# Patient Record
Sex: Female | Born: 1950 | Race: White | Hispanic: No | Smoking: Former smoker
Health system: Southern US, Community
[De-identification: ages and names within clinical notes are randomized; demographics above are authoritative.]

## PROBLEM LIST (undated history)

## (undated) ENCOUNTER — Emergency Department (HOSPITAL_COMMUNITY): Admission: EM | Discharge status: Absent without leave | Payer: Medicare Other

## (undated) DIAGNOSIS — J189 Pneumonia, unspecified organism: Secondary | ICD-10-CM

## (undated) DIAGNOSIS — D509 Iron deficiency anemia, unspecified: Secondary | ICD-10-CM

## (undated) DIAGNOSIS — E785 Hyperlipidemia, unspecified: Secondary | ICD-10-CM

## (undated) DIAGNOSIS — F3289 Other specified depressive episodes: Secondary | ICD-10-CM

## (undated) DIAGNOSIS — J449 Chronic obstructive pulmonary disease, unspecified: Secondary | ICD-10-CM

## (undated) DIAGNOSIS — G4700 Insomnia, unspecified: Secondary | ICD-10-CM

## (undated) DIAGNOSIS — G43009 Migraine without aura, not intractable, without status migrainosus: Secondary | ICD-10-CM

## (undated) DIAGNOSIS — R519 Headache, unspecified: Secondary | ICD-10-CM

## (undated) DIAGNOSIS — J309 Allergic rhinitis, unspecified: Secondary | ICD-10-CM

## (undated) DIAGNOSIS — J45909 Unspecified asthma, uncomplicated: Secondary | ICD-10-CM

## (undated) DIAGNOSIS — M810 Age-related osteoporosis without current pathological fracture: Secondary | ICD-10-CM

## (undated) DIAGNOSIS — M1990 Unspecified osteoarthritis, unspecified site: Secondary | ICD-10-CM

## (undated) DIAGNOSIS — F329 Major depressive disorder, single episode, unspecified: Secondary | ICD-10-CM

## (undated) DIAGNOSIS — I340 Nonrheumatic mitral (valve) insufficiency: Secondary | ICD-10-CM

## (undated) DIAGNOSIS — R51 Headache: Secondary | ICD-10-CM

## (undated) DIAGNOSIS — K5730 Diverticulosis of large intestine without perforation or abscess without bleeding: Secondary | ICD-10-CM

## (undated) DIAGNOSIS — F411 Generalized anxiety disorder: Secondary | ICD-10-CM

## (undated) DIAGNOSIS — E119 Type 2 diabetes mellitus without complications: Secondary | ICD-10-CM

## (undated) DIAGNOSIS — K219 Gastro-esophageal reflux disease without esophagitis: Secondary | ICD-10-CM

## (undated) DIAGNOSIS — G8929 Other chronic pain: Secondary | ICD-10-CM

## (undated) HISTORY — DX: Major depressive disorder, single episode, unspecified: F32.9

## (undated) HISTORY — DX: Generalized anxiety disorder: F41.1

## (undated) HISTORY — DX: Insomnia, unspecified: G47.00

## (undated) HISTORY — DX: Headache, unspecified: R51.9

## (undated) HISTORY — PX: COLONOSCOPY: SHX174

## (undated) HISTORY — DX: Type 2 diabetes mellitus without complications: E11.9

## (undated) HISTORY — DX: Unspecified osteoarthritis, unspecified site: M19.90

## (undated) HISTORY — DX: Nonrheumatic mitral (valve) insufficiency: I34.0

## (undated) HISTORY — DX: Allergic rhinitis, unspecified: J30.9

## (undated) HISTORY — DX: Pneumonia, unspecified organism: J18.9

## (undated) HISTORY — DX: Migraine without aura, not intractable, without status migrainosus: G43.009

## (undated) HISTORY — PX: APPENDECTOMY: SHX54

## (undated) HISTORY — PX: ABDOMINAL HYSTERECTOMY: SHX81

## (undated) HISTORY — DX: Gastro-esophageal reflux disease without esophagitis: K21.9

## (undated) HISTORY — DX: Headache: R51

## (undated) HISTORY — DX: Other specified depressive episodes: F32.89

## (undated) HISTORY — DX: Iron deficiency anemia, unspecified: D50.9

## (undated) HISTORY — DX: Other chronic pain: G89.29

## (undated) HISTORY — DX: Age-related osteoporosis without current pathological fracture: M81.0

## (undated) HISTORY — PX: CHOLECYSTECTOMY: SHX55

## (undated) HISTORY — DX: Chronic obstructive pulmonary disease, unspecified: J44.9

## (undated) HISTORY — DX: Hyperlipidemia, unspecified: E78.5

## (undated) HISTORY — DX: Diverticulosis of large intestine without perforation or abscess without bleeding: K57.30

## (undated) HISTORY — DX: Unspecified asthma, uncomplicated: J45.909

---

## 1998-02-15 ENCOUNTER — Other Ambulatory Visit: Admission: RE | Admit: 1998-02-15 | Discharge: 1998-02-15 | Payer: Self-pay | Admitting: Gynecology

## 1998-03-21 ENCOUNTER — Ambulatory Visit (HOSPITAL_COMMUNITY): Admission: RE | Admit: 1998-03-21 | Discharge: 1998-03-21 | Payer: Self-pay | Admitting: Family Medicine

## 1998-03-21 ENCOUNTER — Encounter: Payer: Self-pay | Admitting: Family Medicine

## 1998-06-15 ENCOUNTER — Ambulatory Visit (HOSPITAL_COMMUNITY): Admission: RE | Admit: 1998-06-15 | Discharge: 1998-06-15 | Payer: Self-pay | Admitting: Family Medicine

## 1998-06-15 ENCOUNTER — Encounter: Payer: Self-pay | Admitting: Family Medicine

## 1998-10-16 ENCOUNTER — Ambulatory Visit (HOSPITAL_COMMUNITY): Admission: RE | Admit: 1998-10-16 | Discharge: 1998-10-16 | Payer: Self-pay | Admitting: Family Medicine

## 1998-10-16 ENCOUNTER — Encounter: Payer: Self-pay | Admitting: Family Medicine

## 1999-02-19 ENCOUNTER — Ambulatory Visit (HOSPITAL_COMMUNITY): Admission: RE | Admit: 1999-02-19 | Discharge: 1999-02-19 | Payer: Self-pay | Admitting: Family Medicine

## 1999-02-19 ENCOUNTER — Encounter: Payer: Self-pay | Admitting: Family Medicine

## 1999-07-16 ENCOUNTER — Other Ambulatory Visit: Admission: RE | Admit: 1999-07-16 | Discharge: 1999-07-16 | Payer: Self-pay | Admitting: Gynecology

## 2000-10-14 ENCOUNTER — Other Ambulatory Visit: Admission: RE | Admit: 2000-10-14 | Discharge: 2000-10-14 | Payer: Self-pay | Admitting: Gynecology

## 2000-12-09 ENCOUNTER — Encounter: Payer: Self-pay | Admitting: Family Medicine

## 2000-12-09 ENCOUNTER — Ambulatory Visit (HOSPITAL_COMMUNITY): Admission: RE | Admit: 2000-12-09 | Discharge: 2000-12-09 | Payer: Self-pay | Admitting: Family Medicine

## 2000-12-25 ENCOUNTER — Encounter: Payer: Self-pay | Admitting: Family Medicine

## 2000-12-25 ENCOUNTER — Ambulatory Visit (HOSPITAL_COMMUNITY): Admission: RE | Admit: 2000-12-25 | Discharge: 2000-12-25 | Payer: Self-pay | Admitting: Family Medicine

## 2001-05-22 ENCOUNTER — Encounter: Payer: Self-pay | Admitting: Family Medicine

## 2001-05-22 ENCOUNTER — Ambulatory Visit (HOSPITAL_COMMUNITY): Admission: RE | Admit: 2001-05-22 | Discharge: 2001-05-22 | Payer: Self-pay | Admitting: Family Medicine

## 2001-10-15 ENCOUNTER — Other Ambulatory Visit: Admission: RE | Admit: 2001-10-15 | Discharge: 2001-10-15 | Payer: Self-pay | Admitting: Gynecology

## 2002-10-18 ENCOUNTER — Other Ambulatory Visit: Admission: RE | Admit: 2002-10-18 | Discharge: 2002-10-18 | Payer: Self-pay | Admitting: Gynecology

## 2003-08-20 ENCOUNTER — Ambulatory Visit (HOSPITAL_COMMUNITY): Admission: RE | Admit: 2003-08-20 | Discharge: 2003-08-20 | Payer: Self-pay | Admitting: Family Medicine

## 2004-03-02 ENCOUNTER — Other Ambulatory Visit: Admission: RE | Admit: 2004-03-02 | Discharge: 2004-03-02 | Payer: Self-pay | Admitting: Gynecology

## 2004-04-24 ENCOUNTER — Ambulatory Visit (HOSPITAL_COMMUNITY): Admission: RE | Admit: 2004-04-24 | Discharge: 2004-04-24 | Payer: Self-pay | Admitting: Family Medicine

## 2005-05-09 ENCOUNTER — Other Ambulatory Visit: Admission: RE | Admit: 2005-05-09 | Discharge: 2005-05-09 | Payer: Self-pay | Admitting: Gynecology

## 2006-05-08 ENCOUNTER — Ambulatory Visit (HOSPITAL_COMMUNITY): Admission: RE | Admit: 2006-05-08 | Discharge: 2006-05-08 | Payer: Self-pay | Admitting: Family Medicine

## 2006-05-28 ENCOUNTER — Ambulatory Visit: Payer: Self-pay | Admitting: Internal Medicine

## 2006-06-18 ENCOUNTER — Ambulatory Visit: Payer: Self-pay | Admitting: Internal Medicine

## 2006-06-24 ENCOUNTER — Ambulatory Visit: Payer: Self-pay | Admitting: Gastroenterology

## 2006-07-02 ENCOUNTER — Ambulatory Visit: Payer: Self-pay | Admitting: Gastroenterology

## 2006-07-02 LAB — HM COLONOSCOPY

## 2006-07-08 ENCOUNTER — Ambulatory Visit: Payer: Self-pay | Admitting: Internal Medicine

## 2006-07-08 ENCOUNTER — Other Ambulatory Visit: Admission: RE | Admit: 2006-07-08 | Discharge: 2006-07-08 | Payer: Self-pay | Admitting: Gynecology

## 2006-07-08 LAB — CONVERTED CEMR LAB
AST: 20 units/L (ref 0–37)
Albumin: 3.9 g/dL (ref 3.5–5.2)
Alkaline Phosphatase: 78 units/L (ref 39–117)
HDL: 67.7 mg/dL (ref >39.0)
Total Bilirubin: 0.6 mg/dL (ref 0.3–1.2)
Total CHOL/HDL Ratio: 2.4
Total CK: 103 units/L (ref 7–177)
Total Protein: 7.2 g/dL (ref 6.0–8.3)

## 2006-07-23 ENCOUNTER — Ambulatory Visit: Payer: Self-pay | Admitting: Internal Medicine

## 2007-01-24 ENCOUNTER — Encounter: Payer: Self-pay | Admitting: Internal Medicine

## 2007-01-24 DIAGNOSIS — G43009 Migraine without aura, not intractable, without status migrainosus: Secondary | ICD-10-CM | POA: Insufficient documentation

## 2007-01-24 DIAGNOSIS — J309 Allergic rhinitis, unspecified: Secondary | ICD-10-CM

## 2007-01-24 DIAGNOSIS — F329 Major depressive disorder, single episode, unspecified: Secondary | ICD-10-CM

## 2007-01-26 DIAGNOSIS — E785 Hyperlipidemia, unspecified: Secondary | ICD-10-CM | POA: Insufficient documentation

## 2007-02-24 ENCOUNTER — Ambulatory Visit: Payer: Self-pay | Admitting: Internal Medicine

## 2007-04-02 ENCOUNTER — Ambulatory Visit: Payer: Self-pay | Admitting: Internal Medicine

## 2007-04-02 LAB — CONVERTED CEMR LAB
Basophils Absolute: 0.3 10*3/uL — ABNORMAL HIGH (ref 0.0–0.1)
Basophils Relative: 4.5 % — ABNORMAL HIGH (ref 0.0–1.0)
CO2: 26 meq/L (ref 19–32)
Chloride: 104 meq/L (ref 96–112)
Eosinophils Absolute: 0.2 10*3/uL (ref 0.0–0.6)
Eosinophils Relative: 2.1 % (ref 0.0–5.0)
GFR calc Af Amer: 112 mL/min
Lymphocytes Relative: 30.9 % (ref 12.0–46.0)
Monocytes Relative: 5 % (ref 3.0–11.0)
Neutro Abs: 4.2 10*3/uL (ref 1.4–7.7)
Neutrophils Relative %: 57.5 % (ref 43.0–77.0)
RDW: 12.1 % (ref 11.5–14.6)
Sodium: 140 meq/L (ref 135–145)
TSH: 2.35 microintl units/mL (ref 0.35–5.50)

## 2007-04-03 ENCOUNTER — Encounter: Payer: Self-pay | Admitting: Internal Medicine

## 2007-04-30 ENCOUNTER — Ambulatory Visit: Payer: Self-pay | Admitting: Internal Medicine

## 2007-05-11 ENCOUNTER — Encounter: Payer: Self-pay | Admitting: Internal Medicine

## 2007-05-11 ENCOUNTER — Ambulatory Visit: Payer: Self-pay

## 2007-06-02 ENCOUNTER — Telehealth (INDEPENDENT_AMBULATORY_CARE_PROVIDER_SITE_OTHER): Payer: Self-pay | Admitting: *Deleted

## 2007-07-31 ENCOUNTER — Telehealth: Payer: Self-pay | Admitting: Internal Medicine

## 2007-08-06 ENCOUNTER — Encounter: Payer: Self-pay | Admitting: Internal Medicine

## 2007-11-17 ENCOUNTER — Ambulatory Visit: Payer: Self-pay | Admitting: Internal Medicine

## 2007-11-17 DIAGNOSIS — D509 Iron deficiency anemia, unspecified: Secondary | ICD-10-CM

## 2007-11-17 DIAGNOSIS — I10 Essential (primary) hypertension: Secondary | ICD-10-CM | POA: Insufficient documentation

## 2007-11-17 DIAGNOSIS — G4700 Insomnia, unspecified: Secondary | ICD-10-CM

## 2007-11-17 DIAGNOSIS — F411 Generalized anxiety disorder: Secondary | ICD-10-CM | POA: Insufficient documentation

## 2007-11-17 DIAGNOSIS — K5730 Diverticulosis of large intestine without perforation or abscess without bleeding: Secondary | ICD-10-CM | POA: Insufficient documentation

## 2008-01-04 ENCOUNTER — Telehealth (INDEPENDENT_AMBULATORY_CARE_PROVIDER_SITE_OTHER): Payer: Self-pay | Admitting: *Deleted

## 2008-01-08 ENCOUNTER — Ambulatory Visit: Payer: Self-pay | Admitting: Internal Medicine

## 2008-04-14 ENCOUNTER — Ambulatory Visit: Payer: Self-pay | Admitting: Internal Medicine

## 2008-05-06 ENCOUNTER — Ambulatory Visit: Payer: Self-pay | Admitting: Internal Medicine

## 2008-05-06 DIAGNOSIS — J209 Acute bronchitis, unspecified: Secondary | ICD-10-CM

## 2008-05-10 ENCOUNTER — Observation Stay (HOSPITAL_COMMUNITY): Admission: EM | Admit: 2008-05-10 | Discharge: 2008-05-11 | Payer: Self-pay | Admitting: Emergency Medicine

## 2008-05-10 ENCOUNTER — Ambulatory Visit: Payer: Self-pay | Admitting: Internal Medicine

## 2008-05-10 ENCOUNTER — Telehealth: Payer: Self-pay | Admitting: Internal Medicine

## 2008-05-10 ENCOUNTER — Ambulatory Visit: Payer: Self-pay | Admitting: Cardiovascular Disease

## 2008-05-11 ENCOUNTER — Encounter: Payer: Self-pay | Admitting: Internal Medicine

## 2008-05-18 ENCOUNTER — Ambulatory Visit: Payer: Self-pay | Admitting: Internal Medicine

## 2008-06-07 ENCOUNTER — Ambulatory Visit: Payer: Self-pay | Admitting: Pulmonary Disease

## 2008-06-07 DIAGNOSIS — R0602 Shortness of breath: Secondary | ICD-10-CM

## 2008-06-14 ENCOUNTER — Ambulatory Visit: Payer: Self-pay | Admitting: Pulmonary Disease

## 2008-06-16 ENCOUNTER — Ambulatory Visit (HOSPITAL_COMMUNITY): Admission: RE | Admit: 2008-06-16 | Discharge: 2008-06-16 | Payer: Self-pay | Admitting: Pulmonary Disease

## 2008-07-19 ENCOUNTER — Ambulatory Visit: Payer: Self-pay | Admitting: Pulmonary Disease

## 2008-07-19 DIAGNOSIS — J45991 Cough variant asthma: Secondary | ICD-10-CM

## 2008-08-08 ENCOUNTER — Encounter: Payer: Self-pay | Admitting: Internal Medicine

## 2008-08-11 ENCOUNTER — Telehealth (INDEPENDENT_AMBULATORY_CARE_PROVIDER_SITE_OTHER): Payer: Self-pay | Admitting: *Deleted

## 2008-11-03 ENCOUNTER — Telehealth (INDEPENDENT_AMBULATORY_CARE_PROVIDER_SITE_OTHER): Payer: Self-pay | Admitting: *Deleted

## 2008-12-05 ENCOUNTER — Encounter: Payer: Self-pay | Admitting: Internal Medicine

## 2009-01-25 ENCOUNTER — Ambulatory Visit: Payer: Self-pay | Admitting: Internal Medicine

## 2009-01-26 LAB — CONVERTED CEMR LAB
ALT: 28 units/L (ref 0–35)
Alkaline Phosphatase: 88 units/L (ref 39–117)
Calcium: 9.3 mg/dL (ref 8.4–10.5)
Cholesterol: 136 mg/dL (ref 0–200)
GFR calc non Af Amer: 68.5 mL/min (ref >60)
HDL: 64.6 mg/dL (ref >39.00)
Hemoglobin, Urine: NEGATIVE
Ketones, ur: NEGATIVE mg/dL
LDL Cholesterol: 45 mg/dL (ref 0–99)
Leukocytes, UA: NEGATIVE
Lymphs Abs: 2.3 10*3/uL (ref 0.7–4.0)
MCHC: 33.9 g/dL (ref 30.0–36.0)
Monocytes Absolute: 0.4 10*3/uL (ref 0.1–1.0)
Neutro Abs: 4.1 10*3/uL (ref 1.4–7.7)
Neutrophils Relative %: 57.7 % (ref 43.0–77.0)
Platelets: 292 10*3/uL (ref 150.0–400.0)
Potassium: 3.6 meq/L (ref 3.5–5.1)
RDW: 12.9 % (ref 11.5–14.6)
Sodium: 140 meq/L (ref 135–145)
Specific Gravity, Urine: >=1.03 (ref 1.000–1.030)
TSH: 1.75 microintl units/mL (ref 0.35–5.50)
Total Bilirubin: 0.7 mg/dL (ref 0.3–1.2)
Total CHOL/HDL Ratio: 2
Urine Glucose: NEGATIVE mg/dL
WBC: 7 10*3/uL (ref 4.5–10.5)

## 2009-02-03 ENCOUNTER — Encounter (INDEPENDENT_AMBULATORY_CARE_PROVIDER_SITE_OTHER): Payer: Self-pay | Admitting: *Deleted

## 2009-02-15 ENCOUNTER — Encounter: Payer: Self-pay | Admitting: Internal Medicine

## 2009-07-26 ENCOUNTER — Ambulatory Visit: Payer: Self-pay | Admitting: Internal Medicine

## 2009-07-26 DIAGNOSIS — R079 Chest pain, unspecified: Secondary | ICD-10-CM

## 2009-07-26 DIAGNOSIS — J069 Acute upper respiratory infection, unspecified: Secondary | ICD-10-CM | POA: Insufficient documentation

## 2009-07-27 ENCOUNTER — Encounter: Payer: Self-pay | Admitting: Internal Medicine

## 2009-07-27 ENCOUNTER — Telehealth: Payer: Self-pay | Admitting: Internal Medicine

## 2009-08-01 ENCOUNTER — Encounter: Payer: Self-pay | Admitting: Internal Medicine

## 2009-08-14 ENCOUNTER — Telehealth: Payer: Self-pay | Admitting: Internal Medicine

## 2009-11-01 ENCOUNTER — Telehealth: Payer: Self-pay | Admitting: Internal Medicine

## 2009-11-01 ENCOUNTER — Ambulatory Visit: Payer: Self-pay | Admitting: Internal Medicine

## 2009-11-29 ENCOUNTER — Telehealth: Payer: Self-pay | Admitting: Internal Medicine

## 2009-12-01 ENCOUNTER — Ambulatory Visit: Payer: Self-pay | Admitting: Internal Medicine

## 2009-12-01 DIAGNOSIS — S93409A Sprain of unspecified ligament of unspecified ankle, initial encounter: Secondary | ICD-10-CM | POA: Insufficient documentation

## 2009-12-01 DIAGNOSIS — R059 Cough, unspecified: Secondary | ICD-10-CM | POA: Insufficient documentation

## 2009-12-01 DIAGNOSIS — R05 Cough: Secondary | ICD-10-CM

## 2010-02-06 ENCOUNTER — Ambulatory Visit: Payer: Self-pay | Admitting: Internal Medicine

## 2010-03-19 ENCOUNTER — Ambulatory Visit: Payer: Self-pay | Admitting: Internal Medicine

## 2010-03-19 ENCOUNTER — Encounter (INDEPENDENT_AMBULATORY_CARE_PROVIDER_SITE_OTHER): Payer: Self-pay | Admitting: *Deleted

## 2010-03-19 DIAGNOSIS — R062 Wheezing: Secondary | ICD-10-CM

## 2010-04-04 ENCOUNTER — Telehealth: Payer: Self-pay | Admitting: Internal Medicine

## 2010-04-06 ENCOUNTER — Telehealth: Payer: Self-pay | Admitting: Internal Medicine

## 2010-04-06 ENCOUNTER — Encounter: Payer: Self-pay | Admitting: Internal Medicine

## 2010-04-06 ENCOUNTER — Ambulatory Visit: Payer: Self-pay | Admitting: Internal Medicine

## 2010-04-06 LAB — CONVERTED CEMR LAB
BUN: 13 mg/dL (ref 6–23)
Basophils Absolute: 0.1 10*3/uL (ref 0.0–0.1)
CO2: 33 meq/L — ABNORMAL HIGH (ref 19–32)
Calcium: 9.5 mg/dL (ref 8.4–10.5)
Chloride: 103 meq/L (ref 96–112)
Creatinine, Ser: 0.8 mg/dL (ref 0.4–1.2)
Eosinophils Absolute: 0.2 10*3/uL (ref 0.0–0.7)
Eosinophils Relative: 2.6 % (ref 0.0–5.0)
HCT: 38.9 % (ref 36.0–46.0)
Hemoglobin, Urine: NEGATIVE
Leukocytes, UA: NEGATIVE
Lymphocytes Relative: 34.8 % (ref 12.0–46.0)
MCV: 88.4 fL (ref 78.0–100.0)
Monocytes Relative: 7.5 % (ref 3.0–12.0)
Neutro Abs: 4.8 10*3/uL (ref 1.4–7.7)
Nitrite: NEGATIVE
Saturation Ratios: 16.6 % — ABNORMAL LOW (ref 20.0–50.0)
Sodium: 143 meq/L (ref 135–145)
Total Protein, Urine: NEGATIVE mg/dL
Transferrin: 280.4 mg/dL (ref 212.0–360.0)
Urine Glucose: NEGATIVE mg/dL
VLDL: 67.8 mg/dL — ABNORMAL HIGH (ref 0.0–40.0)

## 2010-04-09 ENCOUNTER — Encounter: Payer: Self-pay | Admitting: Internal Medicine

## 2010-04-10 ENCOUNTER — Ambulatory Visit: Payer: Self-pay | Admitting: Internal Medicine

## 2010-04-11 ENCOUNTER — Encounter: Payer: Self-pay | Admitting: Internal Medicine

## 2010-04-18 ENCOUNTER — Telehealth (INDEPENDENT_AMBULATORY_CARE_PROVIDER_SITE_OTHER): Payer: Self-pay | Admitting: Radiology

## 2010-04-23 ENCOUNTER — Encounter: Payer: Self-pay | Admitting: Internal Medicine

## 2010-04-23 ENCOUNTER — Ambulatory Visit: Payer: Self-pay

## 2010-04-23 ENCOUNTER — Ambulatory Visit (HOSPITAL_COMMUNITY): Admission: RE | Admit: 2010-04-23 | Discharge: 2010-04-23 | Payer: Self-pay | Admitting: Internal Medicine

## 2010-04-23 ENCOUNTER — Encounter: Payer: Self-pay | Admitting: Cardiology

## 2010-04-23 ENCOUNTER — Ambulatory Visit: Payer: Self-pay | Admitting: Internal Medicine

## 2010-04-23 ENCOUNTER — Encounter (HOSPITAL_COMMUNITY)
Admission: RE | Admit: 2010-04-23 | Discharge: 2010-06-26 | Payer: Self-pay | Source: Home / Self Care | Attending: Internal Medicine | Admitting: Internal Medicine

## 2010-04-24 ENCOUNTER — Ambulatory Visit: Payer: Self-pay | Admitting: Internal Medicine

## 2010-04-25 ENCOUNTER — Ambulatory Visit: Payer: Self-pay | Admitting: Infectious Diseases

## 2010-04-25 LAB — CONVERTED CEMR LAB
Basophils Relative: 0 % (ref 0–1)
Compl, Total (CH50): >60 — ABNORMAL HIGH (ref 31–60)
Crypto Ag: NEGATIVE
Eosinophils Absolute: 0.2 10*3/uL (ref 0.0–0.7)
HCT: 40.8 % (ref 36.0–46.0)
Hemoglobin: 13.1 g/dL (ref 12.0–15.0)
IgA: 234 mg/dL (ref 68–378)
Lymphocytes Relative: 36 % (ref 12–46)
MCHC: 32.1 g/dL (ref 30.0–36.0)
Neutro Abs: 3.7 10*3/uL (ref 1.7–7.7)
RBC: 4.56 M/uL (ref 3.87–5.11)
WBC: 6.9 10*3/uL (ref 4.0–10.5)

## 2010-04-27 ENCOUNTER — Ambulatory Visit: Payer: Self-pay | Admitting: Cardiovascular Disease

## 2010-04-27 ENCOUNTER — Telehealth: Payer: Self-pay | Admitting: Internal Medicine

## 2010-05-16 ENCOUNTER — Telehealth: Payer: Self-pay | Admitting: Internal Medicine

## 2010-06-04 ENCOUNTER — Ambulatory Visit
Admission: RE | Admit: 2010-06-04 | Discharge: 2010-06-04 | Payer: Self-pay | Source: Home / Self Care | Attending: Infectious Diseases | Admitting: Infectious Diseases

## 2010-06-28 NOTE — Medication Information (Signed)
Summary: Prior Authorization/medco  Prior Authorization/medco   Imported By: Lester [place] 08/03/2009 12:29:03  _____________________________________________________________________  External Attachment:    Type:   Image     Comment:   External Document

## 2010-06-28 NOTE — Progress Notes (Signed)
Summary: Fexofenadine PA  Phone Note From Pharmacy   Summary of Call: PA request--Fexofenadine. Would you like to change pt to a form of Loratadine or Cetrizine or initiate PA? Please advise. Initial call taken by: Lucious Groves CMA,  July 27, 2009 12:09 PM  Follow-up for Phone Call        ok to initiate PA - to [facility] Follow-up by: Corwin Levins MD,  July 27, 2009 5:33 PM  Additional Follow-up for Phone Call Additional follow up Details #1::        Forms received,pending completion. Additional Follow-up by: Lucious Groves,  July 28, 2009 10:28 AM    Additional Follow-up for Phone Call Additional follow up Details #2::    PA has been completed and faxed. We will wait for insurance company reply. Follow-up by: Lucious Groves,  August 01, 2009 9:38 AM   Appended Document: Fexofenadine PA Per [email or web address] prescription is approved until 2012.

## 2010-06-28 NOTE — Assessment & Plan Note (Signed)
Summary: Pulmonary consultation/ cough > Sinus ct next    Copy to:  Dr. Jonny Ruiz Primary [name]/Referring [name]:  Dr. Jonny Ruiz  CC:  CP and Dyspnea.  History of Present Illness: 89 yowf quit smoking around 2000 with no tendency to bronchitis .   April 24, 2010  1st pulmonary office eval cc never compltely better x 2 years when had illness in [place] always cough some mostly dry, ex tol not back to normal more due to fatigue than sob,  cough is better while sleeping,  assoc with chest pain with coughing = front and back slt worse R than Left,  assoc with  occ streaky hemoptysis x sev weeks. Pt denies any significant sore throat, dysphagia, itching, sneezing,  nasal congestion or excess secretions,  fever, chills, sweats, unintended wt loss, pleuritic or exertional cp,  variability  in activity tolerance  orthopnea pnd or leg swelling. Pt also denies any obvious fluctuation in symptoms with weather or environmental change or other alleviating or aggravating factors.       Current Medications (verified): 1)  Simvastatin 40 Mg Tabs (Simvastatin) .Marland Kitchen.. 1 By Mouth Once Daily 2)  Fiorinal 50-325-40 Mg  Caps (Butalbital-Aspirin-Caffeine) .Marland Kitchen.. 1 By Mouth Once Daily As Needed 3)  Amitriptyline Hcl 50 Mg  Tabs (Amitriptyline Hcl) .Marland Kitchen.. 1 By Mouth At Bedtime As Needed- 4)  Fexofenadine Hcl 180 Mg Tabs (Fexofenadine Hcl) .Marland Kitchen.. 1 By Mouth Once Daily As Needed  Allergies (verified): 1)  ! * Trazadone  Past History:  Past Medical History: Allergic rhinitis Depression Migraine Hyperlipidemia Hypertension Diverticulosis, colon [name] Anxiety Mild mitral regurgitation, Echo 05/11/08 Dyspnea/ cough..................................................Marland KitchenWert      - Neg w/u in 2008       - PFT's 04/30/07 FEV1  2.54 (115%) ratio 78,  DLCO 83%  Family History: heart disease in father and brother Negative for respiratory diseases or atopy   Social History: Former Smoker. Quit in 1999.   Smoked x 20 yrs up 1 ppd. Admin assistant for [employer] Divorced 3 children Alcohol use-yes Drug use-no  Review of Systems       The patient complains of shortness of breath with activity, shortness of breath at rest, productive cough, non-productive cough, coughing up blood, chest pain, and headaches.  The patient denies irregular heartbeats, acid heartburn, indigestion, loss of appetite, weight change, abdominal pain, difficulty swallowing, sore throat, tooth/dental problems, nasal congestion/difficulty breathing through nose, sneezing, itching, ear ache, anxiety, depression, hand/feet swelling, joint stiffness or pain, rash, change in color of mucus, and fever.    Vital Signs:  Patient profile:   60 year old female Weight:      146.38 pounds O2 Sat:      96 % on Room air Temp:     97.8 degrees F oral Pulse rate:   83 / minute BP sitting:   120 / 60  (left arm)  Vitals Entered By: Vernie Murders (April 24, 2010 11:40 AM)  O2 Flow:  Room air  Physical Exam  Additional Exam:  wt 141 > 146 April 24, 2010 HEENT: nl dentition, turbinates, and orophanx. Nl external ear canals without cough reflex NECK :  without JVD/Nodes/TM/ nl carotid upstrokes bilaterally LUNGS: no acc muscle use, clear to A and P bilaterally without cough on insp or exp maneuvers CV:  RRR  no s3 or murmur or increase in P2, no edema  ABD:  soft and nontender with nl excursion in the supine position. No bruits or organomegaly, bowel sounds nl MS:  warm without deformities, calf tenderness, cyanosis or clubbing SKIN: warm and dry without lesions   NEURO:  alert, approp, no deficits   Sed Rate                  18 mm/hr                    0-22  CXR  Procedure date:  04/24/2010  Findings:      Comparison: 04/06/2010   Findings: Heart and mediastinal contours are within normal limits. No focal opacities or effusions.  No acute bony abnormality.   IMPRESSION: No active disease.  Impression &  Recommendations:  Problem # 1:  COUGH (ICD-786.2) The most common causes of chronic cough in immunocompetent adults include: upper airway cough syndrome (UACS), previously referred to as postnasal drip syndrome,  caused by variety of rhinosinus conditions; (2) asthma; (3) GERD; (4) chronic bronchitis from cigarette smoking or other inhaled environmental irritants; (5) nonasthmatic eosinophilic bronchitis; and (6) bronchiectasis. These conditions, singly or in combination, have accounted for up to 94% of the causes of chronic cough in prospective studies.   This is most c/w  Classic Upper airway cough syndrome, so named because it's frequently impossible to sort out how much is  CR/sinusitis with freq throat clearing (which can be related to primary GERD)   vs  causing  secondary extra esophageal GERD from wide swings in gastric pressure that occur with throat clearing, promoting self use of mint and menthol lozenges that reduce the lower esophageal sphincter tone and exacerbate the problem further These are the same pts who not infrequently have failed to tolerate ace inhibitors,  dry powder inhalers or biphosphonates or report having reflux symptoms that don't respond to standard doses of PPI   Rec proceed with sinus ct and GERD Diet / allergy w/u  Problem # 2:  ASTHMA (ICD-493.90) When respiratory symptoms begin well after a patient reports complete smoking cessation,  it is very hard to "blame" primary airways dz as the cause   ie it doesn't make any more sense than hearing a  [employer]SC[employer] driver wrecked his car while driving his kids to school or a Careers adviser sliced his hand off carving [place].  Once the high risk activity stops,  the symptoms should not suddenly erupt.  If so, the differential diagnosis should include  obesity/deconditioning,  LPR/Reflux, sinusitis,  CHF, or side effect of medications esp ace, biphophonates, beta blockers etc.  Strongly doubt this is asthma and no evidence of copd by prev  w/u 8 years p she quit smoking so no need for rx - could do methacholine challenge as part of the w/u for cough but not a first step for sure  Other Orders: T-Allergy Profile [place], [place], [place], [place], [place] [phone]) TLB-Sedimentation Rate (ESR) (85652-ESR) Consultation Level V (99245) T-2 View CXR (71020TC) Misc. Referral (Misc. Ref)  Patient Instructions: 1)  Keep appointment with Dr Ninetta Lights 2)  See Patient Care Coordinator before leaving for sinus ct scan 3)  GERD (REFLUX)  is a common cause of respiratory symptoms. It commonly presents without heartburn and can be treated with medication, but also with lifestyle changes including avoidance of late meals, excessive alcohol, smoking cessation, and avoid fatty foods, chocolate, peppermint, colas, red wine, and acidic juices such as orange juice. NO MINT OR MENTHOL PRODUCTS SO NO COUGH DROPS  4)  USE SUGARLESS CANDY INSTEAD (jolley ranchers)  5)  NO OIL BASED VITAMINS

## 2010-06-28 NOTE — Progress Notes (Signed)
Summary: referral  Phone Note Call from Patient Call back at [phone]   Caller: Patient Summary of Call: pt is requesting a referral to pulmonary.  Ferdie Ping called her and said she couldn't get into infectious disease until 04/25/10.  Initial call taken by: Alysia Penna,  April 06, 2010 8:46 AM  Follow-up for Phone Call        ok to refer to pulm which I think is likely more approp, then could cancel ID appt if felt after that not needed Follow-up by: Corwin Levins MD,  April 06, 2010 8:56 AM  Additional Follow-up for Phone Call Additional follow up Details #1::        Pt informed of appt, will expect call from [facility] with appt info Additional Follow-up by: Alysia Penna,  April 06, 2010 9:15 AM

## 2010-06-28 NOTE — Assessment & Plan Note (Signed)
Summary: fatigue,vomiting-lb   Vital Signs:  Patient profile:   60 year old female Height:      61 inches Weight:      145.50 pounds BMI:     27.59 O2 Sat:      96 % on Room air Temp:     98.4 degrees F oral Pulse rate:   92 / minute BP sitting:   114 / 76  (left arm) Cuff size:   regular  Vitals Entered ByZella Ball Ewing (July 26, 2009 3:05 PM)  O2 Flow:  Room air CC: fatigue, right shoulder pain/RE   Primary Care Provider:  Dr. Jonny Ruiz  CC:  fatigue and right shoulder pain/RE.  History of Present Illness: here with 2-3 days acute onset moderate fatigue, low grade temp, sinus congestion acute greenish on more chronic nasal allergic clearish d/c;  as well as mild ST, with occasional chest achy pain to the right upper chest with tenderness at the sight, non pleuritic;  not worse with right shoudler movement  and no other neck or upper back or shoulder pain.  Pt denies other CP, sob, doe, wheezing, orthopnea, pnd, worsening LE edema, palps, dizziness or syncope .  Pt denies new neuro symptoms such as headache, facial or extremity weakness   Problems Prior to Update: 1)  Chest Pain  (ICD-786.50) 2)  Uri  (ICD-465.9) 3)  Preventive Health Care  (ICD-V70.0) 4)  Asthma  (ICD-493.90) 5)  Dyspnea  (ICD-786.05) 6)  Asthmatic Bronchitis, Acute  (ICD-466.0) 7)  [name]  (ICD-780.52) 8)  Anxiety  (ICD-300.00) 9)  [name]  (ICD-280.9) 10)  Diverticulosis, Colon  (ICD-562.10) 11)  Hypertension  (ICD-401.9) 12)  Hyperlipidemia  (ICD-272.4) 13)  Common Migraine  (ICD-346.10) 14)  Depression  (ICD-311) 15)  Allergic Rhinitis  (ICD-477.9)  Medications Prior to Update: 1)  Simvastatin 80 Mg Tabs (Simvastatin) .... Take 1 Tablet By Mouth Once A Day 2)  Estraderm 0.05 Mg/24hr Pttw (Estradiol) .... Use Asd 3)  Fiorinal 50-325-40 Mg  Caps (Butalbital-Aspirin-Caffeine) .Marland Kitchen.. 1 By Mouth Once Daily As Needed 4)  Caltrate .... Take 1 Tablet By Mouth Once A  Day 5)  Amitriptyline Hcl 50 Mg  Tabs (Amitriptyline Hcl) .Marland Kitchen.. 1 By Mouth At Bedtime As Needed- 6)  Alprazolam 0.25 Mg Tabs (Alprazolam) .Marland Kitchen.. 1 By Mouth Once Daily As Needed 7)  Qvar 80 Mcg/act Aers (Beclomethasone Dipropionate) .... Two Puffs Two Times A Day 8)  Proair Hfa 108 (90 Base) Mcg/act Aers (Albuterol Sulfate) .... Two Puffs Up To Four Times Per Day As Needed 9)  Mefloquine Hcl 250 Mg Tabs (Mefloquine Hcl) .Marland Kitchen.. 1 By Mouth Q Wk For 4 Wks (Starting 1 Wk Prior To Travel) 10)  Singulair 10 Mg Tabs (Montelukast Sodium) .Marland Kitchen.. 1 By Mouth Once Daily  Current Medications (verified): 1)  Simvastatin 80 Mg Tabs (Simvastatin) .... Take 1 Tablet By Mouth Once A Day 2)  Estraderm 0.05 Mg/24hr Pttw (Estradiol) .... Use Asd 3)  Fiorinal 50-325-40 Mg  Caps (Butalbital-Aspirin-Caffeine) .Marland Kitchen.. 1 By Mouth Once Daily As Needed 4)  Caltrate .... Take 1 Tablet By Mouth Once A Day 5)  Amitriptyline Hcl 50 Mg  Tabs (Amitriptyline Hcl) .Marland Kitchen.. 1 By Mouth At Bedtime As Needed- 6)  Alprazolam 0.25 Mg Tabs (Alprazolam) .Marland Kitchen.. 1 By Mouth Once Daily As Needed 7)  Qvar 80 Mcg/act Aers (Beclomethasone Dipropionate) .... Two Puffs Two Times A Day 8)  Proair Hfa 108 (90 Base) Mcg/act Aers (Albuterol Sulfate) .... Two Puffs Up To Four  Times Per Day As Needed 9)  Singulair 10 Mg Tabs (Montelukast Sodium) .Marland Kitchen.. 1 By Mouth Once Daily 10)  Fexofenadine Hcl 180 Mg Tabs (Fexofenadine Hcl) .Marland Kitchen.. 1 By Mouth Once Daily As Needed 11)  Azithromycin 250 Mg Tabs (Azithromycin) .... 2po Qd For 1 Day, Then 1po Qd For 4days, Then Stop  Allergies (verified): 1)  ! 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 , mild ill  Head:  normocephalic and atraumatic.   Eyes:  vision grossly intact, pupils equal, and pupils round.   Ears:  bilat tm's erythema, sinus nontender Nose:  nasal dischargemucosal pallor and mucosal edema.   Mouth:  pharyngeal erythema and fair dentition.   Neck:  supple and cervical lymphadenopathy.   Lungs:  normal respiratory effort and normal breath sounds.   Heart:  normal rate and regular rhythm.   Msk:  right shoulder nontender and FROM, has some right upper chest wall tenderness Extremities:  no edema, no erythema  Neurologic:  cranial nerves II-XII intact and strength normal in all extremities.     Impression & Recommendations:  Problem # 1:  URI (ICD-465.9)  Her updated medication list for this problem includes:    Fexofenadine Hcl 180 Mg Tabs (Fexofenadine hcl) .Marland Kitchen... 1 by mouth once daily as needed treat as above, f/u any worsening signs or symptoms , also with zpack x 1  Problem # 2:  ASTHMA (ICD-493.90)  Her updated medication list for this problem includes:    Qvar 80 Mcg/act Aers (Beclomethasone dipropionate) .Marland Kitchen..Marland Kitchen Two puffs two times a day    Proair Hfa 108 (90 Base) Mcg/act Aers (Albuterol sulfate) .Marland Kitchen..Marland Kitchen Two puffs up to four times per day as needed    Singulair 10 Mg Tabs (Montelukast sodium) .Marland Kitchen... 1 by mouth once daily stable overall by hx and exam, ok to continue meds/tx as is   Problem # 3:  ALLERGIC RHINITIS (ICD-477.9)  Her updated medication list for this problem includes:    Fexofenadine Hcl 180 Mg Tabs (Fexofenadine hcl) .Marland Kitchen... 1 by mouth once daily as needed treat as above, f/u any worsening signs or symptoms   Problem # 4:  CHEST PAIN  (ICD-786.50) near right shoudler suspect MSK - for ecg and cxr, quit smoking 11 yrs ago, o/w expectant management  Orders: EKG w/ Interpretation (93000) T-2 View CXR, Same Day (71020.5TC)  Complete Medication List: 1)  Simvastatin 80 Mg Tabs (Simvastatin) .... Take 1 tablet by mouth once a day 2)  Estraderm 0.05 Mg/24hr Pttw (Estradiol) .... Use asd 3)  Fiorinal 50-325-40 Mg Caps (Butalbital-aspirin-caffeine) .Marland Kitchen.. 1 by mouth once daily as needed 4)  Caltrate  .... Take 1 tablet by mouth once a day 5)  Amitriptyline Hcl 50 Mg Tabs (Amitriptyline hcl) .Marland Kitchen.. 1 by mouth at bedtime as needed- 6)  Alprazolam 0.25 Mg Tabs (Alprazolam) .Marland Kitchen.. 1 by mouth once daily as needed 7)  Qvar 80 Mcg/act Aers (Beclomethasone dipropionate) .... Two puffs two times a day 8)  Proair Hfa 108 (90 Base) Mcg/act Aers (Albuterol sulfate) .... Two puffs up to four times per day as needed 9)  Singulair 10 Mg Tabs (Montelukast sodium) .Marland Kitchen.. 1 by mouth once daily 10)  Fexofenadine Hcl 180 Mg Tabs (Fexofenadine hcl) .Marland Kitchen.. 1 by mouth once daily as needed 11)  Azithromycin 250 Mg Tabs (Azithromycin) .... 2po qd for 1 day, then 1po qd for 4days, then stop  Patient Instructions: 1)  Please take all new medications as prescribed 2)  Continue all previous medications as before this visit  3)  Your EKG was good 4)  Please go to Radiology in the basement level for your X-Ray today  5)  Please schedule a follow-up appointment in 6 months with CPX labs Prescriptions: AZITHROMYCIN 250 MG TABS (AZITHROMYCIN) 2po qd for 1 day, then 1po qd for 4days, then stop  #6 x 1   Entered and Authorized by:   Corwin Levins MD   Signed by:   Corwin Levins MD on 07/26/2009   Method used:   Print then Give to Patient   RxID:   [id number] FEXOFENADINE HCL 180 MG TABS (FEXOFENADINE HCL) 1 by mouth once daily as needed  #90 x 3   Entered and Authorized by:   Corwin Levins MD   Signed by:   Corwin Levins MD on 07/26/2009   Method used:   Print  then Give to Patient   RxID:   [id number]

## 2010-06-28 NOTE — Medication Information (Signed)
Summary: Approved/medco  Approved/medco   Imported By: Lester [place] 08/29/2009 09:51:01  _____________________________________________________________________  External Attachment:    Type:   Image     Comment:   External Document

## 2010-06-28 NOTE — Progress Notes (Signed)
Summary: ALT Med  Phone Note From Pharmacy   Caller: [facility]* Summary of Call: Pharmacy called stating that Cefaclor is not available. Pharmacy is requesting alternative Rx - Ceflex or Ceftin. Initial call taken by: Margaret Pyle, CMA,  November 01, 2009 1:52 PM  Follow-up for Phone Call        ceftin is ok - 250 two times a day for 10 days - to robin to handle Follow-up by: Corwin Levins MD,  November 01, 2009 2:15 PM    New/Updated Medications: CEFTIN 250 MG TABS (CEFUROXIME AXETIL) 1 by mouth two times a day for ten days Prescriptions: CEFTIN 250 MG TABS (CEFUROXIME AXETIL) 1 by mouth two times a day for ten days  ##20 x 0   Entered by:   Scharlene Gloss   Authorized by:   Corwin Levins MD   Signed by:   Scharlene Gloss on 11/01/2009   Method used:   Faxed to ...       [facility]* (retail)       [address]       [place], [place]  [id number]       Ph: [phone]       Fax: [phone]   RxID:   [id number]

## 2010-06-28 NOTE — Assessment & Plan Note (Signed)
Summary: sore throat/cd   Vital Signs:  Patient profile:   60 year old female Height:      61 inches Weight:      141.50 pounds BMI:     26.83 O2 Sat:      96 % on Room air Temp:     98.6 degrees F oral Pulse rate:   98 / minute BP sitting:   102 / 60  (left arm) Cuff size:   regular  Vitals Entered ByZella Ball Ewing (November 01, 2009 11:09 AM)  O2 Flow:  Room air CC: sore throat, cough, congestion for 3 days/RE   Primary Care Provider:  Dr. Jonny Ruiz  CC:  sore throat, cough, and congestion for 3 days/RE.  History of Present Illness: here with acute onset x 3 days, fever, ST and prod cough greenish sputum, with mild headache and general weakness;  but Pt denies CP, sob, doe, wheezing, orthopnea, pnd, worsening LE edema, palps, dizziness or syncope   Pt denies new neuro symptoms such as headache, facial or extremity weakness   Not sleeping as well last night due to illness, but no increased depressive symptoms or panic.    Preventive Screening-Counseling & Management      Drug Use:  no.    Problems Prior to Update: 1)  Bronchitis-acute  (ICD-466.0) 2)  Chest Pain  (ICD-786.50) 3)  Uri  (ICD-465.9) 4)  Preventive Health Care  (ICD-V70.0) 5)  Asthma  (ICD-493.90) 6)  Dyspnea  (ICD-786.05) 7)  Asthmatic Bronchitis, Acute  (ICD-466.0) 8)  [name]  (ICD-780.52) 9)  Anxiety  (ICD-300.00) 10)  [name]  (ICD-280.9) 11)  Diverticulosis, Colon  (ICD-562.10) 12)  Hypertension  (ICD-401.9) 13)  Hyperlipidemia  (ICD-272.4) 14)  Common Migraine  (ICD-346.10) 15)  Depression  (ICD-311) 16)  Allergic Rhinitis  (ICD-477.9)  Medications Prior to Update: 1)  Simvastatin 80 Mg Tabs (Simvastatin) .... Take 1 Tablet By Mouth Once A Day 2)  Estraderm 0.05 Mg/24hr Pttw (Estradiol) .... Use Asd 3)  Fiorinal 50-325-40 Mg  Caps (Butalbital-Aspirin-Caffeine) .Marland Kitchen.. 1 By Mouth Once Daily As Needed 4)  Caltrate .... Take 1 Tablet By Mouth Once A Day 5)   Amitriptyline Hcl 50 Mg  Tabs (Amitriptyline Hcl) .Marland Kitchen.. 1 By Mouth At Bedtime As Needed- 6)  Alprazolam 0.25 Mg Tabs (Alprazolam) .Marland Kitchen.. 1 By Mouth Once Daily As Needed 7)  Qvar 80 Mcg/act Aers (Beclomethasone Dipropionate) .... Two Puffs Two Times A Day 8)  Proair Hfa 108 (90 Base) Mcg/act Aers (Albuterol Sulfate) .... Two Puffs Up To Four Times Per Day As Needed 9)  Singulair 10 Mg Tabs (Montelukast Sodium) .Marland Kitchen.. 1 By Mouth Once Daily 10)  Fexofenadine Hcl 180 Mg Tabs (Fexofenadine Hcl) .Marland Kitchen.. 1 By Mouth Once Daily As Needed 11)  Azithromycin 250 Mg Tabs (Azithromycin) .... 2po Qd For 1 Day, Then 1po Qd For 4days, Then Stop  Current Medications (verified): 1)  Simvastatin 80 Mg Tabs (Simvastatin) .... Take 1 Tablet By Mouth Once A Day 2)  Estraderm 0.05 Mg/24hr Pttw (Estradiol) .... Use Asd 3)  Fiorinal 50-325-40 Mg  Caps (Butalbital-Aspirin-Caffeine) .Marland Kitchen.. 1 By Mouth Once Daily As Needed 4)  Caltrate .... Take 1 Tablet By Mouth Once A Day 5)  Amitriptyline Hcl 50 Mg  Tabs (Amitriptyline Hcl) .Marland Kitchen.. 1 By Mouth At Bedtime As Needed- 6)  Alprazolam 0.25 Mg Tabs (Alprazolam) .Marland Kitchen.. 1 By Mouth Once Daily As Needed 7)  Qvar 80 Mcg/act Aers (Beclomethasone Dipropionate) .... Two Puffs Two Times A Day  8)  Proair Hfa 108 (90 Base) Mcg/act Aers (Albuterol Sulfate) .... Two Puffs Up To Four Times Per Day As Needed 9)  Singulair 10 Mg Tabs (Montelukast Sodium) .Marland Kitchen.. 1 By Mouth Once Daily 10)  Fexofenadine Hcl 180 Mg Tabs (Fexofenadine Hcl) .Marland Kitchen.. 1 By Mouth Once Daily As Needed 11)  Cefaclor 250 Mg Caps (Cefaclor) .Marland Kitchen.. 1po Three Times A Day 12)  Hydrocodone-Homatropine 5-1.5 Mg/17ml Syrp (Hydrocodone-Homatropine) .Marland Kitchen.. 1 Tsp By Mouth Q 6 Hrs As Needed Cough 13)  Ceftin 250 Mg Tabs (Cefuroxime Axetil) .Marland Kitchen.. 1 By Mouth Two Times A Day For Ten Days  Allergies (verified): 1)  ! 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   mild ill  Head:  normocephalic and atraumatic.   Eyes:  vision grossly intact, pupils equal, and pupils round.   Ears:  bilat tm's mild red, sinus nontender Nose:  nasal dischargemucosal pallor and mucosal edema.   Mouth:  pharyngeal erythema and fair dentition.   Neck:  supple and cervical lymphadenopathy.   Lungs:  normal respiratory effort and normal breath sounds.   - no wheezing or reduced BS Heart:  normal rate and regular rhythm.   Extremities:  no edema, no erythema    Impression & Recommendations:  Problem # 1:  BRONCHITIS-ACUTE (ICD-466.0)  Her updated medication list for this problem includes:    Qvar 80 Mcg/act Aers (Beclomethasone dipropionate) .Marland Kitchen..Marland Kitchen Two puffs two times a day    Proair Hfa 108 (90 Base) Mcg/act Aers (Albuterol sulfate) .Marland Kitchen..Marland Kitchen Two puffs up to four times per day as needed    Singulair 10 Mg Tabs (Montelukast sodium) .Marland Kitchen... 1 by mouth once daily    Cefaclor 250 Mg Caps (Cefaclor) .Marland Kitchen... 1po three times a day    Hydrocodone-homatropine 5-1.5 Mg/59ml Syrp (Hydrocodone-homatropine) .Marland Kitchen... 1 tsp by mouth q 6 hrs as needed cough    Ceftin 250 Mg Tabs (Cefuroxime axetil) .Marland Kitchen... 1 by mouth two times a day for ten days  Orders: T-2 View  CXR, Same Day (71020.5TC) cant r/o pna - check cxr, treat as above, f/u any worsening signs or symptoms   Problem # 2:  ASTHMA (ICD-493.90)  Her updated medication list for this problem includes:    Qvar 80 Mcg/act Aers (Beclomethasone dipropionate) .Marland Kitchen..Marland Kitchen Two puffs two times a day    Proair Hfa 108 (90 Base) Mcg/act Aers (Albuterol sulfate) .Marland Kitchen..Marland Kitchen Two puffs up to four times per day as needed    Singulair 10 Mg Tabs (Montelukast sodium) .Marland Kitchen... 1 by mouth once daily stable overall by hx and exam, ok to continue meds/tx as is   Problem # 3:  HYPERTENSION (ICD-401.9)  BP today: 102/60 Prior BP: 114/76 (07/26/2009)  Labs Reviewed: K+: 3.6 (01/25/2009) Creat: : 0.9 (01/25/2009)   Chol: 136 (01/25/2009)   HDL: 64.60 (01/25/2009)   LDL: 45 (  01/25/2009)   TG: 134.0 (01/25/2009) stable overall by hx and exam, ok to continue meds/tx as is , no need for meds, to cont wt and diet  Problem # 4:  ANXIETY (ICD-300.00)  Her updated medication list for this problem includes:    Amitriptyline Hcl 50 Mg Tabs (Amitriptyline hcl) .Marland Kitchen... 1 by mouth at bedtime as needed-    Alprazolam 0.25 Mg Tabs (Alprazolam) .Marland Kitchen... 1 by mouth once daily as needed stable overall by hx and exam, ok to continue meds/tx as is   Complete Medication List: 1)  Simvastatin 80 Mg Tabs (Simvastatin) .... Take 1 tablet by mouth once a day 2)  Estraderm 0.05 Mg/24hr Pttw (Estradiol) .... Use asd 3)  Fiorinal 50-325-40 Mg Caps (Butalbital-aspirin-caffeine) .Marland Kitchen.. 1 by mouth once daily as needed 4)  Caltrate  .... Take 1 tablet by mouth once a day 5)  Amitriptyline Hcl 50 Mg Tabs (Amitriptyline hcl) .Marland Kitchen.. 1 by mouth at bedtime as needed- 6)  Alprazolam 0.25 Mg Tabs (Alprazolam) .Marland Kitchen.. 1 by mouth once daily as needed 7)  Qvar 80 Mcg/act Aers (Beclomethasone dipropionate) .... Two puffs two times a day 8)  Proair Hfa 108 (90 Base) Mcg/act Aers (Albuterol sulfate) .... Two puffs up to four times per day as needed 9)  Singulair 10 Mg Tabs  (Montelukast sodium) .Marland Kitchen.. 1 by mouth once daily 10)  Fexofenadine Hcl 180 Mg Tabs (Fexofenadine hcl) .Marland Kitchen.. 1 by mouth once daily as needed 11)  Cefaclor 250 Mg Caps (Cefaclor) .Marland Kitchen.. 1po three times a day 12)  Hydrocodone-homatropine 5-1.5 Mg/15ml Syrp (Hydrocodone-homatropine) .Marland Kitchen.. 1 tsp by mouth q 6 hrs as needed cough 13)  Ceftin 250 Mg Tabs (Cefuroxime axetil) .Marland Kitchen.. 1 by mouth two times a day for ten days  Patient Instructions: 1)  Please take all new medications as prescribed 2)  Continue all previous medications as before this visit  3)  Please go to Radiology in the basement level for your X-Ray today  4)  Please schedule a follow-up appointment in 3 months with CPX labs Prescriptions: HYDROCODONE-HOMATROPINE 5-1.5 MG/5ML SYRP (HYDROCODONE-HOMATROPINE) 1 tsp by mouth q 6 hrs as needed cough  #6oz x 1   Entered and Authorized by:   Corwin Levins MD   Signed by:   Corwin Levins MD on 11/01/2009   Method used:   Print then Give to Patient   RxID:   [id number] CEFACLOR 250 MG CAPS (CEFACLOR) 1po three times a day  #30 x 0   Entered and Authorized by:   Corwin Levins MD   Signed by:   Corwin Levins MD on 11/01/2009   Method used:   Print then Give to Patient   RxID:   [id number]

## 2010-06-28 NOTE — Assessment & Plan Note (Signed)
Summary: 59month f/u [mkj]   Referring Provider:  Dr. Jonny Ruiz Primary Provider:  Dr. Jonny Ruiz  CC:  follow-up visit and lab resutls and tests.  History of Present Illness: 60 yo F with hx of travel to [place] 2 years ago. She states she was ill for 2 months. was not hospitalized.   She has since had frequent illnesses. Easy fatigueability. Has noted pressure on her chest as well. States that she has episodes of bronchitis, congestion.  Was seen in ID clinic 04-2010 and had normal immunoglobulin levels, CH50 nl, negative serologies for mycosis (histo, blasto, crypto, cocci). She had a negative CT of sinuses in the intervening period as well.   Today states that she feels completely well. States that her illness usually lasts for a couple of months. At her last episode she had a significant vomitus of bloody mucus which she states resolved her illness. No fever or chills. No cough.   Preventive Screening-Counseling & Management  Alcohol-Tobacco     Alcohol drinks/day: 0     Alcohol type: wine     Smoking Status: never     Year Quit: 10 yrs ago     Pack years: 1 pk a day  Caffeine-Diet-Exercise     Caffeine use/day: 0     Exercise (avg: min/session): 10:15   Current Allergies (reviewed today): ! * TRAZADONE Current Medications (verified): 1)  Simvastatin 40 Mg Tabs (Simvastatin) .Marland Kitchen.. 1 By Mouth Once Daily 2)  Fiorinal 50-325-40 Mg  Caps (Butalbital-Aspirin-Caffeine) .Marland Kitchen.. 1 By Mouth Once Daily As Needed 3)  Amitriptyline Hcl 50 Mg  Tabs (Amitriptyline Hcl) .Marland Kitchen.. 1 By Mouth At Bedtime As Needed- 4)  Fexofenadine Hcl 180 Mg Tabs (Fexofenadine Hcl) .Marland Kitchen.. 1 By Mouth Once Daily As Needed  Allergies: 1)  ! * Trazadone   Additional History Menstrual Status:  postmenopausal  Vital Signs:  Patient profile:   60 year old female Menstrual status:  postmenopausal Height:      61 inches (154.94 cm) Weight:      147.5 pounds (67.05 kg) BMI:     27.97 Temp:     98.9 degrees F (37.17 degrees C)  oral Pulse rate:   92 / minute BP sitting:   113 / 74  (left arm) Cuff size:   regular  Vitals Entered By: Jennet Maduro RN (June 04, 2010 4:07 PM) CC: follow-up visit, lab resutls and tests Is Patient Diabetic? No Pain Assessment Patient in pain? no      Nutritional Status BMI of 25 - 29 = overweight Nutritional Status Detail appetite "great"  Have you ever been in a relationship where you felt threatened, hurt or afraid?not asked today   Does patient need assistance? Functional Status Self care Ambulation Normal     Menstrual Status postmenopausal   Physical Exam  General:  well-developed, well-nourished, and well-hydrated.     Impression & Recommendations:  Problem # 1:  COUGH (ICD-786.2) I reviewed her labs with her. They were all normal.  she is doing well. Her description of the resolution of her illness sounds like reflux. I will hold on getting the previously mentioned CT scan of chest unless her illness recurs (although not clear that even then it would be indicated). she will return to clinic as needed  Orders: Est. Patient Level III (04540)

## 2010-06-28 NOTE — Progress Notes (Signed)
Summary: nuc pre-procedure  Phone Note Outgoing Call   Call placed by: Harlow Asa CNMT Call placed to: Patient Reason for Call: Confirm/change Appt Summary of Call: Left message with information on [employer] Information Sheet (see scanned document for details).      Nuclear Med Background Indications for Stress Test: Evaluation for Ischemia   History: Asthma   Symptoms: Chest Pain, Chest Tightness, SOB    Nuclear Pre-Procedure Cardiac Risk Factors: Family History - CAD, History of Smoking, Hypertension, Lipids Height (in): 61

## 2010-06-28 NOTE — Progress Notes (Signed)
Summary: rx request  Phone Note Call from Patient Call back at [phone]   Summary of Call: Patient left message on triage that she is not that much better and would like prescription for Z-Pak called in. Please advise. Initial call taken by: Lucious Groves,  August 14, 2009 9:01 AM  Follow-up for Phone Call        patient notified. Follow-up by: Lucious Groves,  August 14, 2009 9:07 AM    Prescriptions: AZITHROMYCIN 250 MG TABS (AZITHROMYCIN) 2po qd for 1 day, then 1po qd for 4days, then stop  #6 x 1   Entered and Authorized by:   Corwin Levins MD   Signed by:   Corwin Levins MD on 08/14/2009   Method used:   Electronically to        [facility]* (retail)       [address]       [place], [place]  [id number]       Ph: [phone]       Fax: [phone]   RxID:   [id number]  done escript Corwin Levins MD  August 14, 2009 9:04 AM

## 2010-06-28 NOTE — Miscellaneous (Signed)
Summary: HIPAA Restrictions  HIPAA Restrictions   Imported By: Florinda Marker 04/26/2010 09:05:02  _____________________________________________________________________  External Attachment:    Type:   Image     Comment:   External Document

## 2010-06-28 NOTE — Miscellaneous (Signed)
Summary: Orders Update pft charges  Clinical Lists Changes  Orders: Added new Service order of Carbon Monoxide diffusing w/capacity (94720) - Signed Added new Service order of Lung Volumes (94240) - Signed Added new Service order of Spirometry (Pre & Post) (94060) - Signed 
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
## 2010-06-28 NOTE — Progress Notes (Signed)
Summary: returning call  Phone Note Call from Patient Call back at Work Phone [phone]   Caller: Patient Call For: wert Reason for Call: Talk to Nurse Summary of Call: Patient returning [name]'s call. Initial call taken by: [name],  April 27, 2010 8:22 AM  Follow-up for Phone Call        pt informed of MW CXR and lab recs. [name] CMA  April 27, 2010 9:20 AM

## 2010-06-28 NOTE — Assessment & Plan Note (Signed)
Summary: has had bronchitis/ chest hurts/nws   Vital Signs:  Patient profile:   60 year old female Height:      61 inches Weight:      144.50 pounds BMI:     27.40 O2 Sat:      94 % on Room air Temp:     98.5 degrees F oral Pulse rate:   93 / minute BP sitting:   98 / 60  (left arm) Cuff size:   regular  Vitals Entered By: Zella Ball Ewing CMA Duncan Dull) (April 06, 2010 3:32 PM)  O2 Flow:  Room air CC: Chest pain, congestion, cough, fatigue/RE/wellness   Primary Care Provider:  Dr. Jonny Ruiz  CC:  Chest pain, congestion, cough, and fatigue/RE/wellness.  History of Present Illness: here for well and f/u -  here with cough some improved after last visit, now worse again but overall onoging for months without overt wheezing or reflux, and no hoarseness, productivity, and No fever, wt loss, night sweats, loss of appetite or other constitutional symptoms Pt denies wheezing, orthopnea, pnd, worsening LE edema, palps, dizziness or syncope, but is not using the qvar and proair, and c/o chest tightness and  mild chest discomfort intermittent for the past 2 wks, dull nonpleuritic, nonexertional , without radiaaiton, n/v, palps, diaphoresis.  Pt denies new neuro symptoms such as headache, facial or extremity weakness  Pt denies polydipsia, polyuria  Overall good compliance with meds, trying to follow low chol diet, wt stable, little excercise however .  Denies worsening depressive symptoms, suicidal ideation, or panic.   Pt states good ability with ADL's, low fall risk, home safety reviewed and adequate, no significant change in hearing or vision, trying to follow lower chol diet, and occasionally active only with regular excercise.   Problems Prior to Update: 1)  Chest Pain  (ICD-786.50) 2)  Dyspnea  (ICD-786.05) 3)  Cough  (ICD-786.2) 4)  Wheezing  (ICD-786.07) 5)  Bronchitis-acute  (ICD-466.0) 6)  Cough  (ICD-786.2) 7)  Cough  (ICD-786.2) 8)  Ankle Sprain, Left  (ICD-845.00) 9)  Chest Pain   (ICD-786.50) 10)  Uri  (ICD-465.9) 11)  Preventive Health Care  (ICD-V70.0) 12)  Asthma  (ICD-493.90) 13)  Dyspnea  (ICD-786.05) 14)  Asthmatic Bronchitis, Acute  (ICD-466.0) 15)  [name]  (ICD-780.52) 16)  Anxiety  (ICD-300.00) 17)  [name]  (ICD-280.9) 18)  Diverticulosis, Colon  (ICD-562.10) 19)  Hypertension  (ICD-401.9) 20)  Hyperlipidemia  (ICD-272.4) 21)  Common Migraine  (ICD-346.10) 22)  Depression  (ICD-311) 23)  Allergic Rhinitis  (ICD-477.9)  Medications Prior to Update: 1)  Simvastatin 80 Mg Tabs (Simvastatin) .... Take 1 Tablet By Mouth Once A Day 2)  Estraderm 0.05 Mg/24hr Pttw (Estradiol) .... Use Asd 3)  Fiorinal 50-325-40 Mg  Caps (Butalbital-Aspirin-Caffeine) .Marland Kitchen.. 1 By Mouth Once Daily As Needed 4)  Caltrate .... Take 1 Tablet By Mouth Once A Day 5)  Amitriptyline Hcl 50 Mg  Tabs (Amitriptyline Hcl) .Marland Kitchen.. 1 By Mouth At Bedtime As Needed- 6)  Alprazolam 0.25 Mg Tabs (Alprazolam) .Marland Kitchen.. 1 By Mouth Once Daily As Needed 7)  Qvar 80 Mcg/act Aers (Beclomethasone Dipropionate) .... Two Puffs Two Times A Day 8)  Proair Hfa 108 (90 Base) Mcg/act Aers (Albuterol Sulfate) .... Two Puffs Up To Four Times Per Day As Needed 9)  Singulair 10 Mg Tabs (Montelukast Sodium) .Marland Kitchen.. 1 By Mouth Once Daily 10)  Fexofenadine Hcl 180 Mg Tabs (Fexofenadine Hcl) .Marland Kitchen.. 1 By Mouth Once Daily As Needed 11)  Hydrocodone-Homatropine  5-1.5 Mg/36ml Syrp (Hydrocodone-Homatropine) .Marland Kitchen.. 1 Tsp By Mouth Q 6 Hrs As Needed Cough 12)  Tramadol Hcl 50 Mg Tabs (Tramadol Hcl) .Marland Kitchen.. 1-2 By Mouth Q 6 Hrs As Needed Pain 13)  Levofloxacin 500 Mg Tabs (Levofloxacin) .Marland Kitchen.. 1po Once Daily 14)  Prednisone 10 Mg Tabs (Prednisone) .... 3po Qd For 3days, Then 2po Qd For 3days, Then 1po Qd For 3days, Then Stop  Current Medications (verified): 1)  Simvastatin 40 Mg Tabs (Simvastatin) .Marland Kitchen.. 1 By Mouth Once Daily 2)  Fiorinal 50-325-40 Mg  Caps (Butalbital-Aspirin-Caffeine) .Marland Kitchen.. 1 By Mouth Once  Daily As Needed 3)  Amitriptyline Hcl 50 Mg  Tabs (Amitriptyline Hcl) .Marland Kitchen.. 1 By Mouth At Bedtime As Needed- 4)  Alprazolam 0.25 Mg Tabs (Alprazolam) .Marland Kitchen.. 1 By Mouth Once Daily As Needed 5)  Singulair 10 Mg Tabs (Montelukast Sodium) .Marland Kitchen.. 1 By Mouth Once Daily 6)  Fexofenadine Hcl 180 Mg Tabs (Fexofenadine Hcl) .Marland Kitchen.. 1 By Mouth Once Daily As Needed 7)  Tessalon Perles 100 Mg Caps (Benzonatate) .Marland Kitchen.. 1-2 By Mouth Three Times A Day As Needed Cough 8)  Symbicort 160-4.5 Mcg/act Aero (Budesonide-Formoterol Fumarate) .... 2 Puffs Two Times A Day  Allergies (verified): 1)  ! 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         all otherwise negative per pt -    Physical Exam  General:  alert and overweight-appearing. Head:  normocephalic and atraumatic.   Eyes:  vision grossly intact, pupils equal, and pupils round.   Ears:  R ear normal and L ear normal.   Nose:  no external deformity  and no nasal discharge.   Mouth:  no gingival abnormalities and pharynx pink and moist.   Neck:  supple and no masses.   Lungs:  normal respiratory effort and normal breath sounds.   Heart:  normal rate and regular rhythm.   Abdomen:  soft, non-tender, and normal bowel sounds.   Msk:  no joint tenderness and no joint swelling.   Extremities:  no edema, no erythema  Neurologic:  strength normal in all extremities and sensation intact to light touch.   Skin:  color normal and no rashes.   Psych:  not depressed appearing and slightly anxious.     Impression & Recommendations:  Problem # 1:  Preventive Health Care (ICD-V70.0) Overall doing well, age appropriate education and counseling updated, referral for preventive services and immunizations addressed, dietary counseling and smoking status adressed , most recent labs reviewed, ecg reviewed I have personally reviewed and have noted 1.The patient's medical and social history 2.Their use of alcohol, tobacco or illicit drugs 3.Their current medications and supplements 4. Functional ability including ADL's, fall risk, home safety risk, hearing & visual impairment  5.Diet and physical activities  6.Evidence for depression or mood disorders The patients weight, height, BMI  have been recorded in the chart I have made referrals, counseling and provided education to the patient based review of the above  Orders: TLB-BMP (Basic Metabolic Panel-BMET) (80048-METABOL) TLB-CBC Platelet - w/Differential (85025-CBCD) TLB-IBC Pnl (Iron/FE;Transferrin) (83550-IBC) TLB-Lipid Panel (80061-LIPID) TLB-TSH (Thyroid Stimulating Hormone) (84443-TSH) TLB-Udip ONLY (81003-UDIP)  Problem # 2:  COUGH (ICD-786.2)  no overt wheezing or reflux today - for cxr repeat, f/u pulm as already referred; pt requests PPD as she has been in [place] 2 yrs ago  Orders: T-2 View CXR, Same Day (71020.5TC)  Problem # 3:  DYSPNEA (ICD-786.05)  not taking the qvar adn  proair  for ? reason;  will try trial symbicort 160/xx two times a day , also for echo and PFT's  Orders: Echo Referral (Echo) Misc. Referral (Misc. Ref)  Problem # 4:  CHEST PAIN (ICD-786.50)  ecg reviewed, for cxr as above, and stress test  Orders: EKG w/ Interpretation (93000) Cardiolite (Cardiolite) T-2 View CXR, Same Day (71020.5TC)  Complete Medication List: 1)  Simvastatin 40 Mg Tabs (Simvastatin) .Marland Kitchen.. 1 by mouth once daily 2)  Fiorinal 50-325-40 Mg Caps (Butalbital-aspirin-caffeine) .Marland Kitchen.. 1 by mouth once daily as needed 3)  Amitriptyline Hcl 50 Mg Tabs (Amitriptyline hcl) .Marland Kitchen.. 1 by mouth at bedtime as needed- 4)  Alprazolam 0.25 Mg Tabs (Alprazolam) .Marland Kitchen.. 1 by mouth once daily as needed 5)  Singulair 10 Mg Tabs (Montelukast sodium) .Marland Kitchen.. 1 by mouth once daily 6)  Fexofenadine Hcl 180 Mg Tabs (Fexofenadine hcl) .Marland Kitchen.. 1 by mouth once daily as needed 7)  Tessalon Perles 100 Mg Caps (Benzonatate) .Marland Kitchen.. 1-2 by mouth three times a day as needed cough 8)  Symbicort 160-4.5 Mcg/act Aero (Budesonide-formoterol fumarate) .... 2 puffs two times a day  Patient Instructions: 1)  You had the PPD placed today 2)  Please return on Monday Nov 14 for Nurse visit to have this read 3)  Please go to Radiology in the basement level for your X-Ray today  4)  Please go to the Lab in the basement for your blood and/or urine tests today 5)  Please call the number on the [facility] Card for results of your testing 6)  You will be contacted about the referral(s) to: Echocardiogram, stress test, lung testing (PFT's) 7)  Please start the symbicort at 2 puffs two times a day 8)  you can also use the tessalon perle for cough as needed 9)  decreased the simvastatin to 40 mg for safety 10)  please re-try the generic allegra (fexofenadine) for allergies to see if post nasal drip could contribute to the cough 11)  Please keep your specialist appts as you have planned 12)  Please schedule a follow-up appointment as  needed. Prescriptions: TESSALON PERLES 100 MG CAPS (BENZONATATE) 1-2 by mouth three times a day as needed cough  #60 x 1   Entered and Authorized by:   Corwin Levins MD   Signed by:   Corwin Levins MD on 04/06/2010   Method used:   Print then Give to Patient   RxID:   [id number] SYMBICORT 160-4.5 MCG/ACT AERO (BUDESONIDE-FORMOTEROL FUMARATE) 2 puffs two times a day  #1 x 11   Entered and Authorized by:   Corwin Levins MD   Signed by:   Corwin Levins MD on 04/06/2010   Method used:   Print then Give to Patient   RxID:   [id number] FEXOFENADINE HCL 180 MG TABS (  FEXOFENADINE HCL) 1 by mouth once daily as needed  #90 x 3   Entered and Authorized by:   Corwin Levins MD   Signed by:   Corwin Levins MD on 04/06/2010   Method used:   Print then Give to Patient   RxID:   [id number] SIMVASTATIN 40 MG TABS (SIMVASTATIN) 1 by mouth once daily  #90 x 3   Entered and Authorized by:   Corwin Levins MD   Signed by:   Corwin Levins MD on 04/06/2010   Method used:   Print then Give to Patient   RxID:   [id number]    Orders Added: 1)  EKG w/ Interpretation [93000] 2)  Cardiolite [Cardiolite] 3)  Echo Referral [Echo] 4)  Misc. Referral [Misc. Ref] 5)  TLB-BMP (Basic Metabolic Panel-BMET) [80048-METABOL] 6)  TLB-CBC Platelet - w/Differential [85025-CBCD] 7)  TLB-IBC Pnl (Iron/FE;Transferrin) [83550-IBC] 8)  TLB-Lipid Panel [80061-LIPID] 9)  TLB-TSH (Thyroid Stimulating Hormone) [84443-TSH] 10)  TLB-Udip ONLY [81003-UDIP] 11)  T-2 View CXR, Same Day [71020.5TC] 12)  Est. Patient 40-64 years [99396]   PPD Skin Test (to be given today)    Appended Document: has had bronchitis/ chest hurts/nws     Clinical Lists Changes  Observations: Added new observation of TB PPDRESULT: negative (04/09/2010 16:28) Added new observation of PPD RESULT: < 5mm (04/09/2010 16:28) Added new observation of TB-PPD RDDTE: 04/09/2010 (04/09/2010 16:28)       PPD Results    Date of  reading: 04/09/2010    Results: < 5mm    Interpretation: negative  MD closed out chart before read PPD

## 2010-06-28 NOTE — Progress Notes (Signed)
Summary: Referral   Phone Note Call from Patient   Caller: Patient [phone] Summary of Call: Pt called stating she is still having chest pain and cough. Pt is requesting referral to Infectious [facility] Ninetta Lights or Dr Algis Liming [phone]. Pt says that since she went to [place] 2 years ago she gets severly sick with even a mild cold. Please advise Initial call taken by: Margaret Pyle, CMA,  April 04, 2010 8:51 AM  Follow-up for Phone Call        probably better to refer to pulmonary - is this OK? Follow-up by: Corwin Levins MD,  April 04, 2010 4:57 PM  Additional Follow-up for Phone Call Additional follow up Details #1::        left detailed vm on pt's home # to call office back if she would like referral................Marland KitchenLamar Sprinkles, CMA  April 04, 2010 5:24 PM   Pt called back stating she does not want referral to pulmonary, she wants a referral to ID to either specified MD. Additional Follow-up by: Margaret Pyle, CMA,  April 05, 2010 8:19 AM    Additional Follow-up for Phone Call Additional follow up Details #2::    ok - will refer Follow-up by: Corwin Levins MD,  April 05, 2010 8:26 AM  Additional Follow-up for Phone Call Additional follow up Details #3:: Details for Additional Follow-up Action Taken: Pt informed and will expect call from [facility] with appt info Additional Follow-up by: Margaret Pyle, CMA,  April 05, 2010 8:42 AM

## 2010-06-28 NOTE — Miscellaneous (Signed)
Summary: Appointment Canceled  Appointment status changed to canceled by [employer] on 04/11/2010 8:25 AM.  Cancellation Comments --------------------- echo/doe/jml  Appointment Information ----------------------- Appt Type:  CARDIOLOGY ANCILLARY VISIT      Date:  Tuesday, April 17, 2010      Time:  4:00 PM for 60 min   Urgency:  Routine   Made By:  Hoy Finlay Scheduler  To Visit:  [id number]    Reason:  echo/doe/jml  Appt Comments ------------- -- 04/11/10 8:25: ([facility]) CANCELED -- echo/doe/jml -- 04/09/10 11:12: ([facility]) BOOKED -- Routine CARDIOLOGY ANCILLARY VISIT at 04/17/2010 4:00 PM for 60 min echo/doe/jml

## 2010-06-28 NOTE — Assessment & Plan Note (Signed)
Summary: new pt ref   Referring Provider:  Dr. Jonny Ruiz Primary Provider:  Dr. Jonny Ruiz  CC:  New Consult - Dr Oliver Barre      c/o episodes of frequent upper respiratory infection after [place] trip  2 years ago  and Depression.  History of Present Illness: 60 yo F with hx of travel to [place] 2 years ago. She states she was ill for 2 months. was not hospitalized.   She has since had frequent illnesses. Easy fatigueability. Has noted alot of pressure on her chest as well. States that she has episodes of bronchitis, congestion. Last episode 2 months ago. Has fever and chills (temps to 98/99). Occas prod cough. Feels like she has drainage that goes into her chest that just "stays there for months".   NL CXR and ESR 04-24-10.   Depression History:      The patient denies a depressed mood most of the day and a diminished interest in her usual daily activities.        The patient denies that she feels like life is not worth living, denies that she wishes that she were dead, and denies that she has thought about ending her life.         Preventive Screening-Counseling & Management  Alcohol-Tobacco     Alcohol drinks/day: 0     Smoking Status: never  Caffeine-Diet-Exercise     Caffeine use/day: 0   Updated Prior Medication List: SIMVASTATIN 40 MG TABS (SIMVASTATIN) 1 by mouth once daily FIORINAL 50-325-40 MG  CAPS (BUTALBITAL-ASPIRIN-CAFFEINE) 1 by mouth once daily as needed AMITRIPTYLINE HCL 50 MG  TABS (AMITRIPTYLINE HCL) 1 by mouth at bedtime as needed- FEXOFENADINE HCL 180 MG TABS (FEXOFENADINE HCL) 1 by mouth once daily as needed  Current Allergies (reviewed today): ! * TRAZADONE Past History:  Past Medical History: Last updated: 04/24/2010 Allergic rhinitis Depression Migraine Hyperlipidemia Hypertension Diverticulosis, colon [name] Anxiety Mild mitral regurgitation, Echo 05/11/08 Dyspnea/ cough..................................................Marland KitchenWert      -  Neg w/u in 2008       - PFT's 04/30/07 FEV1  2.54 (115%) ratio 78,  DLCO 83%  Past Surgical History: Last updated: 06/07/2008 Appendectomy TAH with one ovary removed  Family History: Last updated: 04/24/2010 heart disease in father and brother Negative for respiratory diseases or atopy   Social History: Last updated: 04/24/2010 Former Smoker. Quit in 1999.  Smoked x 20 yrs up 1 ppd. 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 Had PPD 2 weeks ago- negative.   Vital Signs:  Patient profile:   60 year old female Height:      61 inches Weight:      147 pounds BMI:     27.88 BSA:     1.66 Temp:     97.7 degrees F oral Pulse rate:   86 / minute BP sitting:   107 / 74  (left arm)  Vitals Entered By: Tomasita Morrow RN (April 25, 2010 2:56 PM) CC: New Consult - Dr Oliver Barre      c/o episodes of frequent upper respiratory infection after [place] trip  2 years ago , Depression Pain Assessment Patient in pain? no        Physical Exam  General:  well-developed, well-nourished, and well-hydrated.   Eyes:  pupils  equal, pupils round, and pupils reactive to light.  mild injection on L.  Mouth:  pharynx pink and moist and no exudates.   Neck:  no masses.   Lungs:  normal respiratory effort and normal breath sounds.   Heart:  normal rate, regular rhythm, and no murmur.   Abdomen:  soft, non-tender, and normal bowel sounds.     Impression & Recommendations:  Problem # 1:  COUGH (ICD-786.2)  Her syndrome seems odd for an infectious source. The timing and point of start ([place]) could suggest this. For her to have signs for this long a more indolent cause (such as fungus) would be a consideration. Also, screening her for immunodeficiency could be considered as well (very  uncommon in someone this old). Agee with Dr Sherene Sires that a CT chest mya be a useful next step. will see her back in 3-4 weeks after labs return.   Orders: T-CBC w/Diff [phone]) T-CD4 [phone]) T- * Misc. Laboratory test [phone]) Consultation Level IV [phone])

## 2010-06-28 NOTE — Letter (Signed)
Summary: Out of Work  [facility]  [address] [place], [place] [address]   Phone: [phone]  Fax: [phone]    March 19, 2010   Employee:  [name]    To Whom It May Concern:   For Medical reasons, please excuse the above named employee from work for the following dates:  Start:   03/20/2010  End:   03/20/2010  If you need additional information, please feel free to contact our office.         Sincerely,    Dr. Oliver Barre

## 2010-06-28 NOTE — Assessment & Plan Note (Signed)
Summary: chest congested w/cough/cd   Vital Signs:  Patient profile:   60 year old female Height:      61 inches Weight:      141.50 pounds BMI:     26.83 O2 Sat:      94 % on Room air Temp:     97.6 degrees F oral Pulse rate:   83 / minute BP sitting:   104 / 60  (left arm) Cuff size:   regular  Vitals Entered By: Zella Ball Ewing CMA Duncan Dull) (March 19, 2010 2:55 PM)  O2 Flow:  Room air CC: Congestion, cough/RE   Primary Care Mehran Guderian:  Dr. Jonny Ruiz  CC:  Congestion and cough/RE.  History of Present Illness: here with acute onset mild to mod 2-3 days onset fever, ST, prod cough greenish sputum, headache, general weakness and malaise; and today with onset mid wheezing, sob. Pt denies CP,  orthopnea, pnd, worsening LE edema, palps, dizziness or syncope Pt denies new neuro symptoms such as headache, facial or extremity weakness  BP at home < 140/90.  Pt denies polydipsia, polyuria. Denies worsening depressive symptoms, suicidal ideation, or panic.    Problems Prior to Update: 1)  Wheezing  (ICD-786.07) 2)  Bronchitis-acute  (ICD-466.0) 3)  Cough  (ICD-786.2) 4)  Cough  (ICD-786.2) 5)  Ankle Sprain, Left  (ICD-845.00) 6)  Chest Pain  (ICD-786.50) 7)  Uri  (ICD-465.9) 8)  Preventive Health Care  (ICD-V70.0) 9)  Asthma  (ICD-493.90) 10)  Dyspnea  (ICD-786.05) 11)  Asthmatic Bronchitis, Acute  (ICD-466.0) 12)  [name]  (ICD-780.52) 13)  Anxiety  (ICD-300.00) 14)  [name]  (ICD-280.9) 15)  Diverticulosis, Colon  (ICD-562.10) 16)  Hypertension  (ICD-401.9) 17)  Hyperlipidemia  (ICD-272.4) 18)  Common Migraine  (ICD-346.10) 19)  Depression  (ICD-311) 20)  Allergic Rhinitis  (ICD-477.9)  Medications Prior to Update: 1)  Simvastatin 80 Mg Tabs (Simvastatin) .... Take 1 Tablet By Mouth Once A Day 2)  Estraderm 0.05 Mg/24hr Pttw (Estradiol) .... Use Asd 3)  Fiorinal 50-325-40 Mg  Caps (Butalbital-Aspirin-Caffeine) .Marland Kitchen.. 1 By Mouth Once Daily As  Needed 4)  Caltrate .... Take 1 Tablet By Mouth Once A Day 5)  Amitriptyline Hcl 50 Mg  Tabs (Amitriptyline Hcl) .Marland Kitchen.. 1 By Mouth At Bedtime As Needed- 6)  Alprazolam 0.25 Mg Tabs (Alprazolam) .Marland Kitchen.. 1 By Mouth Once Daily As Needed 7)  Qvar 80 Mcg/act Aers (Beclomethasone Dipropionate) .... Two Puffs Two Times A Day 8)  Proair Hfa 108 (90 Base) Mcg/act Aers (Albuterol Sulfate) .... Two Puffs Up To Four Times Per Day As Needed 9)  Singulair 10 Mg Tabs (Montelukast Sodium) .Marland Kitchen.. 1 By Mouth Once Daily 10)  Fexofenadine Hcl 180 Mg Tabs (Fexofenadine Hcl) .Marland Kitchen.. 1 By Mouth Once Daily As Needed 11)  Hydrocodone-Homatropine 5-1.5 Mg/35ml Syrp (Hydrocodone-Homatropine) .Marland Kitchen.. 1 Tsp By Mouth Q 6 Hrs As Needed Cough 12)  Tramadol Hcl 50 Mg Tabs (Tramadol Hcl) .Marland Kitchen.. 1-2 By Mouth Q 6 Hrs As Needed Pain  Current Medications (verified): 1)  Simvastatin 80 Mg Tabs (Simvastatin) .... Take 1 Tablet By Mouth Once A Day 2)  Estraderm 0.05 Mg/24hr Pttw (Estradiol) .... Use Asd 3)  Fiorinal 50-325-40 Mg  Caps (Butalbital-Aspirin-Caffeine) .Marland Kitchen.. 1 By Mouth Once Daily As Needed 4)  Caltrate .... Take 1 Tablet By Mouth Once A Day 5)  Amitriptyline Hcl 50 Mg  Tabs (Amitriptyline Hcl) .Marland Kitchen.. 1 By Mouth At Bedtime As Needed- 6)  Alprazolam 0.25 Mg Tabs (Alprazolam) .Marland Kitchen.. 1 By Mouth Once  Daily As Needed 7)  Qvar 80 Mcg/act Aers (Beclomethasone Dipropionate) .... Two Puffs Two Times A Day 8)  Proair Hfa 108 (90 Base) Mcg/act Aers (Albuterol Sulfate) .... Two Puffs Up To Four Times Per Day As Needed 9)  Singulair 10 Mg Tabs (Montelukast Sodium) .Marland Kitchen.. 1 By Mouth Once Daily 10)  Fexofenadine Hcl 180 Mg Tabs (Fexofenadine Hcl) .Marland Kitchen.. 1 By Mouth Once Daily As Needed 11)  Hydrocodone-Homatropine 5-1.5 Mg/31ml Syrp (Hydrocodone-Homatropine) .Marland Kitchen.. 1 Tsp By Mouth Q 6 Hrs As Needed Cough 12)  Tramadol Hcl 50 Mg Tabs (Tramadol Hcl) .Marland Kitchen.. 1-2 By Mouth Q 6 Hrs As Needed Pain 13)  Levofloxacin 500 Mg Tabs (Levofloxacin) .Marland Kitchen.. 1po Once Daily 14)   Prednisone 10 Mg Tabs (Prednisone) .... 3po Qd For 3days, Then 2po Qd For 3days, Then 1po Qd For 3days, Then Stop  Allergies (verified): 1)  ! 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  , mild ill  Head:  normocephalic and atraumatic.   Eyes:  vision grossly intact, pupils equal, and pupils round.   Ears:  bilat tm's mild red, sinus nontender Nose:  nasal dischargemucosal pallor and mucosal edema.   Mouth:  pharyngeal erythema and fair dentition.   Neck:  supple and no JVD.   Lungs:  normal respiratory effort, R decreased breath sounds, R wheezes, L decreased breath sounds, and L wheezes.   Heart:  normal rate and regular rhythm.   Extremities:  no edema, no erythema  Psych:  not depressed appearing and slightly anxious.     Impression & Recommendations:  Problem # 1:  BRONCHITIS-ACUTE (ICD-466.0)  Her updated medication list for this problem includes:    Qvar 80 Mcg/act Aers (Beclomethasone dipropionate) .Marland Kitchen..Marland Kitchen Two puffs two times a day    Proair Hfa 108 (90 Base) Mcg/act Aers (Albuterol sulfate) .Marland Kitchen..Marland Kitchen Two puffs up to four times per day as needed    Singulair 10 Mg Tabs (Montelukast sodium) .Marland Kitchen... 1 by mouth once daily    Hydrocodone-homatropine 5-1.5 Mg/47ml Syrp (Hydrocodone-homatropine) .Marland Kitchen... 1 tsp by mouth q 6 hrs as needed cough     Levofloxacin 500 Mg Tabs (Levofloxacin) .Marland Kitchen... 1po once daily treat as above, f/u any worsening signs or symptoms   Problem # 2:  WHEEZING (ICD-786.07) mild , for predpack for home as well, f/u any worsening symptoms  Problem # 3:  HYPERTENSION (ICD-401.9)  BP today: 104/60 Prior BP: 102/66 (12/01/2009)  Labs Reviewed: K+: 3.6 (01/25/2009) Creat: : 0.9 (01/25/2009)   Chol: 136 (01/25/2009)   HDL: 64.60 (01/25/2009)   LDL: 45 (01/25/2009)   TG: 134.0 (01/25/2009) stable overall by hx and exam, ok to continue meds/tx as is - does not need BP med at this time  Problem # 4:  ANXIETY (ICD-300.00)  Her updated medication list for this problem includes:    Amitriptyline Hcl 50 Mg Tabs (Amitriptyline hcl) .Marland Kitchen... 1 by mouth at bedtime as needed-    Alprazolam 0.25 Mg Tabs (  Alprazolam) .Marland Kitchen... 1 by mouth once daily as needed stable overall by hx and exam, ok to continue meds/tx as is   Complete Medication List: 1)  Simvastatin 80 Mg Tabs (Simvastatin) .... Take 1 tablet by mouth once a day 2)  Estraderm 0.05 Mg/24hr Pttw (Estradiol) .... Use asd 3)  Fiorinal 50-325-40 Mg Caps (Butalbital-aspirin-caffeine) .Marland Kitchen.. 1 by mouth once daily as needed 4)  Caltrate  .... Take 1 tablet by mouth once a day 5)  Amitriptyline Hcl 50 Mg Tabs (Amitriptyline hcl) .Marland Kitchen.. 1 by mouth at bedtime as needed- 6)  Alprazolam 0.25 Mg Tabs (Alprazolam) .Marland Kitchen.. 1 by mouth once daily as needed 7)  Qvar 80 Mcg/act Aers (Beclomethasone dipropionate) .... Two puffs two times a day 8)  Proair Hfa 108 (90 Base) Mcg/act Aers (Albuterol sulfate) .... Two puffs up to four times per day as needed 9)  Singulair 10 Mg Tabs (Montelukast sodium) .Marland Kitchen.. 1 by mouth once daily 10)  Fexofenadine Hcl 180 Mg Tabs (Fexofenadine hcl) .Marland Kitchen.. 1 by mouth once daily as needed 11)  Hydrocodone-homatropine 5-1.5 Mg/7ml Syrp (Hydrocodone-homatropine) .Marland Kitchen.. 1 tsp by mouth q 6 hrs as needed cough 12)  Tramadol Hcl 50 Mg Tabs (Tramadol hcl) .Marland Kitchen.. 1-2 by mouth q 6  hrs as needed pain 13)  Levofloxacin 500 Mg Tabs (Levofloxacin) .Marland Kitchen.. 1po once daily 14)  Prednisone 10 Mg Tabs (Prednisone) .... 3po qd for 3days, then 2po qd for 3days, then 1po qd for 3days, then stop  Patient Instructions: 1)  Please take all new medications as prescribed - the antibiotic 2)  Continue all previous medications as before this visit , including the cough medicine you have at home 3)  You can also use Mucinex OTC or it's generic for congestion  4)  Please schedule a follow-up appointment in 3 months with CPX labs Prescriptions: PREDNISONE 10 MG TABS (PREDNISONE) 3po qd for 3days, then 2po qd for 3days, then 1po qd for 3days, then stop  #18 x 0   Entered and Authorized by:   Corwin Levins MD   Signed by:   Corwin Levins MD on 03/19/2010   Method used:   Print then Give to Patient   RxID:   [id number] LEVOFLOXACIN 500 MG TABS (LEVOFLOXACIN) 1po once daily  #10 x 0   Entered and Authorized by:   Corwin Levins MD   Signed by:   Corwin Levins MD on 03/19/2010   Method used:   Print then Give to Patient   RxID:   [id number]    Orders Added: 1)  Est. Patient Level IV [42706]

## 2010-06-28 NOTE — Miscellaneous (Signed)
Summary: Appointment Canceled  Appointment status changed to canceled by [employer] on 04/11/2010 8:24 AM.  Cancellation Comments --------------------- crs/dx:chest pain/wt:1445/ins bcbs/ dr Jonny Ruiz  Appointment Information ----------------------- Appt Type:  CARDIOLOGY NUCLEAR TESTING      Date:  Tuesday, April 17, 2010      Time:  12:00 PM for 15 min   Urgency:  Routine   Made By:  Pearson Grippe  To Visit:  [id number]    Reason:  crs/dx:chest pain/wt:1445/ins bcbs/ dr Jonny Ruiz  Appt Comments ------------- -- 04/11/10 8:24: ([facility]EM[facility]) CANCELED -- crs/dx:chest pain/wt:1445/ins bcbs/ dr Jonny Ruiz -- 04/09/10 11:12: ([facility]) BOOKED -- Routine CARDIOLOGY NUCLEAR TESTING at 04/17/2010 12:00 PM for 15 min crs/dx:chest pain/wt:1445/ins bcbs/ dr Jonny Ruiz

## 2010-06-28 NOTE — Progress Notes (Signed)
Summary: med. refill  Phone Note Refill Request Message from:  Fax from Pharmacy on May 16, 2010 3:44 PM  Refills Requested: Medication #1:  FIORINAL 50-325-40 MG  CAPS 1 by mouth once daily as needed   Dosage confirmed as above?Dosage Confirmed   Last Refilled: 01/25/2009   Notes: [facility], [phone] Initial call taken by: Zella Ball Ewing CMA Duncan Dull),  May 16, 2010 3:45 PM  Follow-up for Phone Call        done hardcopy to LIM side B - dahlia  Follow-up by: Corwin Levins MD,  May 16, 2010 4:03 PM  Additional Follow-up for Phone Call Additional follow up Details #1::        Rx faxed to [facility] Additional Follow-up by: Brenton Grills CMA Duncan Dull),  May 16, 2010 4:08 PM    Prescriptions: Benny Lennert 09-811-91 MG  CAPS [facility]) 1 by mouth once daily as needed  #60 x 3   Entered and Authorized by:   Corwin Levins MD   Signed by:   Corwin Levins MD on 05/16/2010   Method used:   Print then Give to Patient   RxID:   [id number]

## 2010-06-28 NOTE — Assessment & Plan Note (Signed)
Summary: Cardiology Nuclear Testing  Nuclear Med Background Indications for Stress Test: Evaluation for Ischemia   History: Asthma, Myocardial Perfusion Study  History Comments: 12/08 MPS: NL, EF=80%.  Symptoms: Chest Pain, Chest Pressure, Chest Pressure with Exertion, Chest Tightness, Fatigue with Exertion, Rapid HR, SOB, Vomiting  Symptoms Comments: Last CP this am.   Nuclear Pre-Procedure Cardiac Risk Factors: Family History - CAD, History of Smoking, Hypertension, Lipids Caffeine/Decaff Intake: None NPO After: 9:00 PM Lungs: Clear IV 0.9% NS with Angio Cath: 22g     IV Site: R Hand IV Started by: Cathlyn Parsons, RN Chest Size (in) 36     Cup Size C     Height (in): 61 Weight (lb): 141 BMI: 26.74  Nuclear Med Study 1 or 2 day study:  1 day     Stress Test Type:  Stress Reading MD:  Dietrich Pates, MD     Referring MD:  Oliver Barre Resting Radionuclide:  Technetium 18m Tetrofosmin     Resting Radionuclide Dose:  11.0 mCi  Stress Radionuclide:  Technetium 87m Tetrofosmin     Stress Radionuclide Dose:  33.0 mCi   Stress Protocol Exercise Time (min):  10:15 min     Max HR:  144 bpm     Predicted Max HR:  162 bpm  Max Systolic BP: 154 mm Hg     Percent Max HR:  88.89 %     METS: 11.4 Rate Pressure Product:  [address]    Stress Test Technologist:  Irean Hong,  RN     Nuclear Technologist:  Doyne Keel, CNMT  Rest Procedure  Myocardial perfusion imaging was performed at rest 45 minutes following the intravenous administration of Technetium 76m Tetrofosmin.  Stress Procedure  The patient exercised for 10 minutes and 15 seconds, RPE=17.  The patient stopped due to DOE, bilateral ankle, shin to calf tightness,   and denied any chest pain.  There were nonspecific ST-T wave changes, occ. PVC's.  Technetium 31m Tetrofosmin was injected at peak exercise and myocardial perfusion imaging was performed after a brief delay.  QPS Raw Data Images:  Images were motion corrected.  Soft  tissue (diaphragm, breast) surround heart. Stress Images:  Normal homogeneous uptake in all areas of the myocardium. Rest Images:  Normal homogeneous uptake in all areas of the myocardium. Transient Ischemic Dilatation:  0.81  (Normal <1.22)  Lung/Heart Ratio:  0.35  (Normal <0.45)  Quantitative Gated Spect Images QGS EDV:  44 ml QGS ESV:  7 ml QGS EF:  83 %   Overall Impression  Exercise Capacity: Excellent exercise capacity. BP Response: Normal blood pressure response. Clinical Symptoms: No chest pain ECG Impression: No significant ST segment change suggestive of ischemia. Overall Impression: Normal stress nuclear study.  Appended Document: Cardiology Nuclear Testing LMOPT - labs negative, normal, or stable  - No Acute problem

## 2010-06-28 NOTE — Progress Notes (Signed)
Summary: REQ A CALL   Phone Note Call from Patient Call back at Work Phone [phone]   Summary of Call: Pt hurt her ankle a week ago and continues to c/o bruising and swelling. Patient is requesting a call back w/advisement if she needs office visit.  Initial call taken by: Lamar Sprinkles, CMA,  November 29, 2009 12:45 PM  Follow-up for Phone Call        Pt advised via VM to sch OV for eval of her ankles with continued pain, swelling and brusing. Pt also told to leave a message with triage if any further questions or concerns Follow-up by: Margaret Pyle, CMA,  November 29, 2009 3:31 PM

## 2010-08-07 ENCOUNTER — Encounter: Payer: Self-pay | Admitting: *Deleted

## 2010-08-07 LAB — T-HELPER CELL (CD4) - (RCID CLINIC ONLY)
CD4 % Helper T Cell: 54 % (ref 33–55)
CD4 T Cell Abs: 1400 uL (ref 400–2700)

## 2010-10-03 ENCOUNTER — Telehealth: Payer: Self-pay | Admitting: Internal Medicine

## 2010-10-03 NOTE — Telephone Encounter (Signed)
Pt advised of MD's recommendation

## 2010-10-03 NOTE — Telephone Encounter (Signed)
Ok to follow for now, as long as no other pain or bleeding;  xrays would not help, and neither EGD likely unless she has symtpoms

## 2010-10-03 NOTE — Telephone Encounter (Signed)
Pt thinks she swallowed glass, yesterday at lunch time. She had peanut butter sandwich, upper mouth a little cut. No stomach pain or other symptoms. Please advise.

## 2010-10-09 NOTE — Assessment & Plan Note (Signed)
[facility]                             PULMONARY OFFICE NOTE   NAME:[name], [name]                         MRN:          [id number]  DATE:04/02/2007                            DOB:          [birth year]    REASON FOR CONSULTATION:  Dyspnea.   HISTORY:  A 60 year old white female who quit smoking 9 years ago after  having bad pneumonia with pleurisy but since then doing fine.  Had  done fine on her best days (unless she had a cold going into her chest  which typically resulted in exacerbation but was fine in between  exacerbations with excellent exercise tolerance) until about a year ago  and since then has been going down hill.  Her main complaint is one  that she is tired all the time.  For the last 3-4 weeks she has been  having intermittent dyspnea that occurs at rest, nor reproducible with  exertion.  She was seen on October 17 at [facility] for evaluation of  chest discomfort that has been present constantly since that time  although seems to be a little less severe than it was the day she went  to [facility] and was diagnosed with bronchitis.  The pain is anterior  on the right, not reproduced with cough or exertion, no more better  after receiving nebulizer therapy.  She was given multiple inhalers  which she said did not do any good.   She comes back today complaining of intermittent palpitations that she  attributed to using inhalers that has not completely gone away.   She denies any fevers, chills, sweats, orthopnea, PND or leg swelling.   She does have a history of frequent airplane travel including to  [place], [place] and the [place].   PAST MEDICAL HISTORY:  1. Hypertension.  2. Hyperlipidemia.  3. Migraine headaches.  4. Hysterectomy.  5. Appendectomy.   ALLERGIES:  TRAZODONE, NONSPECIFIC REACTION.   MEDICATIONS:  1. Elavil.  2. __________ .  3. Caltrate.  4. Simvastatin.   SOCIAL HISTORY:  She quit smoking 9  years ago as noted, works as an  Print production planner for a [employer].   FAMILY HISTORY:  Positive for heart disease in her father and brother.   REVIEW OF SYSTEMS:  Taken in detail on the worksheet, negative except as  outlined above.  She did note that she does have occasional cough but  this is no change in baseline.   PHYSICAL EXAMINATION:  This is an anxious white female who initially had  a difficulty answering questions in a straightforward fashion and  typically would jump from one symptom to the other before finishing the  history on a single symptom so that we went from talking about dyspnea  to chest pain to palpitations to cough and then back again with no  organization at all chronologically.  HEENT:  Unremarkable, oropharynx clear.  NECK:  Supple without cervical adenopathy, tenderness, trachea is  midline, no thyromegaly.  LUNGS:  Fields are completely clear bilaterally to auscultation and  percussion.  There is no cough  elicited on inspiratory or expiratory  techniques nor any pain elicited.  Regular rhythm without murmur, gallop, rub.  No increase in P2.  No  chest wall tenderness.  ABDOMEN:  Soft, benign.  EXTREMITIES:  Warm without calf tenderness, cyanosis, clubbing, edema.   Heme saturation 93% on room air.   LABORATORY DATA:  Included a BMET, TSH, a CBC, a sed rate and a D-dimer  all of which are pending.  CT scan of the chest is also pending.   IMPRESSION:  Multiple complaints dating back over a year for which I  doubt we will find a unifying diagnosis other than possible depression.  The chest discomfort had no pleuritic features, was not associated with  cough and was nonexertional related and has been present fro several  weeks now although waning a bit without any specific therapy.  One  potential unifying diagnosis would be reflux to explain everything but  her palpitations (which may have been exacerbated by the use of  bronchodilators).  I therefore  reviewed a gastroesophageal reflux  disease diet with her and asked her start on Zegerid 40 mg at bedtime  while awaiting the results of the above studies which will include a CT  scan of the chest looking for evidence of any occult interstitial lung  disease or thromboembolic disease.   I have arranged to see her back in 4-6 weeks for PFTs.     Charlaine Dalton. Sherene Sires, MD, [facility]  Electronically Signed    MBW/MedQ  DD: 04/02/2007  DT: 04/02/2007  Job #: [id number]   cc:   Darrin Nipper, MD

## 2010-10-09 NOTE — Discharge Summary (Signed)
NAMECAROLA, VIRAMONTES                  ACCOUNT NO.:  [id number]   MEDICAL RECORD NO.:  [id number]          PATIENT TYPE:  OBV   LOCATION:  4707                         FACILITY:  [facility]   PHYSICIAN:  Valerie A. Felicity Coyer, MDDATE OF BIRTH:  1950/06/01   DATE OF ADMISSION:  05/10/2008  DATE OF DISCHARGE:  05/11/2008                               DISCHARGE SUMMARY   DISCHARGE DIAGNOSES:  1. Atypical chest pain, shortness of breath.  2. Hyperlipidemia.  3. Insomnia.  4. Depression/anxiety.  5. Anemia.  6. History allergic rhinitis.  7. History of hypertension.   HISTORY OF PRESENT ILLNESS:  Ms. Strauss is a 60 year old white female who  was admitted on May 10, 2008, with chief complaint of shortness of  breath and chest pain.  She presented to the ER, noting chest pressure,  which radiated to her back.  She stated this discomfort awoke her in the  middle of the night.  She was not certain that it was accompanied by  shortness of breath, but the discomfort persisted for 5-10 minutes and  then the patient fell back to sleep.  When she woke this morning, the  symptoms were still present that were better.  She does note positive  tingling in bilateral arms and legs and she woke on the a.m. of  admission.  She denied any associated jaw pain or nausea.  She did note  positive family history of coronary artery disease and recent stress  test 12 months ago, which was negative for ischemia.  She was admitted  for further evaluation and treatment.   COURSE OF HOSPITALIZATION:  1. Atypical chest.  The patient was admitted.  She underwent serial      cardiac enzymes, which were negative x3.  She also had a D-dimer      performed, which was negative.  She was stable sinus rhythm on      telemetry, lipids currently at goal on current statin dose.  We did      check a TSH as she notes a general malaise for last 1-1/2 month.      TSH was within normal limits.  A 2D echo also noted a normal LV  function.  2. Depression/anxiety.  Suspect this is the patient's primary issue.      She notes that I just felt bad all the time.  On admission with      general malaise, she also notes that she occasionally has periods      where she has difficulty getting the words out, which seemed most      consistent with anxiety as she has no other neurological changes.      She did not have any of those episodes during this admission;      however, defer further workup to the patient's primary MD.  She      made benefit from addition of SSRI.   MEDICATIONS AT THE TIME OF DISCHARGE:  1. Nasacort AQ 2 sprays twice daily.  2. Simvastatin 80 mg p.o. nightly.  3. Premarin 0.5 mg per 24 hours and vaginally to  be changed every 3      months.  4. Caltrate 1 tablet p.o. daily.  5. Fiorinal 50/320/40 daily as needed.  6. Amitriptyline 50 mg p.o. nightly.  7. Avelox 400 mg p.o. daily to complete course as prior to admission.      The patient is status post 4 days of Avelox.   PERTINENT LABORATORIES AT THE TIME OF DISCHARGE:  D-dimer negative.  TSH  2.26, hemoglobin 12.7, hematocrit 38.6.   DISPOSITION:  The patient was discharged to home.   FOLLOWUP:  He is to follow up with Dr. Oliver Barre on Wednesday May 18, 2008, at 11 o'clock a.m.  She was noted to have a slight elevation  of white blood cell count today.  However she to take her prednisone  yesterday and has been afebrile.  Consider followup CBC to ensure  normalization of white count at times.  She is instructed to call Dr.  Jonny Ruiz should she develop fever greater than 101 or increased weakness.   Greater than 30 minutes were spent on discharge planning.      Sandford Craze, NP      Raenette Rover. Felicity Coyer, MD  Electronically Signed    MO/MEDQ  D:  05/11/2008  T:  05/12/2008  Job:  [id number]

## 2010-10-09 NOTE — Assessment & Plan Note (Signed)
[facility]                             PULMONARY OFFICE NOTE   NAME:[name], [name]                         MRN:          [id number]  DATE:04/30/2007                            DOB:          [birth year]    PULMONARY EXTENDED OFFICE VISIT:   HISTORY:  This is an exceptionally challenging 60 year old white female  seen on April 02, 2007 with multiple complaints that I thought might  be GERD related versus functional.  I asked her to take Zegerid 40 mg  daily for the next 4 weeks and return for followup.  She only took it 2  weeks and said it did not work and stopped it.  Her complaints are as  follows:  Chest pain that waxes and wanes over the last several months,  with no definite relation to activity, coughing, breathing, bowel or  bladder habits, or eating.  It always occurs in the same place, to the  right of midline, and radiates up into her right shoulder and back.  She  says she had it 7 years ago, and it was worse then than it is now, but  nothing specific was identified.  In addition to this complaint, she has  noticed increasing dyspnea, especially going up hills for the last 2  months, but previously repeatedly denied any increased dyspnea with  exertion.  This is distinctly differently from her previous history,  which was that she was having shortness of breath at rest that I  documented on April 02, 2007.   In addition to these complaints, she has been having increasing cough.   PHYSICAL EXAMINATION:  She is a somewhat anxious white female in no  acute distress.  She had stable vital signs.  VITAL SIGNS:  Stable vital signs.  HEENT:  Unremarkable.  Oropharynx is clear.  LUNGS:  The lung fields are perfectly clear bilaterally to auscultation  and percussion.  CARDIAC:  Regular rate and rhythm without murmur, gallop, or rub.  ABDOMEN:  Soft and benign.  EXTREMITIES:  Warm without calf tenderness, cyanosis, clubbing, or  edema.   IMPRESSION:  1. Atypical chest pain for the last 2 months with no definite pattern      as to timing or associated signs and symptoms.  Always occurs in      the same place, and actually is similar to what she had 7 years ago      with a negative workup then.  Having done an extensive workup now,      I think most likely the cause of this pain is irritable bowel      syndrome and recommended Citrucel teaspoon twice daily for a full      month before considering any additional workup.  2. Chronic dyspnea with exertion has been also extensively evaluated,      and I believe is nothing more than deconditioning.  3. Chronic cough for the last 6 months, probably is related to reflux.      I recommended that she continue to take PPI therapy daily and  reviewed with her dietary restrictions in detail.  4. I strongly feel there is a functional component to her problems,      but did not specifically address this issue, noting that the      patient is already taking Elavil, but may need her      medications adjusted by Dr. Jonny Ruiz.  Chart review indicated that she      had not had a recent      stress Cardiolite, and because she had reproducible symptoms with      exertion, I did recommend that it be done to complete the workup,      but nothing else to add at this point.     Charlaine Dalton. Sherene Sires, MD, [facility]  Electronically Signed    MBW/MedQ  DD: 04/30/2007  DT: 05/01/2007  Job #: [id number]

## 2010-10-09 NOTE — Assessment & Plan Note (Signed)
[facility]                             PULMONARY OFFICE NOTE   NAME:Malacara, [name]                         MRN:          [id number]  DATE:04/30/2007                            DOB:          09/01/1950    ADDENDUM:   PHYSICAL EXAMINATION:  She is a somewhat anxious white female in no  acute distress.  She had stable vital signs.  VITAL SIGNS:  Stable vital signs.  HEENT:  Unremarkable.  Oropharynx is clear.  LUNGS:  The lung fields are perfectly clear bilaterally to auscultation  and percussion.  CARDIAC:  Regular rate and rhythm without murmur, gallop, or rub.  ABDOMEN:  Soft and benign.  EXTREMITIES:  Warm without calf tenderness, cyanosis, clubbing, or  edema.   IMPRESSION:  1. Atypical chest pain for the last 2 months with no definite pattern      as to timing or associated signs and symptoms.  Always occurs in      the same place, and actually is similar to what she had 7 years ago      with a negative workup then.  Having done an extensive workup now,      I think most likely the cause of this pain is irritable bowel      syndrome and recommended Citrucel teaspoon twice daily for a full      month before considering any additional workup.  2. Chronic dyspnea with exertion has been also extensively evaluated,      and I believe is nothing more than deconditioning.  3. Chronic cough for the last 6 months, probably is related to reflux.      I recommended that she continue to take PPI therapy daily and      reviewed with her dietary restrictions in detail.  4. I strongly feel there is a functional component to her problems,      but would not specifically address this issue, noting that the      patient is already taking Elavil, but may need her medications      adjusted by Dr. Jonny Ruiz.  Chart review indicated that she had not had      a recent stress Cardiolite, and because she had reproducible      symptoms with exertion, I did recommend that  it be done to complete      the workup, but nothing further to add at this point.     [name]. Samantha Sires, MD, [facility]     MBW/MedQ  DD: 05/29/2007  DT: 05/29/2007  Job #: [id number]

## 2011-01-07 ENCOUNTER — Encounter: Payer: Self-pay | Admitting: *Deleted

## 2011-01-07 ENCOUNTER — Other Ambulatory Visit (INDEPENDENT_AMBULATORY_CARE_PROVIDER_SITE_OTHER): Payer: BC Managed Care – PPO

## 2011-01-07 ENCOUNTER — Ambulatory Visit (INDEPENDENT_AMBULATORY_CARE_PROVIDER_SITE_OTHER): Payer: BC Managed Care – PPO | Admitting: Internal Medicine

## 2011-01-07 ENCOUNTER — Telehealth: Payer: Self-pay | Admitting: Gastroenterology

## 2011-01-07 ENCOUNTER — Encounter: Payer: Self-pay | Admitting: Internal Medicine

## 2011-01-07 ENCOUNTER — Other Ambulatory Visit: Payer: Self-pay | Admitting: *Deleted

## 2011-01-07 VITALS — BP 100/72 | HR 86 | Temp 98.4°F | Ht 61.0 in | Wt 146.0 lb

## 2011-01-07 DIAGNOSIS — K5732 Diverticulitis of large intestine without perforation or abscess without bleeding: Secondary | ICD-10-CM

## 2011-01-07 LAB — HEPATIC FUNCTION PANEL
ALT: 38 U/L — ABNORMAL HIGH (ref 0–35)
Albumin: 4.5 g/dL (ref 3.5–5.2)
Bilirubin, Direct: 0.1 mg/dL (ref 0.0–0.3)
Total Bilirubin: 0.6 mg/dL (ref 0.3–1.2)
Total Protein: 7.7 g/dL (ref 6.0–8.3)

## 2011-01-07 LAB — CBC WITH DIFFERENTIAL/PLATELET
Eosinophils Relative: 2.9 % (ref 0.0–5.0)
Lymphocytes Relative: 31.2 % (ref 12.0–46.0)
MCV: 88.1 fl (ref 78.0–100.0)
Monocytes Absolute: 0.6 10*3/uL (ref 0.1–1.0)
Neutrophils Relative %: 58.5 % (ref 43.0–77.0)
Platelets: 307 10*3/uL (ref 150.0–400.0)
RBC: 4.74 Mil/uL (ref 3.87–5.11)
WBC: 8.4 10*3/uL (ref 4.5–10.5)

## 2011-01-07 LAB — BASIC METABOLIC PANEL
BUN: 10 mg/dL (ref 6–23)
CO2: 28 mEq/L (ref 19–32)
Glucose, Bld: 94 mg/dL (ref 70–99)

## 2011-01-07 MED ORDER — METRONIDAZOLE 500 MG PO TABS: 500.0000 mg | ORAL_TABLET | Freq: Three times a day (TID) | ORAL | Status: AC

## 2011-01-07 MED ORDER — FEXOFENADINE HCL 180 MG PO TABS: 180.0000 mg | ORAL_TABLET | Freq: Every day | ORAL | Status: DC | PRN

## 2011-01-07 MED ORDER — CIPROFLOXACIN HCL 500 MG PO TABS: 500.0000 mg | ORAL_TABLET | Freq: Two times a day (BID) | ORAL | Status: AC

## 2011-01-07 MED ORDER — PROMETHAZINE HCL 25 MG PO TABS: 25.0000 mg | ORAL_TABLET | Freq: Four times a day (QID) | ORAL | Status: AC | PRN

## 2011-01-07 NOTE — Telephone Encounter (Signed)
Left message for patient to call back  

## 2011-01-07 NOTE — Progress Notes (Signed)
  Subjective:    [name], female    DOB: [birth year], 60 y.o.   MRN: [id number]  HPI complains of diarrhea losse BM 1-2x/day (usually constipated with hard small BM every 3d) Onset >7 days ago associated with with nausea but no vomiting Also LLQ cramping and discomfort No fever, no BRBPR, no recent travel, no recent antibiotics  Not imporved with OTC meds for GI upset Mild chest discomfort yesterday with BM, but no chest pain now - hx same  Past Medical History  Diagnosis Date  . ALLERGIC RHINITIS 01/24/2007  . [name] 11/17/2007  . ANXIETY 11/17/2007  . ASTHMA 07/19/2008  . DEPRESSION 01/24/2007  . HYPERLIPIDEMIA 01/26/2007  . COMMON MIGRAINE   . [name]   . DIVERTICULOSIS, COLON     Review of Systems  Constitutional: Positive for fatigue.  Respiratory: Negative for cough and shortness of breath.   Gastrointestinal: Negative for abdominal distention.  Musculoskeletal: Negative for back pain.       Objective:   Physical Exam BP 100/72  Pulse 86  Temp(Src) 98.4 F (36.9 C) (Oral)  Ht 5\' 1"  (1.549 m)  Wt 146 lb (66.225 kg)  BMI 27.59 kg/m2  SpO2 97% Constitutional: She is oriented to person, place, and time. She appears well-developed and well-nourished. No distress.  Eyes: Conjunctivae and EOM are normal. Pupils are equal, round, and reactive to light. No scleral icterus.  Neck: Normal range of motion. Neck supple. No JVD present. No thyromegaly present.  Cardiovascular: Normal rate, regular rhythm and normal heart sounds.  No murmur heard. No BLE edema. Pulmonary/Chest: Effort normal and breath sounds normal. No respiratory distress. She has no wheezes.  Abdominal: Soft. Bowel sounds are normal. She exhibits no distension. There is no tenderness. no masses Psychiatric: She has a normal mood and affect. Her behavior is normal. Judgment and thought content normal.   Lab Results  Component Value Date   WBC 6.9 04/25/2010    HGB 13.1 04/25/2010   HCT 40.8 04/25/2010   PLT 352 04/25/2010   CHOL 189 04/06/2010   TRIG 339.0* 04/06/2010   HDL 73.70 04/06/2010   LDLDIRECT 137.0 04/06/2010   ALT 28 01/25/2009   AST 28 01/25/2009   NA 143 04/06/2010   K 4.6 04/06/2010   CL 103 04/06/2010   CREATININE 0.8 04/06/2010   BUN 13 04/06/2010   CO2 33* 04/06/2010   TSH 1.89 04/06/2010   HGBA1C 5.8 01/25/2009      Assessment & Plan:  LLQ diverticulitis - Nausea/diarrhea - ongoing >7 days - suspect diverticulitis based on colo 06/2006 findings (diverticulosis) and symptoms - check labs, start empiric cipro+flagyl and prn promethazine - work note for today/tomorrow per pt request - done - will call if unimproved, sooner if worse

## 2011-01-07 NOTE — Patient Instructions (Signed)
It was good to see you today. Your symptoms appear due to diverticulitis -  Cipro + Flagyl antibiotics to treat same - also promethazine as needed for nausea symptoms - Your prescription(s) have been submitted to your pharmacy. Please take as directed and contact our office if you believe you are having problem(s) with the medication(s). Test(s) ordered today. Your results will be called to you after review (48-72hours after test completion). If any changes need to be made, you will be notified at that time. Work note for today and tomorrow as requested if your symptoms continue to worsen (pain, fever, etc), or if you are unable take anything by mouth (pills, fluids, etc), you should go to the emergency room for further evaluation and treatment. Please schedule follow up with Dr. Jonny Ruiz in 3-4 months for routine med review, call sooner if problems.

## 2011-01-07 NOTE — Telephone Encounter (Signed)
Patient was seen by her primary care MD today and was diagnosed with Diverticulitis.  She will call back if she needs an appt in the future if her symptoms don't improve.

## 2011-01-18 ENCOUNTER — Telehealth: Payer: Self-pay | Admitting: Gastroenterology

## 2011-01-18 ENCOUNTER — Telehealth: Payer: Self-pay

## 2011-01-18 NOTE — Telephone Encounter (Signed)
Besides the recommendation for align otc, I would not have further to recommend at this time

## 2011-01-18 NOTE — Telephone Encounter (Signed)
Pt called stating she is still having GI sxs and does not have appt with GI for another week. Pt is requesting advisement from JWJ.  Please see telephone encounter from today with pt and GI.

## 2011-01-18 NOTE — Telephone Encounter (Signed)
Patient was diagnosed with diverticulitis by her primary care.  She has finished her cipro and Flagyl on Sunday.  The LLQ pain has resolved, but she does c/o diarrhea several times a day.  She describes the stools as mushy.  She has no abdominal pain, cramping, n&v, rectal bleeding or other GI complaints.  I have asked her to try Align 1 a day and have given her some samples.  I have given her an appt for Dr Russella Dar for 01/24/11.  She will cancel if her symptoms resolve in the mean time.

## 2011-01-18 NOTE — Telephone Encounter (Signed)
Is she a new patient? If so we should have PCP advise on treatment until we see her.

## 2011-01-18 NOTE — Telephone Encounter (Signed)
Left message for patient to call back  

## 2011-01-18 NOTE — Telephone Encounter (Signed)
Patient advised of Dr. Stark's recommendations. 

## 2011-01-21 NOTE — Telephone Encounter (Signed)
Pt states that she was advised by GI not to take OTC medication for her stomach and her appt was moved to 08/30.

## 2011-01-24 ENCOUNTER — Ambulatory Visit (INDEPENDENT_AMBULATORY_CARE_PROVIDER_SITE_OTHER): Payer: BC Managed Care – PPO | Admitting: Gastroenterology

## 2011-01-24 ENCOUNTER — Encounter: Payer: Self-pay | Admitting: Gastroenterology

## 2011-01-24 VITALS — BP 110/60 | HR 80 | Ht 61.0 in | Wt 141.0 lb

## 2011-01-24 DIAGNOSIS — R197 Diarrhea, unspecified: Secondary | ICD-10-CM

## 2011-01-24 DIAGNOSIS — K5900 Constipation, unspecified: Secondary | ICD-10-CM

## 2011-01-24 NOTE — Patient Instructions (Addendum)
Constipation in Adults Constipation is having fewer than 2 bowel movements per week. Usually, the stools are hard. As we grow older, constipation is more common. If you try to fix constipation with laxatives, the problem may get worse. This is because laxatives taken over a long period of time make the colon muscles weaker. A low-fiber diet, not taking in enough fluids, and taking some medicines may make these problems worse. MEDICATIONS THAT MAY CAUSE CONSTIPATION  Water pills (diuretics).  Calcium channel blockers (used to control blood pressure and for the heart).   Certain pain medicines (narcotics).   Anticholinergics.  Anti-inflammatory agents.   Antacids that contain aluminum.   DISEASES THAT CONTRIBUTE TO CONSTIPATION  Diabetes.  Parkinson's disease.   Dementia.   Stroke.  Depression.   Illnesses that cause problems with salt and water metabolism.   HOME CARE INSTRUCTIONS  Constipation is usually best cared for without medicines. Increasing dietary fiber and eating more fruits and vegetables is the best way to manage constipation.   Slowly increase fiber intake to 25 to 38 grams per day. Whole grains, fruits, vegetables, and legumes are good sources of fiber. A dietitian can further help you incorporate high-fiber foods into your diet.   Drink enough water and fluids to keep your urine clear or pale yellow.   A fiber supplement may be added to your diet if you cannot get enough fiber from foods.   Increasing your activities also helps improve regularity.   Suppositories, as suggested by your caregiver, will also help. If you are using antacids, such as aluminum or calcium containing products, it will be helpful to switch to products containing magnesium if your caregiver says it is okay.   If you have been given a liquid injection (enema) today, this is only a temporary measure. It should not be relied on for treatment of longstanding (chronic) constipation.    Stronger measures, such as magnesium sulfate, should be avoided if possible. This may cause uncontrollable diarrhea. Using magnesium sulfate may not allow you time to make it to the bathroom.  SEEK IMMEDIATE MEDICAL CARE IF:  There is bright red blood in the stool.   The constipation stays for more than 4 days.   There is belly (abdominal) or rectal pain.   You do not seem to be getting better.   You have any questions or concerns.  MAKE SURE YOU:  Understand these instructions.   Will watch your condition.   Will get help right away if you are not doing well or get worse.  Document Released: 02/09/2004 Document Re-Released: 08/07/2009 [facility] Patient Information 2011 [place], [place].  High fiber diet given to patient.  cc: Oliver Barre, MD

## 2011-01-24 NOTE — Progress Notes (Signed)
.  History of Present Illness: This is a 60 year old female who relates the onset of diarrhea and mild left lower quadrant pain. Her symptoms began about 4 weeks ago. She describes 1 to 2 loose bowel movements per day with mild left lower quadrant pain. She was treated with Cipro and Flagyl for possible diverticulitis. Over the past week her bowels have returned to her normal pattern of 1 to 2 bowel movements per week with associated mild left lower quadrant discomfort just before bowel movement. She underwent colonoscopy for routine screening in February 2008 showing sigmoid diverticulosis and internal hemorrhoids. Blood work done 2 weeks ago was unremarkable. Denies weight loss, change in stool caliber, melena, hematochezia, nausea, vomiting, dysphagia, reflux symptoms, chest pain.  Current Medications, Allergies, Past Medical History, Past Surgical History, Family History and Social History were reviewed in [employer] record.  Physical Exam: General: Well developed , well nourished, no acute distress Head: Normocephalic and atraumatic Eyes:  sclerae anicteric, EOMI Ears: Normal auditory acuity Mouth: No deformity or lesions Lungs: Clear throughout to auscultation Heart: Regular rate and rhythm; no murmurs, rubs or bruits Abdomen: Soft, nontender, nondistended, normoactive bowel sounds. No palpable organomegaly masses or hernias.  Musculoskeletal: Symmetrical with no gross deformities  Pulses:  Normal pulses noted Extremities: No clubbing, cyanosis, edema or deformities noted Neurological: Alert oriented x 4, grossly nonfocal Psychological:  Alert and cooperative. Normal mood and affect  Assessment and Recommendations:  1. Recent left lower quadrant pain associated with diarrhea. She may have mild diverticulitis or an infectious diarrhea. Her bowel habits have returned to her normal pattern of chronic constipation. Advised to increase her fiber and water intake. Consider  a mild laxative such as MiraLax if she continues to have infrequent bowel movements associated with left lower quadrant abdominal cramping. Consider further evaluation with colonoscopy if her symptoms do not come under good control.

## 2011-01-25 ENCOUNTER — Encounter: Payer: Self-pay | Admitting: Gastroenterology

## 2011-01-29 ENCOUNTER — Other Ambulatory Visit: Payer: Self-pay | Admitting: Internal Medicine

## 2011-01-30 NOTE — Telephone Encounter (Signed)
Both done per emr

## 2011-03-01 LAB — DIFFERENTIAL
Lymphocytes Relative: 16 % (ref 12–46)
Monocytes Absolute: 0.7 10*3/uL (ref 0.1–1.0)
Monocytes Relative: 6 % (ref 3–12)
Neutro Abs: 9.3 10*3/uL — ABNORMAL HIGH (ref 1.7–7.7)

## 2011-03-01 LAB — TSH: TSH: 2.256 u[IU]/mL (ref 0.350–4.500)

## 2011-03-01 LAB — POCT CARDIAC MARKERS: Myoglobin, poc: 32 ng/mL (ref 12–200)

## 2011-03-01 LAB — BASIC METABOLIC PANEL
CO2: 27 mEq/L (ref 19–32)
Calcium: 9 mg/dL (ref 8.4–10.5)
GFR calc Af Amer: >60 mL/min (ref >60)
GFR calc non Af Amer: >60 mL/min (ref >60)
Potassium: 4.2 mEq/L (ref 3.5–5.1)
Sodium: 141 mEq/L (ref 135–145)

## 2011-03-01 LAB — CARDIAC PANEL(CRET KIN+CKTOT+MB+TROPI)
CK, MB: 0.9 ng/mL (ref 0.3–4.0)
Relative Index: INVALID (ref 0.0–2.5)
Relative Index: INVALID (ref 0.0–2.5)
Relative Index: INVALID (ref 0.0–2.5)
Total CK: 34 U/L (ref 7–177)
Total CK: 38 U/L (ref 7–177)
Total CK: 53 U/L (ref 7–177)
Troponin I: <0.01 ng/mL (ref 0.00–0.06)
Troponin I: <0.01 ng/mL (ref 0.00–0.06)

## 2011-03-01 LAB — CBC
HCT: 40.2 % (ref 36.0–46.0)
Hemoglobin: 13.4 g/dL (ref 12.0–15.0)
MCHC: 32.9 g/dL (ref 30.0–36.0)
MCHC: 33.3 g/dL (ref 30.0–36.0)
MCV: 89 fL (ref 78.0–100.0)
Platelets: 322 10*3/uL (ref 150–400)
RBC: 4.34 MIL/uL (ref 3.87–5.11)
RBC: 4.53 MIL/uL (ref 3.87–5.11)
RDW: 12.7 % (ref 11.5–15.5)

## 2011-03-01 LAB — LIPID PANEL
Cholesterol: 163 mg/dL (ref 0–200)
LDL Cholesterol: 52 mg/dL (ref 0–99)

## 2011-03-01 LAB — CK TOTAL AND CKMB (NOT AT ARMC): Relative Index: INVALID (ref 0.0–2.5)

## 2011-03-01 LAB — PROTIME-INR: INR: 1 (ref 0.00–1.49)

## 2011-03-01 LAB — D-DIMER, QUANTITATIVE: D-Dimer, Quant: <0.22 ug/mL-FEU (ref 0.00–0.48)

## 2011-03-01 LAB — TROPONIN I: Troponin I: <0.01 ng/mL (ref 0.00–0.06)

## 2011-04-17 ENCOUNTER — Other Ambulatory Visit: Payer: Self-pay

## 2011-04-17 MED ORDER — BUTALBITAL-ASPIRIN-CAFFEINE 50-325-40 MG PO CAPS: 1.0000 | ORAL_CAPSULE | Freq: Every day | ORAL | Status: DC | PRN

## 2011-05-03 ENCOUNTER — Telehealth: Payer: Self-pay | Admitting: Gastroenterology

## 2011-05-03 ENCOUNTER — Other Ambulatory Visit (INDEPENDENT_AMBULATORY_CARE_PROVIDER_SITE_OTHER): Payer: BC Managed Care – PPO

## 2011-05-03 DIAGNOSIS — R1030 Lower abdominal pain, unspecified: Secondary | ICD-10-CM

## 2011-05-03 DIAGNOSIS — R109 Unspecified abdominal pain: Secondary | ICD-10-CM

## 2011-05-03 LAB — CBC WITH DIFFERENTIAL/PLATELET
Basophils Absolute: 0 10*3/uL (ref 0.0–0.1)
Basophils Relative: 0.3 % (ref 0.0–3.0)
Eosinophils Absolute: 0.1 10*3/uL (ref 0.0–0.7)
Lymphocytes Relative: 23.3 % (ref 12.0–46.0)
MCHC: 33.8 g/dL (ref 30.0–36.0)
MCV: 88.4 fl (ref 78.0–100.0)
Monocytes Absolute: 0.5 10*3/uL (ref 0.1–1.0)
Neutrophils Relative %: 69.6 % (ref 43.0–77.0)
RDW: 13.1 % (ref 11.5–14.6)

## 2011-05-03 MED ORDER — CIPROFLOXACIN HCL 500 MG PO TABS: 500.0000 mg | ORAL_TABLET | Freq: Two times a day (BID) | ORAL | Status: AC

## 2011-05-03 MED ORDER — METRONIDAZOLE 500 MG PO TABS: 500.0000 mg | ORAL_TABLET | Freq: Two times a day (BID) | ORAL | Status: AC

## 2011-05-03 NOTE — Telephone Encounter (Signed)
Patient was treated in August for diverticulitis.  She reports pain that started this am bilateral lower quad.  She denies diarrhea or constipation, fever, nausea or vomiting.  She reports pain is very similar to diverticulitis pain in August.  Dr Jarold Motto you are MD of the day.  She is requesting we call in cipro and flagyl as last time.  She is advised to start herself on a clear liquid diet for the next 24-48 hours and then can advance. Dr Jarold Motto please

## 2011-05-03 NOTE — Telephone Encounter (Signed)
i agree...drp

## 2011-05-03 NOTE — Telephone Encounter (Signed)
Patient advised of CT scan scheduled for Monday at 1:30 for [place] CT scanner she is aware to come for lab work and pick up antibiotics

## 2011-05-03 NOTE — Telephone Encounter (Signed)
Discussed with Dr Jarold Motto.  Patient to have CT scan to rule out diverticular abscess, she also needs SED, CBC, cipro 500 mg 1 po bid for 10 days, flagyl 500 mg 1 po BID for 10 days

## 2011-05-06 ENCOUNTER — Ambulatory Visit (INDEPENDENT_AMBULATORY_CARE_PROVIDER_SITE_OTHER)
Admission: RE | Admit: 2011-05-06 | Discharge: 2011-05-06 | Disposition: A | Discharge status: Home / Self Care | Payer: BC Managed Care – PPO | Source: Ambulatory Visit | Attending: Gastroenterology | Admitting: Gastroenterology

## 2011-05-06 DIAGNOSIS — R109 Unspecified abdominal pain: Secondary | ICD-10-CM

## 2011-05-06 DIAGNOSIS — R1030 Lower abdominal pain, unspecified: Secondary | ICD-10-CM

## 2011-05-06 MED ORDER — IOHEXOL 300 MG/ML  SOLN
100.0000 mL | Freq: Once | INTRAMUSCULAR | Status: AC | PRN
  Administered 2011-05-06: 100 mL via INTRAVENOUS

## 2011-05-07 ENCOUNTER — Telehealth: Payer: Self-pay | Admitting: Gastroenterology

## 2011-05-07 NOTE — Telephone Encounter (Signed)
Patient advised of CT results see note on CT scan

## 2011-05-20 ENCOUNTER — Telehealth: Payer: Self-pay

## 2011-05-20 MED ORDER — BUTALBITAL-ASPIRIN-CAFFEINE 50-325-40 MG PO CAPS: 1.0000 | ORAL_CAPSULE | Freq: Every day | ORAL | Status: DC | PRN

## 2011-05-20 NOTE — Telephone Encounter (Signed)
Faxed hardcopy to [place] 

## 2011-05-20 NOTE — Telephone Encounter (Signed)
Done escript 

## 2011-05-20 NOTE — Telephone Encounter (Signed)
Patient requesting to increase Butalbital, patient got #60 last feb. And would like #60 to be filled, please advise

## 2011-07-09 ENCOUNTER — Telehealth: Payer: Self-pay

## 2011-07-09 MED ORDER — HYDROCODONE-HOMATROPINE 5-1.5 MG/5ML PO SYRP: 5.0000 mL | ORAL_SOLUTION | Freq: Four times a day (QID) | ORAL | Status: DC | PRN

## 2011-07-09 MED ORDER — OSELTAMIVIR PHOSPHATE 75 MG PO CAPS: 75.0000 mg | ORAL_CAPSULE | Freq: Two times a day (BID) | ORAL | Status: AC

## 2011-07-09 NOTE — Telephone Encounter (Signed)
Faxed hardcopy to [facility], called the patient and informed of all information.

## 2011-07-09 NOTE — Telephone Encounter (Signed)
Ok for tamiflu for presumed influenza

## 2011-07-09 NOTE — Telephone Encounter (Signed)
Not sure without seeing her;  I would say approx 2-3 days, ok for cough med as well - Done hardcopy to robin

## 2011-07-09 NOTE — Telephone Encounter (Signed)
Patient would like to know when to return to work as has fever. Also does she need something for the cough?

## 2011-07-09 NOTE — Telephone Encounter (Signed)
Patient called with flu symptoms, body aches, fever and cough. She would like something called in to [facility]. Call back number is [phone] please advise

## 2011-07-18 ENCOUNTER — Ambulatory Visit (INDEPENDENT_AMBULATORY_CARE_PROVIDER_SITE_OTHER): Payer: BC Managed Care – PPO | Admitting: Internal Medicine

## 2011-07-18 ENCOUNTER — Telehealth: Payer: Self-pay | Admitting: *Deleted

## 2011-07-18 ENCOUNTER — Ambulatory Visit (INDEPENDENT_AMBULATORY_CARE_PROVIDER_SITE_OTHER)
Admission: RE | Admit: 2011-07-18 | Discharge: 2011-07-18 | Disposition: A | Discharge status: Home / Self Care | Payer: BC Managed Care – PPO | Source: Ambulatory Visit | Attending: Internal Medicine | Admitting: Internal Medicine

## 2011-07-18 VITALS — BP 108/78 | HR 88 | Temp 97.6°F | Resp 16 | Wt 153.0 lb

## 2011-07-18 DIAGNOSIS — R05 Cough: Secondary | ICD-10-CM | POA: Insufficient documentation

## 2011-07-18 DIAGNOSIS — R059 Cough, unspecified: Secondary | ICD-10-CM | POA: Insufficient documentation

## 2011-07-18 DIAGNOSIS — J209 Acute bronchitis, unspecified: Secondary | ICD-10-CM | POA: Insufficient documentation

## 2011-07-18 MED ORDER — MOXIFLOXACIN HCL 400 MG PO TABS: 400.0000 mg | ORAL_TABLET | Freq: Every day | ORAL | Status: AC

## 2011-07-18 MED ORDER — HYDROCODONE-HOMATROPINE 5-1.5 MG/5ML PO SYRP: 5.0000 mL | ORAL_SOLUTION | Freq: Four times a day (QID) | ORAL | Status: DC | PRN

## 2011-07-18 NOTE — Patient Instructions (Signed)

## 2011-07-18 NOTE — Telephone Encounter (Signed)
Notified pt CXR shows no pneumonia and to start antibiotic as prescribed per Dr Yetta Barre. Pt requests work note and wants to know how long she should remain out of work. Please advise.

## 2011-07-18 NOTE — Telephone Encounter (Signed)
Patient notified and letter mailed per her request.

## 2011-07-18 NOTE — Telephone Encounter (Signed)
Note written

## 2011-07-19 ENCOUNTER — Encounter: Payer: Self-pay | Admitting: Internal Medicine

## 2011-07-19 NOTE — Assessment & Plan Note (Signed)
I will check her CXR to look for pna, mass, edema 

## 2011-07-19 NOTE — Progress Notes (Signed)
  Subjective:    [name], female    DOB: 04-Aug-1950, 61 y.o.   MRN: [id number]  Cough This is a new problem. The current episode started 1 to 4 weeks ago. The problem has been gradually worsening. The problem occurs every few hours. The cough is productive of purulent sputum. Associated symptoms include chills, a fever and a sore throat. Pertinent negatives include no chest pain, ear congestion, ear pain, headaches, heartburn, hemoptysis, myalgias, nasal congestion, postnasal drip, rash, rhinorrhea, shortness of breath, sweats, weight loss or wheezing. The symptoms are aggravated by nothing. She has tried prescription cough suppressant for the symptoms. The treatment provided moderate relief. Her past medical history is significant for asthma.      Review of Systems  Constitutional: Positive for fever and chills. Negative for weight loss, diaphoresis, activity change, appetite change, fatigue and unexpected weight change.  HENT: Positive for sore throat. Negative for ear pain, nosebleeds, congestion, rhinorrhea, sneezing, drooling, mouth sores, trouble swallowing, dental problem, voice change, postnasal drip and sinus pressure.   Eyes: Negative.   Respiratory: Positive for cough. Negative for apnea, hemoptysis, choking, chest tightness, shortness of breath, wheezing and stridor.   Cardiovascular: Negative for chest pain, palpitations and leg swelling.  Gastrointestinal: Negative for heartburn.  Genitourinary: Negative.   Musculoskeletal: Negative for myalgias, joint swelling, arthralgias and gait problem.  Skin: Negative for color change, pallor, rash and wound.  Neurological: Negative.  Negative for headaches.  Hematological: Negative for adenopathy. Does not bruise/bleed easily.  Psychiatric/Behavioral: Negative.        Objective:   Physical Exam  Vitals reviewed. Constitutional: She is oriented to person, place, and time. She appears well-developed and well-nourished.  No distress.  HENT:  Head: Normocephalic and atraumatic.  Mouth/Throat: Oropharynx is clear and moist. No oropharyngeal exudate.  Eyes: Conjunctivae are normal. Right eye exhibits no discharge. Left eye exhibits no discharge. No scleral icterus.  Neck: Normal range of motion. Neck supple. No JVD present. No tracheal deviation present. No thyromegaly present.  Cardiovascular: Normal rate, regular rhythm, normal heart sounds and intact distal pulses.  Exam reveals no gallop and no friction rub.   No murmur heard. Pulmonary/Chest: Effort normal and breath sounds normal. No stridor. No respiratory distress. She has no wheezes. She has no rales. She exhibits no tenderness.  Abdominal: Soft. Bowel sounds are normal. She exhibits no distension and no mass. There is no tenderness. There is no rebound and no guarding.  Musculoskeletal: Normal range of motion. She exhibits no edema and no tenderness.  Lymphadenopathy:    She has no cervical adenopathy.  Neurological: She is oriented to person, place, and time.  Skin: Skin is warm and dry. No rash noted. She is not diaphoretic. No erythema. No pallor.  Psychiatric: She has a normal mood and affect. Her behavior is normal. Judgment and thought content normal.          Assessment & Plan:

## 2011-07-19 NOTE — Assessment & Plan Note (Signed)
Start avelox for the infection and a cough suppressant 

## 2011-07-25 ENCOUNTER — Other Ambulatory Visit: Payer: Self-pay | Admitting: Internal Medicine

## 2011-07-26 ENCOUNTER — Encounter: Payer: Self-pay | Admitting: Internal Medicine

## 2011-07-26 DIAGNOSIS — Z0001 Encounter for general adult medical examination with abnormal findings: Secondary | ICD-10-CM | POA: Insufficient documentation

## 2011-07-29 ENCOUNTER — Ambulatory Visit (INDEPENDENT_AMBULATORY_CARE_PROVIDER_SITE_OTHER): Payer: BC Managed Care – PPO | Admitting: Internal Medicine

## 2011-07-29 ENCOUNTER — Encounter: Payer: Self-pay | Admitting: Internal Medicine

## 2011-07-29 VITALS — BP 100/60 | HR 86 | Temp 97.8°F | Ht 61.0 in | Wt 148.4 lb

## 2011-07-29 DIAGNOSIS — M545 Low back pain: Secondary | ICD-10-CM | POA: Insufficient documentation

## 2011-07-29 DIAGNOSIS — M549 Dorsalgia, unspecified: Secondary | ICD-10-CM

## 2011-07-29 DIAGNOSIS — F411 Generalized anxiety disorder: Secondary | ICD-10-CM

## 2011-07-29 DIAGNOSIS — Z0000 Encounter for general adult medical examination without abnormal findings: Secondary | ICD-10-CM

## 2011-07-29 DIAGNOSIS — R05 Cough: Secondary | ICD-10-CM | POA: Insufficient documentation

## 2011-07-29 DIAGNOSIS — R059 Cough, unspecified: Secondary | ICD-10-CM

## 2011-07-29 DIAGNOSIS — I10 Essential (primary) hypertension: Secondary | ICD-10-CM

## 2011-07-29 MED ORDER — HYDROCODONE-HOMATROPINE 5-1.5 MG/5ML PO SYRP: 5.0000 mL | ORAL_SOLUTION | Freq: Four times a day (QID) | ORAL | Status: AC | PRN

## 2011-07-29 MED ORDER — NAPROXEN 500 MG PO TABS: 500.0000 mg | ORAL_TABLET | Freq: Two times a day (BID) | ORAL | Status: DC

## 2011-07-29 NOTE — Assessment & Plan Note (Signed)
stable overall by hx and exam, most recent data reviewed with pt, and pt to continue medical treatment as before  Lab Results  Component Value Date   WBC 9.7 05/03/2011   HGB 14.0 05/03/2011   HCT 41.4 05/03/2011   PLT 304.0 05/03/2011   GLUCOSE 94 01/07/2011   CHOL 189 04/06/2010   TRIG 339.0* 04/06/2010   HDL 73.70 04/06/2010   LDLDIRECT 137.0 04/06/2010   LDLCALC 45 01/25/2009   ALT 38* 01/07/2011   AST 30 01/07/2011   NA 138 01/07/2011   K 4.8 01/07/2011   CL 100 01/07/2011   CREATININE 0.9 01/07/2011   BUN 10 01/07/2011   CO2 28 01/07/2011   TSH 1.89 04/06/2010   INR 1.0 05/10/2008   HGBA1C 5.8 01/25/2009

## 2011-07-29 NOTE — Patient Instructions (Addendum)
Take all new medications as prescribed Continue all other medications as before You are given the copy of your chest xray and recent blood work OK to return to work Please return in 3 mo with Lab testing done 3-5 days before

## 2011-07-29 NOTE — Progress Notes (Signed)
Subjective:    [name], female    DOB: [birth year], 61 y.o.   MRN: [id number]  HPI  Here a wk early for f/u due to back pain, started overall x 1 wk, upper mid back between sholder blades,  Mild to mod, but worse to walk, and had to leave shopping yesteday due to pain increased with walking;  Dull pain, no radiation,  Lying down makes better or rest/not walk, but also has unusual problem of for some reason seems to be knocking things off dressers recently, new problem, - ? Perception issue, not a balance issue. Just returned to work last wk after off for 8 days due to flu/bronchitis, works as Print production planner, mostly sit down job, computer work - no issues with typing. Still has some cough post bronchitis, cxr recent neg for pna.  Pt denies fever, wt loss, night sweats, loss of appetite, or other constitutional symptoms  Overall good compliance with treatment, and good medicine tolerability.  Need cough med refill.   Past Medical History  Diagnosis Date  . ALLERGIC RHINITIS 01/24/2007  . [name] 11/17/2007  . ANXIETY 11/17/2007  . ASTHMA 07/19/2008  . DEPRESSION 01/24/2007  . HYPERLIPIDEMIA 01/26/2007  . COMMON MIGRAINE   . [name]   . DIVERTICULOSIS, COLON   . Diverticulitis   . Mild mitral regurgitation by prior echocardiogram   . Internal hemorrhoids    Past Surgical History  Procedure Date  . Appendectomy   . Abdominal hysterectomy     reports that she has quit smoking. She quit smokeless tobacco use about 14 years ago. She reports that she does not drink alcohol or use illicit drugs. family history includes Heart disease in her brother and father.  There is no history of Colon cancer. Allergies  Allergen Reactions  . Trazodone And Nefazodone    Current Outpatient Prescriptions on File Prior to Visit  Medication Sig Dispense Refill  . amitriptyline (ELAVIL) 50 MG tablet TAKE 1 TABLET AT BEDTIME AS NEEDED.  90 tablet  1  .  butalbital-aspirin-caffeine (FIORINAL) 50-325-40 MG per capsule Take 1 capsule by mouth daily as needed.  60 capsule  1  . fexofenadine (ALLEGRA) 180 MG tablet Take 1 tablet (180 mg total) by mouth daily as needed.  30 tablet  1  . simvastatin (ZOCOR) 80 MG tablet TAKE 1 TABLET ONCE DAILY.  90 tablet  3  . HYDROcodone-homatropine (HYCODAN) 5-1.5 MG/5ML syrup Take 5 mLs by mouth every 6 (six) hours as needed for cough.  120 mL  0  . moxifloxacin (AVELOX) 400 MG tablet Take 1 tablet (400 mg total) by mouth daily.  10 tablet  0   Review of Systems Review of Systems  Constitutional: Negative for diaphoresis and unexpected weight change.  HENT: Negative for drooling and tinnitus.   Eyes: Negative for photophobia and visual disturbance.  Respiratory: Negative for choking and stridor.   Gastrointestinal: Negative for vomiting and blood in stool.  Genitourinary: Negative for hematuria and decreased urine volume.     Objective:   Physical Exam BP 100/60  Pulse 86  Temp(Src) 97.8 F (36.6 C) (Oral)  Ht 5\' 1"  (1.549 m)  Wt 148 lb 6 oz (67.302 kg)  BMI 28.04 kg/m2  SpO2 97% Physical Exam  VS noted Constitutional: Pt appears well-developed and well-nourished. , mild obese HENT: Head: Normocephalic.  Right Ear: External ear normal.  Left Ear: External ear normal.  Eyes: Conjunctivae and EOM are normal. Pupils are equal, round,  and reactive to light.  Neck: Normal range of motion. Neck supple.  Cardiovascular: Normal rate and regular rhythm.   Pulmonary/Chest: Effort normal and breath sounds normal.  Back;  Mild tender just medial to right scapula without eryhema, swelling, rash Spine: nontender throughout Neurological: Pt is alert. No cranial nerve deficit. motor/sens/dtr intact, gait normal Skin: Skin is warm. No erythema.  Psychiatric: Pt behavior is normal. Thought content normal. 1+ nervous    Assessment & Plan:

## 2011-07-29 NOTE — Assessment & Plan Note (Signed)
stable overall by hx and exam, most recent data reviewed with pt, and pt to continue medical treatment as before  BP Readings from Last 3 Encounters:  07/29/11 100/60  07/18/11 108/78  01/24/11 110/60

## 2011-07-29 NOTE — Assessment & Plan Note (Signed)
Exam c/w MSK strain most likely such as rhomboid;  Spine nontender, pain nonradicular, no rash or other acute,  Ok for nsaid prn, ok to return to work,  to f/u any worsening symptoms or concerns

## 2011-07-29 NOTE — Assessment & Plan Note (Signed)
Exam benign, afeb, recent cxr reviewed with pt, no evidence for post nasal gtt or other acute,  Ok for cough med refill, likely related to recent bronchits resolving, no furhte antibx needed at this time

## 2011-08-19 ENCOUNTER — Other Ambulatory Visit: Payer: Self-pay | Admitting: Internal Medicine

## 2011-08-23 ENCOUNTER — Other Ambulatory Visit: Payer: Self-pay | Admitting: Internal Medicine

## 2011-08-26 ENCOUNTER — Other Ambulatory Visit: Payer: Self-pay | Admitting: Internal Medicine

## 2011-08-28 ENCOUNTER — Other Ambulatory Visit: Payer: Self-pay | Admitting: Internal Medicine

## 2011-08-30 ENCOUNTER — Other Ambulatory Visit: Payer: Self-pay | Admitting: Internal Medicine

## 2011-10-16 ENCOUNTER — Telehealth: Payer: Self-pay | Admitting: Internal Medicine

## 2011-10-16 NOTE — Telephone Encounter (Signed)
Caller: [name]/Patient; PCP: Oliver Barre; CB#: [phone]; Call regarding Diarrhea, Fever, intermittent Nausea, and Mid and Upper Back Pain;  Onset: 10/16/11. Temp 99.5 po at 1530.  Had 3 stools today; took Immodium.  LMP/hysterectomy. Advised to see MD within 24 hrs for pain intensifies with coughing, sneezing or straining per Back Symptoms Guideline.  Appt scheduled for 10/17/11 at 1100 with Dr Felicity Coyer at [place] office.

## 2011-10-17 ENCOUNTER — Ambulatory Visit (INDEPENDENT_AMBULATORY_CARE_PROVIDER_SITE_OTHER): Payer: BC Managed Care – PPO | Admitting: Internal Medicine

## 2011-10-17 ENCOUNTER — Other Ambulatory Visit (INDEPENDENT_AMBULATORY_CARE_PROVIDER_SITE_OTHER): Payer: BC Managed Care – PPO

## 2011-10-17 ENCOUNTER — Encounter: Payer: Self-pay | Admitting: *Deleted

## 2011-10-17 ENCOUNTER — Encounter: Payer: Self-pay | Admitting: Internal Medicine

## 2011-10-17 VITALS — BP 110/78 | HR 99 | Temp 97.7°F | Ht 62.0 in | Wt 142.8 lb

## 2011-10-17 DIAGNOSIS — M62830 Muscle spasm of back: Secondary | ICD-10-CM

## 2011-10-17 DIAGNOSIS — K5792 Diverticulitis of intestine, part unspecified, without perforation or abscess without bleeding: Secondary | ICD-10-CM

## 2011-10-17 DIAGNOSIS — K5732 Diverticulitis of large intestine without perforation or abscess without bleeding: Secondary | ICD-10-CM

## 2011-10-17 DIAGNOSIS — M549 Dorsalgia, unspecified: Secondary | ICD-10-CM

## 2011-10-17 DIAGNOSIS — M539 Dorsopathy, unspecified: Secondary | ICD-10-CM

## 2011-10-17 LAB — HEPATIC FUNCTION PANEL
ALT: 20 U/L (ref 0–35)
AST: 20 U/L (ref 0–37)
Albumin: 4.3 g/dL (ref 3.5–5.2)
Total Bilirubin: 0.6 mg/dL (ref 0.3–1.2)
Total Protein: 7.7 g/dL (ref 6.0–8.3)

## 2011-10-17 LAB — CBC WITH DIFFERENTIAL/PLATELET
Basophils Relative: 0.7 % (ref 0.0–3.0)
Eosinophils Relative: 0.8 % (ref 0.0–5.0)
Hemoglobin: 14.5 g/dL (ref 12.0–15.0)
Lymphocytes Relative: 17.5 % (ref 12.0–46.0)
Neutrophils Relative %: 75.4 % (ref 43.0–77.0)
RBC: 4.99 Mil/uL (ref 3.87–5.11)
WBC: 9.3 10*3/uL (ref 4.5–10.5)

## 2011-10-17 LAB — BASIC METABOLIC PANEL
BUN: 11 mg/dL (ref 6–23)
CO2: 24 mEq/L (ref 19–32)
Chloride: 99 mEq/L (ref 96–112)
Glucose, Bld: 113 mg/dL — ABNORMAL HIGH (ref 70–99)
Potassium: 4 mEq/L (ref 3.5–5.1)

## 2011-10-17 LAB — URINALYSIS, ROUTINE W REFLEX MICROSCOPIC
Hgb urine dipstick: NEGATIVE
Nitrite: NEGATIVE
Total Protein, Urine: NEGATIVE

## 2011-10-17 MED ORDER — CIPROFLOXACIN HCL 500 MG PO TABS: 500.0000 mg | ORAL_TABLET | Freq: Two times a day (BID) | ORAL | Status: AC

## 2011-10-17 MED ORDER — CYCLOBENZAPRINE HCL 5 MG PO TABS: 5.0000 mg | ORAL_TABLET | Freq: Three times a day (TID) | ORAL | Status: DC | PRN

## 2011-10-17 MED ORDER — METRONIDAZOLE 500 MG PO TABS: 500.0000 mg | ORAL_TABLET | Freq: Three times a day (TID) | ORAL | Status: AC

## 2011-10-17 NOTE — Progress Notes (Signed)
  Subjective:    [name], female    DOB: August 22, 1950, 61 y.o.   MRN: [id number]  HPI  complains of ache - neck, back and abdominal pain associated with diarrhea, nausea and fever yesterday - all improved today hx prior diverticulitis fall 2012 with prior symptoms   Past Medical History  Diagnosis Date  . ALLERGIC RHINITIS   . [name]   . ANXIETY   . ASTHMA   . DEPRESSION   . HYPERLIPIDEMIA   . COMMON MIGRAINE   . [name]   . DIVERTICULOSIS, COLON   . Mild mitral regurgitation by prior echocardiogram     Review of Systems  Constitutional: Positive for appetite change and fatigue.  Gastrointestinal: Positive for abdominal pain. Negative for abdominal distention.  Musculoskeletal: Negative for myalgias and arthralgias.       Objective:   Physical Exam BP 110/78  Pulse 99  Temp(Src) 97.7 F (36.5 C) (Oral)  Ht 5\' 2"  (1.575 m)  Wt 142 lb 12.8 oz (64.774 kg)  BMI 26.12 kg/m2  SpO2 95% Constitutional: She appears well-developed and well-nourished. No distress. Neck: Normal range of motion. Neck supple. No JVD present. No thyromegaly present.  Cardiovascular: Normal rate, regular rhythm and normal heart sounds.  No murmur heard. No BLE edema. Pulmonary/Chest: Effort normal and breath sounds normal. No respiratory distress. She has no wheezes.  Abdominal: Soft. Bowel sounds are normal. She exhibits no distension. There is no tenderness. no masses Musculoskeletal: Back: full range of motion of thoracic and lumbar spine. Vertebra non tender to direct palpation. Negative straight leg raise. DTR's are symmetrically intact. Sensation intact in all dermatomes of the lower extremities. Full strength to manual muscle testing. patient is able to heel toe walk without difficulty and ambulates with antalgic gait. Psychiatric: She has an anxious mood and affect. Her behavior is normal. Judgment and thought content normal.   Lab Results    Component Value Date   WBC 9.7 05/03/2011   HGB 14.0 05/03/2011   HCT 41.4 05/03/2011   PLT 304.0 05/03/2011   GLUCOSE 94 01/07/2011   CHOL 189 04/06/2010   TRIG 339.0* 04/06/2010   HDL 73.70 04/06/2010   LDLDIRECT 137.0 04/06/2010   LDLCALC 45 01/25/2009   ALT 38* 01/07/2011   AST 30 01/07/2011   NA 138 01/07/2011   K 4.8 01/07/2011   CL 100 01/07/2011   CREATININE 0.9 01/07/2011   BUN 10 01/07/2011   CO2 28 01/07/2011   TSH 1.89 04/06/2010   INR 1.0 05/10/2008   HGBA1C 5.8 01/25/2009      Assessment & Plan:  Diverticulitis - suspect cause of vague abdominal pain, back pain, diarrhea and LGF  Check labs Cipro/Flagyl, naprosyn as needed, diet (info given) Also add  Flexeril for muscle spams and pain (chronic in back and neck) Work excuse given for yesterday and today

## 2011-10-17 NOTE — Telephone Encounter (Signed)
Noted - i will eval pt for same at [facility] today

## 2011-10-17 NOTE — Patient Instructions (Signed)
It was good to see you today. Test(s) ordered today. Your results will be called to you after review (48-72hours after test completion). If any changes need to be made, you will be notified at that time. Cipro+Flagyl antibiotics x 1 week, naprosyn as needed for pain/fever, also Flexeril as needed for neck/back pain and spasm Your prescription(s) have been submitted to your pharmacy. Please take as directed and contact our office if you believe you are having problem(s) with the medication(s). Work excuse note Diverticulitis Small pockets or "bubbles" can develop in the wall of the intestine. Diverticulitis is when those pockets become infected and inflamed. This causes stomach pain (usually on the left side). HOME CARE  Take all medicine as told by your doctor.   Try a clear liquid diet (broth, tea, or water) for as long as told by your doctor.   Keep all follow-up visits with your doctor.   You may be put on a low-fiber diet once you start feeling better. Here are foods that have low-fiber:   White breads, cereals, rice, and pasta.   Cooked fruits and vegetables or soft fresh fruits and vegetables without the skin.   Ground or well-cooked tender beef, ham, veal, lamb, pork, or poultry.   Eggs and seafood.   After you are doing well on the low-fiber diet, you may be put on a high-fiber diet. Here are ways to increase your fiber:   Choose whole-grain breads, cereals, pasta, and brown rice.   Choose fruits and vegetables with skin on. Do not overcook the vegetables.   Choose nuts, seeds, legumes, dried peas, beans, and lentils.   Look for food products that have more than 3 grams of fiber per serving on the food label.  GET HELP RIGHT AWAY IF:  Your pain does not get better or gets worse.   You have trouble eating food.   You are not pooping (having bowel movements) like normal.   You have a temperature by mouth above 102 F (38.9 C), not controlled by medicine.   You keep  throwing up (vomiting).   You have bloody or black, tarry poop (stools).   You are getting worse and not better.  MAKE SURE YOU:    Understand these instructions.   Will watch your condition.   Will get help right away if you are not doing well or get worse.  Document Released: 10/30/2007 Document Revised: 05/02/2011 Document Reviewed: 04/03/2009 [facility] Patient Information 2012 [place], [place].

## 2011-10-18 ENCOUNTER — Telehealth: Payer: Self-pay

## 2011-10-18 NOTE — Telephone Encounter (Signed)
Message copied by Maurice Small on Fri Oct 18, 2011  9:13 AM ------      Message from: Rene Paci A      Created: Fri Oct 18, 2011  9:03 AM       Please call patient: Results ok. No treatment changes recommended. Thanks

## 2011-10-18 NOTE — Telephone Encounter (Signed)
I called patient (home number) , mailbox was full and I was unable to leave message. I will try to reach patient on an alternate number    I called patient on cell phone, patient verbalized understanding of lab results and was without question at the time of call.

## 2011-10-29 ENCOUNTER — Ambulatory Visit: Payer: BC Managed Care – PPO | Admitting: Internal Medicine

## 2011-11-13 ENCOUNTER — Other Ambulatory Visit (HOSPITAL_COMMUNITY): Payer: Self-pay | Admitting: Gynecology

## 2011-11-13 DIAGNOSIS — Z1231 Encounter for screening mammogram for malignant neoplasm of breast: Secondary | ICD-10-CM

## 2011-12-05 ENCOUNTER — Ambulatory Visit (HOSPITAL_COMMUNITY)
Admission: RE | Admit: 2011-12-05 | Discharge: 2011-12-05 | Disposition: A | Discharge status: Home / Self Care | Payer: BC Managed Care – PPO | Source: Ambulatory Visit | Attending: Gynecology | Admitting: Gynecology

## 2011-12-05 DIAGNOSIS — Z1231 Encounter for screening mammogram for malignant neoplasm of breast: Secondary | ICD-10-CM | POA: Insufficient documentation

## 2011-12-25 ENCOUNTER — Other Ambulatory Visit: Payer: Self-pay

## 2011-12-25 MED ORDER — BUTALBITAL-ASPIRIN-CAFFEINE 50-325-40 MG PO CAPS: 1.0000 | ORAL_CAPSULE | Freq: Every day | ORAL | Status: DC | PRN

## 2012-01-14 ENCOUNTER — Other Ambulatory Visit: Payer: Self-pay

## 2012-01-14 MED ORDER — AMITRIPTYLINE HCL 50 MG PO TABS: 50.0000 mg | ORAL_TABLET | Freq: Every evening | ORAL | Status: DC | PRN

## 2012-01-14 NOTE — Telephone Encounter (Signed)
Done erx 

## 2012-01-29 ENCOUNTER — Other Ambulatory Visit: Payer: Self-pay | Admitting: Internal Medicine

## 2012-02-26 ENCOUNTER — Encounter: Payer: Self-pay | Admitting: Internal Medicine

## 2012-02-26 ENCOUNTER — Ambulatory Visit (INDEPENDENT_AMBULATORY_CARE_PROVIDER_SITE_OTHER): Payer: BC Managed Care – PPO | Admitting: Internal Medicine

## 2012-02-26 ENCOUNTER — Other Ambulatory Visit (INDEPENDENT_AMBULATORY_CARE_PROVIDER_SITE_OTHER): Payer: BC Managed Care – PPO

## 2012-02-26 VITALS — BP 118/78 | HR 86 | Temp 97.3°F | Ht 61.0 in | Wt 145.5 lb

## 2012-02-26 DIAGNOSIS — I10 Essential (primary) hypertension: Secondary | ICD-10-CM

## 2012-02-26 DIAGNOSIS — R10819 Abdominal tenderness, unspecified site: Secondary | ICD-10-CM

## 2012-02-26 DIAGNOSIS — F329 Major depressive disorder, single episode, unspecified: Secondary | ICD-10-CM

## 2012-02-26 LAB — URINALYSIS, ROUTINE W REFLEX MICROSCOPIC
Hgb urine dipstick: NEGATIVE
Nitrite: NEGATIVE
Specific Gravity, Urine: 1.025 (ref 1.000–1.030)
Total Protein, Urine: NEGATIVE
Urine Glucose: NEGATIVE
pH: 6 (ref 5.0–8.0)

## 2012-02-26 LAB — CBC WITH DIFFERENTIAL/PLATELET
Basophils Relative: 0.2 % (ref 0.0–3.0)
Eosinophils Relative: 2.3 % (ref 0.0–5.0)
MCV: 89.5 fl (ref 78.0–100.0)
Monocytes Absolute: 0.6 10*3/uL (ref 0.1–1.0)
Monocytes Relative: 6.5 % (ref 3.0–12.0)
Neutrophils Relative %: 58 % (ref 43.0–77.0)
Platelets: 297 10*3/uL (ref 150.0–400.0)
RBC: 4.51 Mil/uL (ref 3.87–5.11)
WBC: 8.5 10*3/uL (ref 4.5–10.5)

## 2012-02-26 MED ORDER — CIPROFLOXACIN HCL 500 MG PO TABS: 500.0000 mg | ORAL_TABLET | Freq: Two times a day (BID) | ORAL | Status: DC

## 2012-02-26 MED ORDER — METRONIDAZOLE 250 MG PO TABS: 250.0000 mg | ORAL_TABLET | Freq: Three times a day (TID) | ORAL | Status: DC

## 2012-02-26 NOTE — Patient Instructions (Addendum)
Take all new medications as prescribed Continue all other medications as before Please go to LAB in the Basement for the blood and/or urine tests to be done today You will be contacted by phone if any changes need to be made immediately.  Otherwise, you will receive a letter about your results with an explanation. Please remember to sign up for My Chart at your earliest convenience, as this will be important to you in the future with finding out test results.  

## 2012-02-26 NOTE — Assessment & Plan Note (Addendum)
Hx of diverticulitis x 2, pt with exam could be conssistent, mild;  Will check UA but tx empirically for recurrent diverticulitis, hold on imaging at this time and follow clinically, consider GI for any persistent or worsening s/s

## 2012-02-27 ENCOUNTER — Encounter: Payer: Self-pay | Admitting: Internal Medicine

## 2012-02-27 LAB — HEPATIC FUNCTION PANEL
AST: 25 U/L (ref 0–37)
Albumin: 3.9 g/dL (ref 3.5–5.2)
Alkaline Phosphatase: 83 U/L (ref 39–117)
Total Bilirubin: 0.3 mg/dL (ref 0.3–1.2)

## 2012-02-27 LAB — BASIC METABOLIC PANEL
Calcium: 9.2 mg/dL (ref 8.4–10.5)
GFR: 74.41 mL/min (ref >60.00)
Glucose, Bld: 109 mg/dL — ABNORMAL HIGH (ref 70–99)
Potassium: 4 mEq/L (ref 3.5–5.1)
Sodium: 142 mEq/L (ref 135–145)

## 2012-02-29 ENCOUNTER — Encounter: Payer: Self-pay | Admitting: Internal Medicine

## 2012-02-29 NOTE — Progress Notes (Signed)
Subjective:    [name], female    DOB: 1951-05-19, 61 y.o.   MRN: [id number]  HPI  Here to f/u with acute onset 2-3 days nausea with vomit total 5 times without blood, but with ? Acid feeling in throat, some difficulty swallowing solid during this time, ok with liquid and soft food, feeling feverish and warm, sleepy such that she take unusual nap during the day, unsuual fatigue, feeling ill and lower abd discomfort; Denies worsening bowel change or blood such as constipation or diarrhea.  No recent nsaid use,  Denies urinary symptoms such as dysuria, frequency, urgency,or hematuria.  Works at [employer] and may have been exposed to Chief of Staff at any time.  Had flu shot at work. Has hx of diverticulosis,, s/p appy, TAH. Last colonoscopy 2008. Denies worsening depressive symptoms, suicidal ideation, or panic Past Medical History  Diagnosis Date  . ALLERGIC RHINITIS   . [name]   . ANXIETY   . ASTHMA   . DEPRESSION   . HYPERLIPIDEMIA   . COMMON MIGRAINE   . [name]   . DIVERTICULOSIS, COLON   . Mild mitral regurgitation by prior echocardiogram    Past Surgical History  Procedure Date  . Appendectomy   . Abdominal hysterectomy     reports that she has quit smoking. She quit smokeless tobacco use about 14 years ago. She reports that she does not drink alcohol or use illicit drugs. family history includes Heart disease in her brother and father.  There is no history of Colon cancer. Allergies  Allergen Reactions  . Trazodone And Nefazodone    Current Outpatient Prescriptions on File Prior to Visit  Medication Sig Dispense Refill  . amitriptyline (ELAVIL) 50 MG tablet Take 1 tablet (50 mg total) by mouth at bedtime as needed for sleep.  90 tablet  1  . butalbital-aspirin-caffeine (FIORINAL) 50-325-40 MG per capsule Take 1 capsule by mouth daily as needed.  60 capsule  1  . butalbital-aspirin-caffeine (FIORINAL) 50-325-40 MG per capsule Take  1 capsule by mouth daily as needed.  60 capsule  1  . fexofenadine (ALLEGRA) 180 MG tablet Take 1 tablet (180 mg total) by mouth daily as needed.  30 tablet  1  . simvastatin (ZOCOR) 80 MG tablet TAKE 1 TABLET ONCE DAILY.  90 tablet  2   Review of Systems  Constitutional: Negative for diaphoresis and unexpected weight change.  HENT: Negative for tinnitus.   Eyes: Negative for photophobia and visual disturbance.  Respiratory: Negative for choking and stridor.   Gastrointestinal: Negative for vomiting and blood in stool.  Genitourinary: Negative for hematuria and decreased urine volume.  Musculoskeletal: Negative for gait problem.  Skin: Negative for color change and wound.  Neurological: Negative for tremors and numbness.  Psychiatric/Behavioral: Negative for decreased concentration. The patient is not hyperactive.       Objective:   Physical Exam BP 118/78  Pulse 86  Temp 97.3 F (36.3 C) (Oral)  Ht 5\' 1"  (1.549 m)  Wt 145 lb 8 oz (65.998 kg)  BMI 27.49 kg/m2  SpO2 94% Physical Exam  VS noted, mild ill appaering Constitutional: Pt appears well-developed and well-nourished.  HENT: Head: Normocephalic.  Right Ear: External ear normal.  Left Ear: External ear normal.  Eyes: Conjunctivae and EOM are normal. Pupils are equal, round, and reactive to light.  Neck: Normal range of motion. Neck supple.  Cardiovascular: Normal rate and regular rhythm.   Pulmonary/Chest: Effort normal and breath sounds  normal.  Abd:  Soft,  non-distended, + BS but tender bilat lower abd, right > left, no guarding or rebound Neurological: Pt is alert. Not confused  Skin: Skin is warm. No erythema.  Psychiatric: Pt behavior is normal. Thought content normal. not depressed affect, mild nervous    Assessment & Plan:

## 2012-02-29 NOTE — Assessment & Plan Note (Signed)
stable overall by hx and exam, most recent data reviewed with pt, and pt to continue medical treatment as before BP Readings from Last 3 Encounters:  02/26/12 118/78  10/17/11 110/78  07/29/11 100/60

## 2012-02-29 NOTE — Assessment & Plan Note (Signed)
stable overall by hx and exam, most recent data reviewed with pt, and pt to continue medical treatment as before Lab Results  Component Value Date   WBC 8.5 02/26/2012   HGB 13.4 02/26/2012   HCT 40.4 02/26/2012   PLT 297.0 02/26/2012   GLUCOSE 109* 02/26/2012   CHOL 189 04/06/2010   TRIG 339.0* 04/06/2010   HDL 73.70 04/06/2010   LDLDIRECT 137.0 04/06/2010   LDLCALC 45 01/25/2009   ALT 26 02/26/2012   AST 25 02/26/2012   NA 142 02/26/2012   K 4.0 02/26/2012   CL 110 02/26/2012   CREATININE 0.8 02/26/2012   BUN 14 02/26/2012   CO2 28 02/26/2012   TSH 1.89 04/06/2010   INR 1.0 05/10/2008   HGBA1C 5.8 01/25/2009

## 2012-03-02 ENCOUNTER — Telehealth: Payer: Self-pay

## 2012-03-02 NOTE — Telephone Encounter (Signed)
Completed letter and fax to number per pt. request

## 2012-03-02 NOTE — Telephone Encounter (Signed)
The patient called to inform she did not get her work note from 02/26/12 appt.  She would like one to be faxed to her number at [phone], to be excused from work 02/26/12 through Friday 02/28/12.

## 2012-03-02 NOTE — Telephone Encounter (Signed)
Ok - to robin to handle 

## 2012-04-16 ENCOUNTER — Ambulatory Visit (INDEPENDENT_AMBULATORY_CARE_PROVIDER_SITE_OTHER)
Admission: RE | Admit: 2012-04-16 | Discharge: 2012-04-16 | Disposition: A | Discharge status: Home / Self Care | Payer: BC Managed Care – PPO | Source: Ambulatory Visit | Attending: Internal Medicine | Admitting: Internal Medicine

## 2012-04-16 ENCOUNTER — Ambulatory Visit (INDEPENDENT_AMBULATORY_CARE_PROVIDER_SITE_OTHER): Payer: BC Managed Care – PPO | Admitting: Internal Medicine

## 2012-04-16 ENCOUNTER — Encounter: Payer: Self-pay | Admitting: Internal Medicine

## 2012-04-16 VITALS — BP 100/70 | HR 80 | Temp 97.2°F | Ht 61.0 in | Wt 147.1 lb

## 2012-04-16 DIAGNOSIS — Z0000 Encounter for general adult medical examination without abnormal findings: Secondary | ICD-10-CM

## 2012-04-16 DIAGNOSIS — Z2911 Encounter for prophylactic immunotherapy for respiratory syncytial virus (RSV): Secondary | ICD-10-CM

## 2012-04-16 DIAGNOSIS — R079 Chest pain, unspecified: Secondary | ICD-10-CM

## 2012-04-16 DIAGNOSIS — R0789 Other chest pain: Secondary | ICD-10-CM

## 2012-04-16 DIAGNOSIS — Z23 Encounter for immunization: Secondary | ICD-10-CM

## 2012-04-16 MED ORDER — BUTALBITAL-ASPIRIN-CAFFEINE 50-325-40 MG PO CAPS: 1.0000 | ORAL_CAPSULE | Freq: Every day | ORAL | Status: DC | PRN

## 2012-04-16 MED ORDER — AMITRIPTYLINE HCL 50 MG PO TABS: 50.0000 mg | ORAL_TABLET | Freq: Every evening | ORAL | Status: DC | PRN

## 2012-04-16 MED ORDER — SIMVASTATIN 80 MG PO TABS: 80.0000 mg | ORAL_TABLET | Freq: Every day | ORAL | Status: DC

## 2012-04-16 NOTE — Assessment & Plan Note (Signed)

## 2012-04-16 NOTE — Assessment & Plan Note (Signed)
Atypical, ECG reviewed as per emr, ? esoph spasm vs msk, did have some reflux as well a few wks ago, but with brother with CAD/MI and pt very concerned - asks for card evaluation

## 2012-04-16 NOTE — Progress Notes (Signed)
Subjective:    [name], female    DOB: 02/09/1951, 61 y.o.   MRN: [id number]  HPI  Here for wellness and f/u;  Overall doing ok;  Pt denies CP, worsening SOB, DOE, wheezing, orthopnea, PND, worsening LE edema, palpitations, dizziness or syncope except for chest mid upper tightness/pressure like with radiation to the back, mild, intermittent, for 1 wk, assoc with unprovoked palpiations occas daily that is unusual for her, no radiation o/w, no n/v, diaphoresis, dizzy.  Still going up 2 flights stairs at work as per usual in the past wk without difficulty.  Excercises daily - 20-30 min at gym (ellipitcal, some light wts) and daily walks dog 1 mile unless it rains.    Pt denies neurological change such as new Headache, facial or extremity weakness.  Pt denies polydipsia, polyuria, or low sugar symptoms. Pt states overall good compliance with treatment and medications, good tolerability, and trying to follow lower cholesterol diet.  Pt denies worsening depressive symptoms, suicidal ideation or panic. No fever, wt loss, night sweats, loss of appetite, or other constitutional symptoms.  Pt states good ability with ADL's, low fall risk, home safety reviewed and adequate, no significant changes in hearing or vision, and occasionally active with exercise. Does have sense of ongoing fatigue, but denies signficant hypersomnolence.   Past Medical History  Diagnosis Date  . ALLERGIC RHINITIS   . [name]   . ANXIETY   . ASTHMA   . DEPRESSION   . HYPERLIPIDEMIA   . COMMON MIGRAINE   . [name]   . DIVERTICULOSIS, COLON   . Mild mitral regurgitation by prior echocardiogram    Past Surgical History  Procedure Date  . Appendectomy   . Abdominal hysterectomy     reports that she has quit smoking. She quit smokeless tobacco use about 14 years ago. She reports that she does not drink alcohol or use illicit drugs. family history includes Heart disease in her  brother and father.  There is no history of Colon cancer. Allergies  Allergen Reactions  . Trazodone And Nefazodone    Current Outpatient Prescriptions on File Prior to Visit  Medication Sig Dispense Refill  . fexofenadine (ALLEGRA) 180 MG tablet Take 1 tablet (180 mg total) by mouth daily as needed.  30 tablet  1  . [DISCONTINUED] amitriptyline (ELAVIL) 50 MG tablet Take 1 tablet (50 mg total) by mouth at bedtime as needed for sleep.  90 tablet  1  . [DISCONTINUED] butalbital-aspirin-caffeine (FIORINAL) 50-325-40 MG per capsule Take 1 capsule by mouth daily as needed.  60 capsule  1  . [DISCONTINUED] butalbital-aspirin-caffeine (FIORINAL) 50-325-40 MG per capsule Take 1 capsule by mouth daily as needed.  60 capsule  1  . [DISCONTINUED] simvastatin (ZOCOR) 80 MG tablet TAKE 1 TABLET ONCE DAILY.  90 tablet  2   Review of Systems Review of Systems  Constitutional: Negative for diaphoresis, activity change, appetite change and unexpected weight change.  HENT: Negative for hearing loss, ear pain, facial swelling, mouth sores and neck stiffness.   Eyes: Negative for pain, redness and visual disturbance.  Respiratory: Negative for shortness of breath and wheezing.   Gastrointestinal: Negative for diarrhea, blood in stool, abdominal distention and rectal pain.  Genitourinary: Negative for hematuria, flank pain and decreased urine volume.  Musculoskeletal: Negative for myalgias and joint swelling.  Skin: Negative for color change and wound.  Neurological: Negative for syncope and numbness.  Hematological: Negative for adenopathy.  Psychiatric/Behavioral: Negative for hallucinations,  self-injury, decreased concentration and agitation.      Objective:   Physical Exam BP 100/70  Pulse 80  Temp 97.2 F (36.2 C) (Oral)  Ht 5\' 1"  (1.549 m)  Wt 147 lb 2 oz (66.735 kg)  BMI 27.80 kg/m2  SpO2 96% Physical Exam  VS noted Constitutional: Pt is oriented to person, place, and time. Appears  well-developed and well-nourished.  HENT:  Head: Normocephalic and atraumatic.  Right Ear: External ear normal.  Left Ear: External ear normal.  Nose: Nose normal.  Mouth/Throat: Oropharynx is clear and moist.  Eyes: Conjunctivae and EOM are normal. Pupils are equal, round, and reactive to light.  Neck: Normal range of motion. Neck supple. No JVD present. No tracheal deviation present.  Cardiovascular: Normal rate, regular rhythm, normal heart sounds and intact distal pulses.   Pulmonary/Chest: Effort normal and breath sounds normal.  Abdominal: Soft. Bowel sounds are normal. There is no tenderness.  Musculoskeletal: Normal range of motion. Exhibits no edema.  Lymphadenopathy:  Has no cervical adenopathy.  Neurological: Pt is alert and oriented to person, place, and time. Pt has normal reflexes. No cranial nerve deficit.  Skin: Skin is warm and dry. No rash noted.  Psychiatric:  Has  normal mood and affect. Behavior is normal. 1+ nervous    Assessment & Plan:

## 2012-04-16 NOTE — Patient Instructions (Addendum)
Continue all other medications as before Please have the pharmacy call with any other refills you may need. Please remember to followup with your GYN for the yearly pap smear and/or mammogram You had the shingles shot today Please go to XRAY in the Basement for the x-ray test You will be contacted by phone if any changes need to be made immediately.  Otherwise, you will receive a letter about your results with an explanation, but please check with MyChart first. Thank you for enrolling in MyChart. Please follow the instructions below to securely access your online medical record. MyChart allows you to send messages to your doctor, view your test results, renew your prescriptions, schedule appointments, and more. To Log into MyChart, please go to [email or web address], and your Username is:  [email or web address] Please return in 1 year for your yearly visit, or sooner if needed, with Lab testing done 3-5 days before

## 2012-04-20 ENCOUNTER — Encounter: Payer: Self-pay | Admitting: Cardiovascular Disease

## 2012-04-20 ENCOUNTER — Ambulatory Visit (INDEPENDENT_AMBULATORY_CARE_PROVIDER_SITE_OTHER): Payer: BC Managed Care – PPO | Admitting: Cardiovascular Disease

## 2012-04-20 VITALS — BP 120/84 | HR 77 | Ht 61.0 in | Wt 149.0 lb

## 2012-04-20 DIAGNOSIS — R079 Chest pain, unspecified: Secondary | ICD-10-CM

## 2012-04-20 DIAGNOSIS — R002 Palpitations: Secondary | ICD-10-CM

## 2012-04-20 DIAGNOSIS — E785 Hyperlipidemia, unspecified: Secondary | ICD-10-CM

## 2012-04-20 MED ORDER — SIMVASTATIN 40 MG PO TABS: 40.0000 mg | ORAL_TABLET | Freq: Every day | ORAL | Status: DC

## 2012-04-20 NOTE — Assessment & Plan Note (Signed)
[name] with some episodes of chest pressure. These occur at various times. They are not related to eating, drinking, change of position, or exercise. She exercises on regular basis and has never brought on when these episodes of chest discomfort.  She has a strong family history of coronary artery disease.  Because the symptoms seem to radiate to her shoulder we will order a stress echocardiogram for further evaluation and to rule out coronary artery disease.

## 2012-04-20 NOTE — Assessment & Plan Note (Signed)
She is on simvastatin 80. We typically not giving simvastatin in the high dose. We will cut her dose to 40 mg a day. Her last lipid level will.[name]. was in 2009 which was very acceptable. We'll check fasting lipids and in in 3 months we will also check hepatic profile and basic metabolic profile.

## 2012-04-20 NOTE — Patient Instructions (Addendum)
Your physician recommends that you schedule a follow-up appointment in: 3 months   Your physician has recommended that you wear an event monitor. Event monitors are medical devices that record the heart's electrical activity. Doctors most often [place] these monitors to diagnose arrhythmias. Arrhythmias are problems with the speed or rhythm of the heartbeat. The monitor is a small, portable device. You can wear one while you do your normal daily activities. This is usually used to diagnose what is causing palpitations/syncope (passing out).  Your physician has requested that you have a stress echocardiogram.  Please follow instruction sheet as given.  Your physician recommends that you return for a FASTING lipid profile: 3 months bmet lipid hepatic  Your physician has recommended you make the following change in your medication: decrease simvastatin to 40 mg from 80 mg

## 2012-04-20 NOTE — Progress Notes (Signed)
Kinue Gudeman Helin Date of Birth  12/10/1950       [facility]    [place] [place] [address]  [address] [place], [place]  [address]   [place], [place]  [address] [phone]     [phone]   Fax  [phone]    Fax [phone]  Problem List: 1. Chest pressure 2. Hyperlipidemia 3. Mild MR  History of Present Illness:  Maudene is a 61 yo who presents for further evaluation of episodes of chest pressure and also palpitations.  The chest pressure sensation occurs spontaneously- not associated with exercise, mental stress, eating, or drinking. It seems to last for several minutes and then resolve spontaneously.  It is not associated with indigestion or .  The pain seems to radiate to her right shoulder and intrascapular region.  She also has new palpitation.  She woke up with a fast HR this am. She denies any syncope or presyncope. These episodes of palpitations do not seem to be associated with the before mentioned chest pressure.  She exercises every day.  She walks her dog every day.   She seems to have a decreased appetite over the past 2 weeks.  Current Outpatient Prescriptions on File Prior to Visit  Medication Sig Dispense Refill  . amitriptyline (ELAVIL) 50 MG tablet Take 1 tablet (50 mg total) by mouth at bedtime as needed for sleep.  90 tablet  1  . butalbital-aspirin-caffeine (FIORINAL) 50-325-40 MG per capsule Take 1 capsule by mouth daily as needed.  60 capsule  1  . fexofenadine (ALLEGRA) 180 MG tablet Take 1 tablet (180 mg total) by mouth daily as needed.  30 tablet  1  . simvastatin (ZOCOR) 80 MG tablet Take 1 tablet (80 mg total) by mouth daily.  90 tablet  3    Allergies  Allergen Reactions  . Trazodone And Nefazodone     Past Medical History  Diagnosis Date  . ALLERGIC RHINITIS   . [name]   . ANXIETY   . ASTHMA   . DEPRESSION   . HYPERLIPIDEMIA   . COMMON MIGRAINE   . [name]    . DIVERTICULOSIS, COLON   . Mild mitral regurgitation by prior echocardiogram     Past Surgical History  Procedure Date  . Appendectomy   . Abdominal hysterectomy     History  Smoking status  . Former Smoker  Smokeless tobacco  . Former Neurosurgeon  . Quit date: 05/27/1997    History  Alcohol Use No    Comment: occasional     Family History  Problem Relation Age of Onset  . Heart disease Father   . Heart disease Brother   . Colon cancer Neg Hx     Reviw of Systems:  Reviewed in the HPI.  All other systems are negative.  Physical Exam: Blood pressure 120/84, pulse 77, height 5\' 1"  (1.549 m), weight 149 lb (67.586 kg), SpO2 99.00%. General: Well developed, well nourished, in no acute distress.  Head: Normocephalic, atraumatic, sclera non-icteric, mucus membranes are moist,   Neck: Supple. Carotids are 2 + without bruits. No JVD   Lungs: Clear   Heart: RR, normal S1, S2  Abdomen: Soft, non-tender, non-distended with normal bowel sounds.  Msk:  Strength and tone are normal  Extremities: No clubbing or cyanosis. No edema.  Distal pedal pulses are 2+ and equal    Neuro: CN II - XII intact.  Alert and oriented X 3.  Psych:  unremarkable  ECG: Nov, 21, 2013 , NSR at 76.  No ST or T wave changes.  Assessment / Plan:

## 2012-05-06 ENCOUNTER — Ambulatory Visit (HOSPITAL_COMMUNITY): Payer: BC Managed Care – PPO | Attending: Cardiovascular Disease

## 2012-05-06 ENCOUNTER — Encounter (INDEPENDENT_AMBULATORY_CARE_PROVIDER_SITE_OTHER): Payer: BC Managed Care – PPO

## 2012-05-06 ENCOUNTER — Telehealth: Payer: Self-pay | Admitting: *Deleted

## 2012-05-06 DIAGNOSIS — R0989 Other specified symptoms and signs involving the circulatory and respiratory systems: Secondary | ICD-10-CM

## 2012-05-06 DIAGNOSIS — R079 Chest pain, unspecified: Secondary | ICD-10-CM

## 2012-05-06 DIAGNOSIS — R002 Palpitations: Secondary | ICD-10-CM

## 2012-05-06 DIAGNOSIS — R000 Tachycardia, unspecified: Secondary | ICD-10-CM | POA: Insufficient documentation

## 2012-05-06 DIAGNOSIS — R072 Precordial pain: Secondary | ICD-10-CM | POA: Insufficient documentation

## 2012-05-06 DIAGNOSIS — J45909 Unspecified asthma, uncomplicated: Secondary | ICD-10-CM | POA: Insufficient documentation

## 2012-05-06 DIAGNOSIS — Z8249 Family history of ischemic heart disease and other diseases of the circulatory system: Secondary | ICD-10-CM | POA: Insufficient documentation

## 2012-05-06 NOTE — Telephone Encounter (Signed)
E-cardio event monitor enrolled 05/06/12-06/05/12. TK

## 2012-05-06 NOTE — Progress Notes (Signed)
Echocardiogram performed.  

## 2012-05-11 ENCOUNTER — Other Ambulatory Visit: Payer: Self-pay | Admitting: Cardiovascular Disease

## 2012-05-11 MED ORDER — SIMVASTATIN 40 MG PO TABS: 40.0000 mg | ORAL_TABLET | Freq: Every day | ORAL | Status: DC

## 2012-06-02 ENCOUNTER — Telehealth: Payer: Self-pay

## 2012-06-02 NOTE — Telephone Encounter (Signed)
Called left message to call back 

## 2012-06-02 NOTE — Telephone Encounter (Signed)
I would respectfully ask pt to re-direct question to the prescribing MD

## 2012-06-02 NOTE — Telephone Encounter (Signed)
The patient was given by her OBGYN nitrofurantoin 100 mg bid for 7 days and uribell #8 for 2 days.  She started both meds on Saturday.  She is feeling better, still having some back pain and fatigue.  Advise please

## 2012-06-03 NOTE — Telephone Encounter (Signed)
Called the patient vm not set up yet

## 2012-06-04 NOTE — Telephone Encounter (Signed)
Unable to contact the patient by phone. 

## 2012-06-04 NOTE — Telephone Encounter (Signed)
Called the patient unable to leave a message.

## 2012-06-12 ENCOUNTER — Ambulatory Visit (INDEPENDENT_AMBULATORY_CARE_PROVIDER_SITE_OTHER): Payer: BC Managed Care – PPO | Admitting: *Deleted

## 2012-06-12 DIAGNOSIS — Z23 Encounter for immunization: Secondary | ICD-10-CM

## 2012-06-15 ENCOUNTER — Telehealth: Payer: Self-pay | Admitting: *Deleted

## 2012-06-15 NOTE — Telephone Encounter (Signed)
Attempt to contact patient to give results of Event Monitor. Voicemail box has not been set up. Will continue to try.

## 2012-06-15 NOTE — Telephone Encounter (Signed)
Attempted to call pt, no mail box, and work number could only leave a msg, will continue to try.

## 2012-06-15 NOTE — Telephone Encounter (Signed)
Pt called with results, verbalized understanding.

## 2012-06-25 ENCOUNTER — Other Ambulatory Visit (INDEPENDENT_AMBULATORY_CARE_PROVIDER_SITE_OTHER): Payer: BC Managed Care – PPO

## 2012-06-25 ENCOUNTER — Telehealth: Payer: Self-pay | Admitting: Internal Medicine

## 2012-06-25 ENCOUNTER — Encounter: Payer: Self-pay | Admitting: Internal Medicine

## 2012-06-25 ENCOUNTER — Ambulatory Visit (INDEPENDENT_AMBULATORY_CARE_PROVIDER_SITE_OTHER): Payer: BC Managed Care – PPO | Admitting: Internal Medicine

## 2012-06-25 VITALS — BP 110/82 | HR 85 | Temp 97.5°F | Ht 61.0 in | Wt 150.1 lb

## 2012-06-25 DIAGNOSIS — I10 Essential (primary) hypertension: Secondary | ICD-10-CM

## 2012-06-25 DIAGNOSIS — R109 Unspecified abdominal pain: Secondary | ICD-10-CM

## 2012-06-25 DIAGNOSIS — R1030 Lower abdominal pain, unspecified: Secondary | ICD-10-CM | POA: Insufficient documentation

## 2012-06-25 DIAGNOSIS — J45909 Unspecified asthma, uncomplicated: Secondary | ICD-10-CM

## 2012-06-25 LAB — BASIC METABOLIC PANEL
CO2: 28 mEq/L (ref 19–32)
Calcium: 9.5 mg/dL (ref 8.4–10.5)
GFR: 81.05 mL/min (ref >60.00)
Sodium: 138 mEq/L (ref 135–145)

## 2012-06-25 LAB — CBC WITH DIFFERENTIAL/PLATELET
Basophils Absolute: 0 10*3/uL (ref 0.0–0.1)
Eosinophils Absolute: 0.2 10*3/uL (ref 0.0–0.7)
HCT: 40 % (ref 36.0–46.0)
Lymphs Abs: 3 10*3/uL (ref 0.7–4.0)
MCHC: 34.2 g/dL (ref 30.0–36.0)
Monocytes Relative: 6.7 % (ref 3.0–12.0)
Neutro Abs: 7.4 10*3/uL (ref 1.4–7.7)
Platelets: 341 10*3/uL (ref 150.0–400.0)
RDW: 13.6 % (ref 11.5–14.6)

## 2012-06-25 LAB — HEPATIC FUNCTION PANEL
AST: 23 U/L (ref 0–37)
Albumin: 4.1 g/dL (ref 3.5–5.2)
Alkaline Phosphatase: 74 U/L (ref 39–117)
Bilirubin, Direct: 0 mg/dL (ref 0.0–0.3)
Total Protein: 7.4 g/dL (ref 6.0–8.3)

## 2012-06-25 LAB — URINALYSIS, ROUTINE W REFLEX MICROSCOPIC
Bilirubin Urine: NEGATIVE
Hgb urine dipstick: NEGATIVE
Ketones, ur: NEGATIVE
Leukocytes, UA: NEGATIVE
Nitrite: NEGATIVE
Urobilinogen, UA: 0.2 (ref 0.0–1.0)
pH: 6 (ref 5.0–8.0)

## 2012-06-25 MED ORDER — BUTALBITAL-ASPIRIN-CAFFEINE 50-325-40 MG PO CAPS: 1.0000 | ORAL_CAPSULE | Freq: Every day | ORAL | Status: DC | PRN

## 2012-06-25 MED ORDER — METRONIDAZOLE 250 MG PO TABS: 250.0000 mg | ORAL_TABLET | Freq: Three times a day (TID) | ORAL | Status: DC

## 2012-06-25 MED ORDER — CIPROFLOXACIN HCL 500 MG PO TABS: 500.0000 mg | ORAL_TABLET | Freq: Two times a day (BID) | ORAL | Status: DC

## 2012-06-25 MED ORDER — AMITRIPTYLINE HCL 50 MG PO TABS: 50.0000 mg | ORAL_TABLET | Freq: Every evening | ORAL | Status: DC | PRN

## 2012-06-25 NOTE — Assessment & Plan Note (Signed)
stable overall by history and exam, recent data reviewed with pt, and pt to continue medical treatment as before,  to f/u any worsening symptoms or concerns SpO2 Readings from Last 3 Encounters:  06/25/12 98%  04/20/12 99%  04/16/12 96%

## 2012-06-25 NOTE — Patient Instructions (Addendum)
Please take all new medication as prescribed Please continue all other medications as before Please go to the LAB in the Basement (turn left off the elevator) for the tests to be done today You will be contacted by phone if any changes need to be made immediately.  Otherwise, you will receive a letter about your results with an explanation, but please check with MyChart first. If the urine testing is negative, we would most likely need to add Flagyl as a second antibiotic, and refer GI Thank you for enrolling in MyChart. Please follow the instructions below to securely access your online medical record. MyChart allows you to send messages to your doctor, view your test results, renew your prescriptions, schedule appointments, and more.

## 2012-06-25 NOTE — Assessment & Plan Note (Signed)
stable overall by history and exam, recent data reviewed with pt, and pt to continue medical treatment as before,  to f/u any worsening symptoms or concerns BP Readings from Last 3 Encounters:  06/25/12 110/82  04/20/12 120/84  04/16/12 100/70

## 2012-06-25 NOTE — Progress Notes (Signed)
Subjective:    [name], female    DOB: Jul 12, 1950, 62 y.o.   MRN: [id number]  HPI  Here with 1-2 days onset moderate crampy abd pain, ? Feverish, feeling ill, general weakness and maliase, poorly localized pain but more lower abd, with some decreasd appetitie but no n/v, distension, worsening reflux, dysphagia, recent wt loss, radiation, bowel change or blood.Denies urinary symptoms such as dysuria, frequency, urgency, flank pain, hematuria or n/v, fever, chills.  Is s/p tah bso per pt, appy.  No change in discomfort with eating, position, no prior hx of same.  Pt denies chest pain, increased sob or doe, wheezing, orthopnea, PND, increased LE swelling, palpitations, dizziness or syncope. Pt denies new neurological symptoms such as new headache, or facial or extremity weakness or numbness. + hx of diverticulitis per pt, last colonoscopy 06/2006. Past Medical History  Diagnosis Date  . ALLERGIC RHINITIS   . [name]   . ANXIETY   . ASTHMA   . DEPRESSION   . HYPERLIPIDEMIA   . COMMON MIGRAINE   . [name]   . DIVERTICULOSIS, COLON   . Mild mitral regurgitation by prior echocardiogram    Past Surgical History  Procedure Date  . Appendectomy   . Abdominal hysterectomy     reports that she has quit smoking. She quit smokeless tobacco use about 15 years ago. She reports that she does not drink alcohol or use illicit drugs. family history includes Heart disease in her brother and father.  There is no history of Colon cancer. Allergies  Allergen Reactions  . Trazodone And Nefazodone    Current Outpatient Prescriptions on File Prior to Visit  Medication Sig Dispense Refill  . fexofenadine (ALLEGRA) 180 MG tablet Take 1 tablet (180 mg total) by mouth daily as needed.  30 tablet  1  . simvastatin (ZOCOR) 40 MG tablet Take 1 tablet (40 mg total) by mouth daily.  90 tablet  3  . amitriptyline (ELAVIL) 50 MG tablet Take 1 tablet (50 mg total) by  mouth at bedtime as needed for sleep.  90 tablet  1  . butalbital-aspirin-caffeine (FIORINAL) 50-325-40 MG per capsule Take 1 capsule by mouth daily as needed.  60 capsule  1   Review of Systems  Constitutional: Negative for unexpected weight change, or unusual diaphoresis  HENT: Negative for tinnitus.   Eyes: Negative for photophobia and visual disturbance.  Respiratory: Negative for choking and stridor.   Gastrointestinal: Negative for vomiting and blood in stool.  Genitourinary: Negative for hematuria and decreased urine volume.  Musculoskeletal: Negative for acute joint swelling Skin: Negative for color change and wound.  Neurological: Negative for tremors and numbness other than noted  Psychiatric/Behavioral: Negative for decreased concentration or  hyperactivity.       Objective:   Physical Exam BP 110/82  Pulse 85  Temp 97.5 F (36.4 C) (Oral)  Ht 5\' 1"  (1.549 m)  Wt 150 lb 2 oz (68.096 kg)  BMI 28.37 kg/m2  SpO2 98% VS noted, fatigued, mild ill Constitutional: Pt appears well-developed and well-nourished.  HENT: Head: NCAT.  Right Ear: External ear normal.  Left Ear: External ear normal.  Eyes: Conjunctivae and EOM are normal. Pupils are equal, round, and reactive to light.  Neck: Normal range of motion. Neck supple.  Cardiovascular: Normal rate and regular rhythm.   Pulmonary/Chest: Effort normal and breath sounds normal.  Abd:  Soft, non-distended, + BS, but tender to RLQ/mid low abd and somewhat less LLQ and  mid abd/RUQ without guarding Neurological: Pt is alert. Not confused  Skin: Skin is warm. No erythema.  Psychiatric: Pt behavior is normal. Thought content normal.     Assessment & Plan:

## 2012-06-25 NOTE — Telephone Encounter (Signed)
fioricet - Done hardcopy to robin  Elavil and flagyl  - done erx  Referral done to GI

## 2012-06-25 NOTE — Assessment & Plan Note (Addendum)
Somewhat hard to localize but more low mid abd/rlq/ruq, cant r/o recurrent diverticulitis though is afeb, or UTI or other, for labs today, Abd u/s and empiric cipro asd, if UA neg would consider adding flagyl and refer GI - may need f/u colonscopy, CT

## 2012-06-26 ENCOUNTER — Telehealth: Payer: Self-pay

## 2012-06-26 MED ORDER — BUTALBITAL-APAP-CAFFEINE 50-325-40 MG PO TABS: 1.0000 | ORAL_TABLET | Freq: Two times a day (BID) | ORAL | Status: AC | PRN

## 2012-06-26 NOTE — Telephone Encounter (Signed)
Butalbital-ASA-Caffeine has been discontinued, is it ok to dispense Fioricet (has acetaminophen instead of aspirin)

## 2012-06-26 NOTE — Telephone Encounter (Signed)
Ok with me  Do I need to do new rx?

## 2012-06-26 NOTE — Telephone Encounter (Signed)
Pharmacy would like a new rx

## 2012-06-26 NOTE — Telephone Encounter (Signed)
Patient informed of results, referral and faxed hardcopy to pharmacy

## 2012-06-26 NOTE — Telephone Encounter (Signed)
Done hardcopy to robin  

## 2012-06-29 ENCOUNTER — Ambulatory Visit
Admission: RE | Admit: 2012-06-29 | Discharge: 2012-06-29 | Disposition: A | Discharge status: Home / Self Care | Payer: BC Managed Care – PPO | Source: Ambulatory Visit | Attending: Internal Medicine | Admitting: Internal Medicine

## 2012-06-29 DIAGNOSIS — R109 Unspecified abdominal pain: Secondary | ICD-10-CM

## 2012-06-29 NOTE — Telephone Encounter (Signed)
Faxed hardcopy to [place] 

## 2012-07-13 ENCOUNTER — Telehealth: Payer: Self-pay | Admitting: Internal Medicine

## 2012-07-13 NOTE — Telephone Encounter (Signed)
No answer and no voice mail.

## 2012-07-13 NOTE — Telephone Encounter (Signed)
To see regina asap

## 2012-07-13 NOTE — Telephone Encounter (Signed)
[facility] Triage Call Report Triage Record Num: [id number] Operator: Thayer Headings Patient Name: [name] Call Date & Time: 07/09/2012 1:39:56PM Patient Phone: [phone] PCP: Oliver Barre Patient Gender: Female PCP Fax : [phone] Patient DOB: [birth year] [facility] Name: [facility] Reason for Call: Caller: [name]/Patient; PCP: Oliver Barre (Adults only); CB#: [phone]; Calling today 07/09/12 regarding thinks she broke her big toe on right foot. Does not know what happened she just heard it crack. This happened yesterday. Toe is very swollen. Emergent symptoms r/o by Foot Injury guidelines with exception of new marked swelling. (See Provider Within 4 Hours). Advised to urgent care in her area (pt is out of town in [place]). Care advice given. Protocol(s) Used: Foot Injury Recommended Outcome per Protocol: See Provider within 4 hours Reason for Outcome: New marked swelling (twice the normal size as compared to usual appearance) Care Advice: ~ SYMPTOM / CONDITION MANAGEMENT ~ DO NOT attempt to place any weight on the affected extremity until evaluated by provider. Analgesic/Antipyretic Advice - Acetaminophen: Consider acetaminophen as directed on label or by pharmacist/provider for pain or fever PRECAUTIONS: - Use if there is no history of liver disease, alcoholism, or intake of three or more alcohol drinks per day - Only if approved by provider during pregnancy or when breastfeeding - During pregnancy, acetaminophen should not be taken more than 3 consecutive days without telling provider - Do not exceed recommended dose or frequency ~Apply cloth-covered ice pack, cold gel pack, or a cold wet compress to the affected area to reduce swelling, bruising and pain. Apply to area for 20 minutes, then remove for 20 minutes; continue alternating cold application until evaluated by a provider.

## 2012-07-14 NOTE — Telephone Encounter (Signed)
Pt is out of state.  She saw someone in [place].  Told her to call when she comes home if she needs to follow up.

## 2012-07-21 ENCOUNTER — Encounter: Payer: Self-pay | Admitting: Cardiovascular Disease

## 2012-07-21 ENCOUNTER — Ambulatory Visit (INDEPENDENT_AMBULATORY_CARE_PROVIDER_SITE_OTHER): Payer: BC Managed Care – PPO | Admitting: Cardiovascular Disease

## 2012-07-21 ENCOUNTER — Other Ambulatory Visit (INDEPENDENT_AMBULATORY_CARE_PROVIDER_SITE_OTHER): Payer: BC Managed Care – PPO

## 2012-07-21 VITALS — BP 98/67 | HR 81 | Ht 61.0 in | Wt 146.0 lb

## 2012-07-21 DIAGNOSIS — E785 Hyperlipidemia, unspecified: Secondary | ICD-10-CM

## 2012-07-21 DIAGNOSIS — R079 Chest pain, unspecified: Secondary | ICD-10-CM

## 2012-07-21 DIAGNOSIS — R002 Palpitations: Secondary | ICD-10-CM

## 2012-07-21 LAB — HEPATIC FUNCTION PANEL
Albumin: 4.1 g/dL (ref 3.5–5.2)
Alkaline Phosphatase: 65 U/L (ref 39–117)
Bilirubin, Direct: 0 mg/dL (ref 0.0–0.3)

## 2012-07-21 LAB — LIPID PANEL
LDL Cholesterol: 68 mg/dL (ref 0–99)
Total CHOL/HDL Ratio: 3
Triglycerides: 148 mg/dL (ref 0.0–149.0)

## 2012-07-21 LAB — BASIC METABOLIC PANEL
BUN: 17 mg/dL (ref 6–23)
CO2: 25 mEq/L (ref 19–32)
Chloride: 107 mEq/L (ref 96–112)
Creatinine, Ser: 0.8 mg/dL (ref 0.4–1.2)

## 2012-07-21 NOTE — Patient Instructions (Addendum)
Your physician recommends that you schedule a follow-up appointment in: as needed basis  Follow up with your primary care physician for your cholesterol management.

## 2012-07-21 NOTE — Progress Notes (Signed)
[name] Date of Birth  04-09-1951       [facility]    [place] [address], [address]  [address], [address] [place], [place]  [address]   [place], [place]  [address] [phone]     [phone]   Fax  [phone]    Fax [phone]  Problem List: 1. Chest pressure 2. Hyperlipidemia 3. Mild MR  History of Present Illness:  [name] is a 62 yo who presents for further evaluation of episodes of chest pressure and also palpitations.  The chest pressure sensation occurs spontaneously- not associated with exercise, mental stress, eating, or drinking. It seems to last for several minutes and then resolve spontaneously.  It is not associated with indigestion or .  The pain seems to radiate to her right shoulder and intrascapular region.  She also has new palpitation.  She woke up with a fast HR this am. She denies any syncope or presyncope. These episodes of palpitations do not seem to be associated with the before mentioned chest pressure.  She exercises every day.  She walks her dog every day.   She seems to have a decreased appetite over the past 2 weeks.  Feb. 25, 2014 She had a normal stress echo since I last saw her.  She has had a long vacation and feels much better  Current Outpatient Prescriptions on File Prior to Visit  Medication Sig Dispense Refill  . amitriptyline (ELAVIL) 50 MG tablet Take 1 tablet (50 mg total) by mouth at bedtime as needed for sleep.  90 tablet  1  . butalbital-acetaminophen-caffeine (FIORICET) 50-325-40 MG per tablet Take 1-2 tablets by mouth 2 (two) times daily as needed for headache.  60 tablet  1  . fexofenadine (ALLEGRA) 180 MG tablet Take 1 tablet (180 mg total) by mouth daily as needed.  30 tablet  1  . simvastatin (ZOCOR) 40 MG tablet Take 1 tablet (40 mg total) by mouth daily.  90 tablet  3   No current facility-administered medications on file prior to visit.    Allergies  Allergen Reactions  . Trazodone  And Nefazodone     Past Medical History  Diagnosis Date  . ALLERGIC RHINITIS   . [name]   . ANXIETY   . ASTHMA   . DEPRESSION   . HYPERLIPIDEMIA   . COMMON MIGRAINE   . [name]   . DIVERTICULOSIS, COLON   . Mild mitral regurgitation by prior echocardiogram     Past Surgical History  Procedure Laterality Date  . Appendectomy    . Abdominal hysterectomy      History  Smoking status  . Former Smoker  Smokeless tobacco  . Former Neurosurgeon  . Quit date: 05/27/1997    History  Alcohol Use No    Comment: occasional     Family History  Problem Relation Age of Onset  . Heart disease Father   . Heart disease Brother   . Colon cancer Neg Hx     Reviw of Systems:  Reviewed in the HPI.  All other systems are negative.  Physical Exam: Blood pressure 98/67, pulse 81, height 5\' 1"  (1.549 m), weight 146 lb (66.225 kg). General: Well developed, well nourished, in no acute distress.  Head: Normocephalic, atraumatic, sclera non-icteric, mucus membranes are moist,   Neck: Supple. Carotids are 2 + without bruits. No JVD   Lungs: Clear   Heart: RR, normal S1, S2  Abdomen: Soft, non-tender, non-distended  with normal bowel sounds.  Msk:  Strength and tone are normal  Extremities: No clubbing or cyanosis. No edema.  Distal pedal pulses are 2+ and equal    Neuro: CN II - XII intact.  Alert and oriented X 3.   Psych:  unremarkable  ECG: Nov, 21, 2013 , NSR at 104.  No ST or T wave changes.  Assessment / Plan:

## 2012-07-21 NOTE — Assessment & Plan Note (Signed)
Continued to be doing quite well. She took a long vacation and she's not having any further episodes of chest pain. We performed a stress echocardiogram which was normal. We also placed an event monitor her. She was to have occasional premature ventricular contractions which are entirely benign. His normal LV function.  We will followup with her on an as-needed basis. We'll be checking her fasting lipids today since we changed the dose of her simvastatin. She'll followup for this with Dr. Jonny Ruiz.

## 2012-07-27 ENCOUNTER — Telehealth: Payer: Self-pay | Admitting: Internal Medicine

## 2012-07-27 ENCOUNTER — Other Ambulatory Visit (INDEPENDENT_AMBULATORY_CARE_PROVIDER_SITE_OTHER): Payer: BC Managed Care – PPO

## 2012-07-27 ENCOUNTER — Encounter: Payer: Self-pay | Admitting: Internal Medicine

## 2012-07-27 ENCOUNTER — Telehealth: Payer: Self-pay | Admitting: Gastroenterology

## 2012-07-27 ENCOUNTER — Ambulatory Visit (INDEPENDENT_AMBULATORY_CARE_PROVIDER_SITE_OTHER): Payer: BC Managed Care – PPO | Admitting: Internal Medicine

## 2012-07-27 VITALS — BP 140/80 | HR 85 | Temp 97.6°F | Ht 61.0 in | Wt 140.8 lb

## 2012-07-27 DIAGNOSIS — R1032 Left lower quadrant pain: Secondary | ICD-10-CM

## 2012-07-27 DIAGNOSIS — K921 Melena: Secondary | ICD-10-CM

## 2012-07-27 LAB — BASIC METABOLIC PANEL
BUN: 14 mg/dL (ref 6–23)
Chloride: 106 mEq/L (ref 96–112)
Creatinine, Ser: 0.7 mg/dL (ref 0.4–1.2)
Glucose, Bld: 108 mg/dL — ABNORMAL HIGH (ref 70–99)

## 2012-07-27 LAB — CBC
Hemoglobin: 14.1 g/dL (ref 12.0–15.0)
MCHC: 33.9 g/dL (ref 30.0–36.0)
Platelets: 309 10*3/uL (ref 150.0–400.0)
RBC: 4.84 Mil/uL (ref 3.87–5.11)

## 2012-07-27 NOTE — Telephone Encounter (Signed)
Patient is scheduled for 07/28/12 3:00 with Doug Sou, PA

## 2012-07-27 NOTE — Patient Instructions (Signed)

## 2012-07-27 NOTE — Progress Notes (Signed)
Subjective:    [name], female    DOB: 1950-09-06, 62 y.o.   MRN: [id number]  HPI  Pt presents to the clinic today with c/o ongoing abdominal pain and blood in her stool. The abdominal pain seems to be a chronic issue for her. She was seen by Dr. Jonny Ruiz 06/25/12 for the same. He obtained an ultrasound of her abdomen which was negative, and started her on Cipro/Flagyl for a possible flare of diverticulitis. He also recommended that she go see her GI doctor. Since that time, her abdominal pain has stayed about the same.Over the past day and a half, she has noticed bright red streak of blood in her stool. She does not have hemorrhoids that she knows of. When she has a BM, she does have crampy abdominal pain. She has not taken anything for the pain. She does not have hard stools. She only has a BM every 2-3 days. She has had a colonoscopy in the past which was normal.  Review of Systems   Past Medical History  Diagnosis Date  . ALLERGIC RHINITIS   . [name]   . ANXIETY   . ASTHMA   . DEPRESSION   . HYPERLIPIDEMIA   . COMMON MIGRAINE   . [name]   . DIVERTICULOSIS, COLON   . Mild mitral regurgitation by prior echocardiogram     Current Outpatient Prescriptions  Medication Sig Dispense Refill  . amitriptyline (ELAVIL) 50 MG tablet Take 1 tablet (50 mg total) by mouth at bedtime as needed for sleep.  90 tablet  1  . butalbital-acetaminophen-caffeine (FIORICET) 50-325-40 MG per tablet Take 1-2 tablets by mouth 2 (two) times daily as needed for headache.  60 tablet  1  . fexofenadine (ALLEGRA) 180 MG tablet Take 1 tablet (180 mg total) by mouth daily as needed.  30 tablet  1  . simvastatin (ZOCOR) 40 MG tablet Take 1 tablet (40 mg total) by mouth daily.  90 tablet  3  . Solifenacin Succinate (VESICARE PO) Take by mouth daily.       No current facility-administered medications for this visit.    Allergies  Allergen Reactions  . Trazodone  And Nefazodone     Family History  Problem Relation Age of Onset  . Heart disease Father   . Heart disease Brother   . Colon cancer Neg Hx     History   Social History  . Marital Status: Single    Spouse Name: N/A    Number of Children: N/A  . Years of Education: N/A   Occupational History  . Not on file.   Social History Main Topics  . Smoking status: Former Games developer  . Smokeless tobacco: Former Neurosurgeon    Quit date: 05/27/1997  . Alcohol Use: No     Comment: occasional   . Drug Use: No  . Sexually Active: Not on file   Other Topics Concern  . Not on file   Social History Narrative   Occasional  Caffeine drinker      Constitutional: Denies fever, malaise, fatigue, headache or abrupt weight changes.    Cardiovascular: Denies chest pain, chest tightness, palpitations or swelling in the hands or feet.  Gastrointestinal: Pt reports abdominal pain and blood in her stool. Denies bloating, constipation, diarrhea.  GU: Denies urgency, frequency, pain with urination, burning sensation, blood in urine, odor or discharge.   No other specific complaints in a complete review of systems (except as listed in HPI above).  Objective:   Physical Exam   BP 140/80  Pulse 85  Temp(Src) 97.6 F (36.4 C) (Oral)  Ht 5\' 1"  (1.549 m)  Wt 140 lb 12 oz (63.844 kg)  BMI 26.61 kg/m2  SpO2 99% Wt Readings from Last 3 Encounters:  07/27/12 140 lb 12 oz (63.844 kg)  07/21/12 146 lb (66.225 kg)  06/25/12 150 lb 2 oz (68.096 kg)    General: Appears her stated age, well developed, well nourished in NAD. Cardiovascular: Normal rate and rhythm. S1,S2 noted.  No murmur, rubs or gallops noted. No JVD or BLE edema. No carotid bruits noted. Pulmonary/Chest: Normal effort and positive vesicular breath sounds. No respiratory distress. No wheezes, rales or ronchi noted.  Abdomen: Soft and nontender. Normal bowel sounds, no bruits noted. No distention or masses noted. Liver, spleen and kidneys  non palpable. Pt declines rectal exam.       Assessment & Plan:   Abdominal pain with blood in stool, new onset with additional workup required:  Will obtain CBC and BMET today Referral placed for [facility].  RTC if bleeding persist or get worse, or if you feel lightheaded or faint

## 2012-07-27 NOTE — Telephone Encounter (Signed)
[facility] Triage Call Report Triage Record Num: [id number] Operator: Edgar Frisk Patient Name: [name] Call Date & Time: 07/27/2012 6:52:15AM Patient Phone: [phone] PCP: Oliver Barre Patient Gender: Female PCP Fax : [phone] Patient DOB: 12/18/1950 [facility] Name[facility] Reason for Call: Caller: Kursten/Patient; PCP: Oliver Barre (Adults only); CB#: [phone]; Call regarding Moderate To Large Amount of Blood in Stool Abdominal Pain; Pt reports has severe abdominal pain, rates 10 on 1-10 pain scale with dizziness and bloody diarrhea Advised 911 now Protocol(s) Used: Gastrointestinal Bleeding Recommended Outcome per Protocol: Activate EMS 911 Reason for Outcome: Passing red, black or tarry material from rectum AND onset of new signs and symptoms of hypovolemia Care Advice: ~ Do not give the patient anything to eat or drink.

## 2012-07-28 ENCOUNTER — Ambulatory Visit (INDEPENDENT_AMBULATORY_CARE_PROVIDER_SITE_OTHER): Payer: BC Managed Care – PPO | Admitting: Gastroenterology

## 2012-07-28 ENCOUNTER — Encounter: Payer: Self-pay | Admitting: Gastroenterology

## 2012-07-28 ENCOUNTER — Encounter: Payer: Self-pay | Admitting: Internal Medicine

## 2012-07-28 VITALS — BP 90/60 | HR 70 | Temp 97.4°F | Ht 61.0 in | Wt 145.8 lb

## 2012-07-28 DIAGNOSIS — K922 Gastrointestinal hemorrhage, unspecified: Secondary | ICD-10-CM | POA: Insufficient documentation

## 2012-07-28 DIAGNOSIS — R109 Unspecified abdominal pain: Secondary | ICD-10-CM

## 2012-07-28 MED ORDER — HYOSCYAMINE SULFATE 0.125 MG SL SUBL: 0.1250 mg | SUBLINGUAL_TABLET | Freq: Three times a day (TID) | SUBLINGUAL | Status: DC | PRN

## 2012-07-28 MED ORDER — MOVIPREP 100 G PO SOLR: ORAL | Status: DC

## 2012-07-28 NOTE — Progress Notes (Signed)
07/28/2012 [name] [id number] 07/22/1950   History of Present Illness: Patient is a pleasant 62 year old female who is a patient of Dr. Ardell Isaacs.  She last had a colonoscopy in 06/2006, which showed only diverticulosis at that time.  She has been seen on occasion for complaints of abdominal pain.  There was a CT scan performed 04/2011, which was unremarkable and did not show any sign of diverticulitis.  She comes in today complaining of rectal bleeding and abdominal pain.  She says that for the last couple of weeks she has been experiencing abdominal cramping, but two days ago the lower abdominal pain was quite severe.  Then shortly after developing the pain she had some BM's with blood clots.  She saw her PCP yesterday and labs showed a normal Hgb of 14.1 grams and normal WBC count.  Normal BMP.  Says that the bleeding stopped yesterday afternoon.  Abdominal pain has resolved as well and has only minimal discomfort and cramping.  Had nausea when she was having the severe pain, but no vomiting.    Normally has 2-3 good BM's a week.  No fevers.     Current Medications, Allergies, Past Medical History, Past Surgical History, Family History and Social History were reviewed in [employer] record.   Physical Exam: BP 90/60  Pulse 70  Temp(Src) 97.4 F (36.3 C) (Oral)  Ht 5\' 1"  (1.549 m)  Wt 152 lb (68.947 kg)  BMI 28.74 kg/m2 General: Well developed, white female in no acute distress Head: Normocephalic and atraumatic Eyes:  sclerae anicteric, conjunctiva pink  Ears: Normal auditory acuity Lungs: Clear throughout to auscultation. Heart: Regular rate and rhythm Abdomen: Soft, non-tender and non-distended. No masses, no hepatomegaly. Normal bowel sounds. Rectal: Deferred.  Will be done at the time of colonoscopy. Musculoskeletal: Symmetrical with no gross deformities  Extremities: No edema  Neurological: Alert oriented x 4, grossly nonfocal Psychological:  Alert and  cooperative. Normal mood and affect  Assessment and Recommendations: -LGIB with lower abdominal cramping:  Now resolved.  Likely secondary to diverticular bleed.  Possibly has some underlying IBS as well.  *Will schedule colonoscopy for evaluation.  The risks, benefits, and alternatives were discussed with the patient and she consents to proceed.  Will give her levsin to take prn for cramping.

## 2012-07-28 NOTE — Patient Instructions (Addendum)
You have been scheduled for a colonoscopy with propofol. Please follow written instructions given to you at your visit today.  Please use the coupon you have been given today to get a free moviprep. If you use inhalers (even only as needed) or a CPAP machine, please bring them with you on the day of your procedure.  You have been given a rx for generic Levsin SL to take to your pharmacy.  Thank you for choosing [facility].

## 2012-07-29 NOTE — Progress Notes (Signed)
Reviewed and agree with management plan.  Malcolm T. Stark, MD FACG 

## 2012-08-04 ENCOUNTER — Ambulatory Visit (AMBULATORY_SURGERY_CENTER): Payer: BC Managed Care – PPO | Admitting: Gastroenterology

## 2012-08-04 ENCOUNTER — Encounter: Payer: Self-pay | Admitting: Gastroenterology

## 2012-08-04 VITALS — BP 111/70 | HR 67 | Temp 98.6°F | Resp 27 | Ht 61.0 in | Wt 145.0 lb

## 2012-08-04 DIAGNOSIS — K922 Gastrointestinal hemorrhage, unspecified: Secondary | ICD-10-CM

## 2012-08-04 DIAGNOSIS — R109 Unspecified abdominal pain: Secondary | ICD-10-CM

## 2012-08-04 MED ORDER — SODIUM CHLORIDE 0.9 % IV SOLN: 500.0000 mL | INTRAVENOUS | Status: DC

## 2012-08-04 NOTE — Op Note (Signed)
 [facility] [address] [place], [address]   COLONOSCOPY PROCEDURE REPORT  PATIENT: [name], [name]  MR#: [id number] BIRTHDATE: Dec 20, 1950 , 61  yrs. old GENDER: Female ENDOSCOPIST: Meryl Dare, MD, [facility] PROCEDURE DATE:  08/04/2012 PROCEDURE:   Colonoscopy, diagnostic ASA CLASS:   Class II INDICATIONS:hematochezia. MEDICATIONS: MAC sedation, administered by CRNA and propofol (Diprivan) 200mg  IV DESCRIPTION OF PROCEDURE:   After the risks benefits and alternatives of the procedure were thoroughly explained, informed consent was obtained.  A digital rectal exam revealed no abnormalities of the rectum.   The LB CF-[id number]  endoscope was introduced through the anus and advanced to the cecum, which was identified by both the appendix and ileocecal valve. No adverse events experienced.   The quality of the prep was good, using MoviPrep  The instrument was then slowly withdrawn as the colon was fully examined.   COLON FINDINGS: Mild diverticulosis was noted in the sigmoid colon. The colon was otherwise normal.  There was no diverticulosis, inflammation, polyps or cancers unless previously stated. Retroflexed views revealed no abnormalities. The time to cecum=2 minutes 44 seconds.  Withdrawal time=9 minutes 27 seconds.  The scope was withdrawn and the procedure completed.  COMPLICATIONS: There were no complications.  ENDOSCOPIC IMPRESSION: 1.   Mild diverticulosis was noted in the sigmoid colon 2.   The colon was otherwise normal  RECOMMENDATIONS: 1.  High fiber diet with liberal fluid intake. 2.  Continue current colorectal screening recommendations for "routine risk" patients with a repeat colonoscopy in 10 years.   eSigned:  Meryl Dare, MD, [facility] 08/04/2012 3:01 PM   [] 

## 2012-08-04 NOTE — Progress Notes (Signed)
Patient did not experience any of the following events: a burn prior to discharge; a fall within the facility; wrong site/side/patient/procedure/implant event; or a hospital transfer or hospital admission upon discharge from the facility. (G8907) Patient did not have preoperative order for IV antibiotic SSI prophylaxis. (G8918)  

## 2012-08-04 NOTE — Progress Notes (Signed)
Report to pacu rn, vss, bbs=clear 

## 2012-08-04 NOTE — Patient Instructions (Addendum)
Discharge instructions given with verbal understanding. Handouts on diverticulosis and a high fiber diet given. Resume previous medications.YOU HAD AN ENDOSCOPIC PROCEDURE TODAY AT THE [facility]: Refer to the procedure report that was given to you for any specific questions about what was found during the examination.  If the procedure report does not answer your questions, please call your gastroenterologist to clarify.  If you requested that your care partner not be given the details of your procedure findings, then the procedure report has been included in a sealed envelope for you to review at your convenience later.  YOU SHOULD EXPECT: Some feelings of bloating in the abdomen. Passage of more gas than usual.  Walking can help get rid of the air that was put into your GI tract during the procedure and reduce the bloating. If you had a lower endoscopy (such as a colonoscopy or flexible sigmoidoscopy) you may notice spotting of blood in your stool or on the toilet paper. If you underwent a bowel prep for your procedure, then you may not have a normal bowel movement for a few days.  DIET: Your first meal following the procedure should be a light meal and then it is ok to progress to your normal diet.  A half-sandwich or bowl of soup is an example of a good first meal.  Heavy or fried foods are harder to digest and may make you feel nauseous or bloated.  Likewise meals heavy in dairy and vegetables can cause extra gas to form and this can also increase the bloating.  Drink plenty of fluids but you should avoid alcoholic beverages for 24 hours.  ACTIVITY: Your care partner should take you home directly after the procedure.  You should plan to take it easy, moving slowly for the rest of the day.  You can resume normal activity the day after the procedure however you should NOT DRIVE or use heavy machinery for 24 hours (because of the sedation medicines used during the test).    SYMPTOMS TO  REPORT IMMEDIATELY: A gastroenterologist can be reached at any hour.  During normal business hours, 8:30 AM to 5:00 PM Monday through Friday, call [phone].  After hours and on weekends, please call the GI answering service at [phone] who will take a message and have the physician on call contact you.   Following lower endoscopy (colonoscopy or flexible sigmoidoscopy):  Excessive amounts of blood in the stool  Significant tenderness or worsening of abdominal pains  Swelling of the abdomen that is new, acute  Fever of 100F or higher  FOLLOW UP: If any biopsies were taken you will be contacted by phone or by letter within the next 1-3 weeks.  Call your gastroenterologist if you have not heard about the biopsies in 3 weeks.  Our staff will call the home number listed on your records the next business day following your procedure to check on you and address any questions or concerns that you may have at that time regarding the information given to you following your procedure. This is a courtesy call and so if there is no answer at the home number and we have not heard from you through the emergency physician on call, we will assume that you have returned to your regular daily activities without incident.  SIGNATURES/CONFIDENTIALITY: You and/or your care partner have signed paperwork which will be entered into your electronic medical record.  These signatures attest to the fact that that the information above on your   After Visit Summary has been reviewed and is understood.  Full responsibility of the confidentiality of this discharge information lies with you and/or your care-partner.   

## 2012-08-05 ENCOUNTER — Telehealth: Payer: Self-pay

## 2012-08-05 NOTE — Telephone Encounter (Signed)
  Follow up Call-  Call back number 08/04/2012  Post procedure Call Back phone  # [phone]  Permission to leave phone message Yes  comments May leave message with Magda Paganini that we called     Patient questions:  Do you have a fever, pain , or abdominal swelling? no Pain Score  0 *  Have you tolerated food without any problems? yes  Have you been able to return to your normal activities? yes  Do you have any questions about your discharge instructions: Diet   no Medications  no Follow up visit  no  Do you have questions or concerns about your Care? no  Actions: * If pain score is 4 or above: No action needed, pain <4.

## 2012-09-15 ENCOUNTER — Encounter: Payer: Self-pay | Admitting: Internal Medicine

## 2012-09-15 ENCOUNTER — Ambulatory Visit (INDEPENDENT_AMBULATORY_CARE_PROVIDER_SITE_OTHER): Payer: BC Managed Care – PPO | Admitting: Internal Medicine

## 2012-09-15 VITALS — BP 102/76 | HR 92 | Temp 98.1°F | Ht 61.0 in | Wt 147.0 lb

## 2012-09-15 DIAGNOSIS — B029 Zoster without complications: Secondary | ICD-10-CM

## 2012-09-15 MED ORDER — VALACYCLOVIR HCL 1 G PO TABS: 1000.0000 mg | ORAL_TABLET | Freq: Three times a day (TID) | ORAL | Status: DC

## 2012-09-15 MED ORDER — GABAPENTIN 100 MG PO CAPS: 100.0000 mg | ORAL_CAPSULE | Freq: Three times a day (TID) | ORAL | Status: DC

## 2012-09-15 NOTE — Patient Instructions (Signed)

## 2012-09-15 NOTE — Progress Notes (Signed)
Subjective:    [name], female    DOB: 1951/03/31, 62 y.o.   MRN: [id number]  HPI  Pt presents to the clinic today with c/o a rash on her left buttock just next to her gluteal fold. This started 3 days ago.  She has had shingles in the past and she reports this feels the same. It does burn a little bit. She has not put anything on it. She has had her shingles vaccines.  Review of Systems      Past Medical History  Diagnosis Date  . ALLERGIC RHINITIS   . [name]   . ANXIETY   . ASTHMA   . DEPRESSION   . HYPERLIPIDEMIA   . COMMON MIGRAINE   . [name]   . DIVERTICULOSIS, COLON   . Mild mitral regurgitation by prior echocardiogram     Current Outpatient Prescriptions  Medication Sig Dispense Refill  . amitriptyline (ELAVIL) 50 MG tablet Take 1 tablet (50 mg total) by mouth at bedtime as needed for sleep.  90 tablet  1  . butalbital-acetaminophen-caffeine (FIORICET) 50-325-40 MG per tablet Take 1-2 tablets by mouth 2 (two) times daily as needed for headache.  60 tablet  1  . fexofenadine (ALLEGRA) 180 MG tablet Take 1 tablet (180 mg total) by mouth daily as needed.  30 tablet  1  . hyoscyamine (LEVSIN SL) 0.125 MG SL tablet Place 1 tablet (0.125 mg total) under the tongue every 8 (eight) hours as needed for cramping.  30 tablet  1  . simvastatin (ZOCOR) 40 MG tablet Take 1 tablet (40 mg total) by mouth daily.  90 tablet  3  . Solifenacin Succinate (VESICARE PO) Take by mouth daily.       No current facility-administered medications for this visit.    Allergies  Allergen Reactions  . Trazodone And Nefazodone     Family History  Problem Relation Age of Onset  . Heart disease Father   . Heart disease Brother   . Colon cancer Neg Hx     History   Social History  . Marital Status: Single    Spouse Name: N/A    Number of Children: N/A  . Years of Education: N/A   Occupational History  . Not on file.   Social  History Main Topics  . Smoking status: Former Games developer  . Smokeless tobacco: Not on file  . Alcohol Use: No     Comment: occasional   . Drug Use: No  . Sexually Active: Not on file   Other Topics Concern  . Not on file   Social History Narrative   Occasional  Caffeine drinker      Constitutional: Denies fever, malaise, fatigue, headache or abrupt weight changes.  Skin: Pt reports rash on left buttock. Denies redness, or ulcercations.  Neurological: Pt reports burning sensation around lesions. Denies dizziness, difficulty with memory, difficulty with speech or problems with balance and coordination.   No other specific complaints in a complete review of systems (except as listed in HPI above).  Objective:   Physical Exam  BP 102/76  Pulse 92  Temp(Src) 98.1 F (36.7 C) (Oral)  Ht 5\' 1"  (1.549 m)  Wt 147 lb (66.679 kg)  BMI 27.79 kg/m2  SpO2 96% Wt Readings from Last 3 Encounters:  09/15/12 147 lb (66.679 kg)  08/04/12 145 lb (65.772 kg)  07/28/12 145 lb 12.8 oz (66.134 kg)    General: Appears her stated age, well developed, well  nourished in NAD. Skin: small group of vesicular lesions just left of the gluteal fold, resembling shingles. Cardiovascular: Normal rate and rhythm. S1,S2 noted.  No murmur, rubs or gallops noted. No JVD or BLE edema. No carotid bruits noted. Pulmonary/Chest: Normal effort and positive vesicular breath sounds. No respiratory distress. No wheezes, rales or ronchi noted.  Neurological: Alert and oriented. Cranial nerves II-XII intact. Coordination normal. +DTRs bilaterally.        Assessment & Plan:   Shingles of left buttock, new onset:  eRx for Valtrex 1 gm TID x 2 weeks eRx for Neurontin 100 mg TID Avoid touching the area if possible  RTC as needed or if symptoms persist

## 2012-09-21 ENCOUNTER — Encounter: Payer: Self-pay | Admitting: Gastroenterology

## 2012-09-21 NOTE — Telephone Encounter (Signed)
Your primary care doctor gave the medication for your shingles. Have to contact them. Thank you

## 2012-09-22 ENCOUNTER — Ambulatory Visit (INDEPENDENT_AMBULATORY_CARE_PROVIDER_SITE_OTHER): Payer: BC Managed Care – PPO | Admitting: Internal Medicine

## 2012-09-22 ENCOUNTER — Telehealth: Payer: Self-pay | Admitting: Internal Medicine

## 2012-09-22 ENCOUNTER — Encounter: Payer: Self-pay | Admitting: Internal Medicine

## 2012-09-22 ENCOUNTER — Ambulatory Visit (INDEPENDENT_AMBULATORY_CARE_PROVIDER_SITE_OTHER)
Admission: RE | Admit: 2012-09-22 | Discharge: 2012-09-22 | Disposition: A | Discharge status: Home / Self Care | Payer: BC Managed Care – PPO | Source: Ambulatory Visit | Attending: Internal Medicine | Admitting: Internal Medicine

## 2012-09-22 ENCOUNTER — Other Ambulatory Visit (INDEPENDENT_AMBULATORY_CARE_PROVIDER_SITE_OTHER): Payer: BC Managed Care – PPO

## 2012-09-22 ENCOUNTER — Other Ambulatory Visit: Payer: Self-pay | Admitting: Internal Medicine

## 2012-09-22 VITALS — BP 110/72 | HR 99 | Temp 97.9°F | Ht 61.0 in | Wt 146.0 lb

## 2012-09-22 DIAGNOSIS — R1032 Left lower quadrant pain: Secondary | ICD-10-CM

## 2012-09-22 DIAGNOSIS — R197 Diarrhea, unspecified: Secondary | ICD-10-CM

## 2012-09-22 DIAGNOSIS — K5792 Diverticulitis of intestine, part unspecified, without perforation or abscess without bleeding: Secondary | ICD-10-CM

## 2012-09-22 DIAGNOSIS — R110 Nausea: Secondary | ICD-10-CM

## 2012-09-22 LAB — CBC
HCT: 38.7 % (ref 36.0–46.0)
Hemoglobin: 13.9 g/dL (ref 12.0–15.0)
MCHC: 35.9 g/dL (ref 30.0–36.0)
MCV: 86.1 fl (ref 78.0–100.0)
RDW: 13.8 % (ref 11.5–14.6)

## 2012-09-22 LAB — BASIC METABOLIC PANEL
BUN: 7 mg/dL (ref 6–23)
Calcium: 9 mg/dL (ref 8.4–10.5)
Chloride: 105 mEq/L (ref 96–112)
Creatinine, Ser: 0.8 mg/dL (ref 0.4–1.2)
GFR: 74.27 mL/min (ref >60.00)

## 2012-09-22 MED ORDER — METRONIDAZOLE 500 MG PO TABS: 500.0000 mg | ORAL_TABLET | Freq: Three times a day (TID) | ORAL | Status: DC

## 2012-09-22 MED ORDER — CIPROFLOXACIN HCL 500 MG PO TABS: 500.0000 mg | ORAL_TABLET | Freq: Two times a day (BID) | ORAL | Status: DC

## 2012-09-22 MED ORDER — IOHEXOL 300 MG/ML  SOLN
100.0000 mL | Freq: Once | INTRAMUSCULAR | Status: AC | PRN
  Administered 2012-09-22: 100 mL via INTRAVENOUS

## 2012-09-22 NOTE — Telephone Encounter (Signed)
[facility] Triage Call Report Triage Record Num: [id number] Operator: Freddie Breech Patient Name: [name] Call Date & Time: 09/21/2012 5:58:31PM Patient Phone: [phone] PCP: Oliver Barre Patient Gender: Female PCP Fax : [phone] Patient DOB: 05/05/1951 [facility] Reason for Call: Pt is calling is calling as she has a sharp to dull cramp in her L lower abdomen that she woke up with today. Pain inceases with ambulation. Has been steady for > 3 hours. Pt had to leave work due to the pain. Temp 99.9. She is being tx'd for shingles on her buttock but is having no pain with that at present. Pt is advised UC now per Abdominal Pain Protocol. Protocol(s) Used: Abdominal Pain Recommended Outcome per Protocol: See ED Immediately Reason for Outcome: Abdominal pain that has steadily worsened over hours OR has been continuous for 3 hours or more AND any of the following: loss of appetite, vomiting starting after pain, any fever, OR unable to carry out normal activities Care Advice: ~ Pain medication or laxatives should not be taken until symptoms are evaluated. 04/

## 2012-09-22 NOTE — Telephone Encounter (Signed)
appt with Rene Kocher today

## 2012-09-22 NOTE — Patient Instructions (Signed)
Diverticulitis °A diverticulum is a small pouch or sac on the colon. Diverticulosis is the presence of these diverticula on the colon. Diverticulitis is the irritation (inflammation) or infection of diverticula. °CAUSES  °The colon and its diverticula contain bacteria. If food particles block the tiny opening to a diverticulum, the bacteria inside can grow and cause an increase in pressure. This leads to infection and inflammation and is called diverticulitis. °SYMPTOMS  °· Abdominal pain and tenderness. Usually, the pain is located on the left side of your abdomen. However, it could be located elsewhere. °· Fever. °· Bloating. °· Feeling sick to your stomach (nausea). °· Throwing up (vomiting). °· Abnormal stools. °DIAGNOSIS  °Your caregiver will take a history and perform a physical exam. Since many things can cause abdominal pain, other tests may be necessary. Tests may include: °· Blood tests. °· Urine tests. °· X-ray of the abdomen. °· CT scan of the abdomen. °Sometimes, surgery is needed to determine if diverticulitis or other conditions are causing your symptoms. °TREATMENT  °Most of the time, you can be treated without surgery. Treatment includes: °· Resting the bowels by only having liquids for a few days. As you improve, you will need to eat a low-fiber diet. °· Intravenous (IV) fluids if you are losing body fluids (dehydrated). °· Antibiotic medicines that treat infections may be given. °· Pain and nausea medicine, if needed. °· Surgery if the inflamed diverticulum has burst. °HOME CARE INSTRUCTIONS  °· Try a clear liquid diet (broth, tea, or water for as long as directed by your caregiver). You may then gradually begin a low-fiber diet as tolerated. A low-fiber diet is a diet with less than 10 grams of fiber. Choose the foods below to reduce fiber in the diet: °· White breads, cereals, rice, and pasta. °· Cooked fruits and vegetables or soft fresh fruits and vegetables without the skin. °· Ground or  well-cooked tender beef, ham, veal, lamb, pork, or poultry. °· Eggs and seafood. °· After your diverticulitis symptoms have improved, your caregiver may put you on a high-fiber diet. A high-fiber diet includes 14 grams of fiber for every 1000 calories consumed. For a standard 2000 calorie diet, you would need 28 grams of fiber. Follow these diet guidelines to help you increase the fiber in your diet. It is important to slowly increase the amount fiber in your diet to avoid gas, constipation, and bloating. °· Choose whole-grain breads, cereals, pasta, and brown rice. °· Choose fresh fruits and vegetables with the skin on. Do not overcook vegetables because the more vegetables are cooked, the more fiber is lost. °· Choose more nuts, seeds, legumes, dried peas, beans, and lentils. °· Look for food products that have greater than 3 grams of fiber per serving on the Nutrition Facts label. °· Take all medicine as directed by your caregiver. °· If your caregiver has given you a follow-up appointment, it is very important that you go. Not going could result in lasting (chronic) or permanent injury, pain, and disability. If there is any problem keeping the appointment, call to reschedule. °SEEK MEDICAL CARE IF:  °· Your pain does not improve. °· You have a hard time advancing your diet beyond clear liquids. °· Your bowel movements do not return to normal. °SEEK IMMEDIATE MEDICAL CARE IF:  °· Your pain becomes worse. °· You have an oral temperature above 102° F (38.9° C), not controlled by medicine. °· You have repeated vomiting. °· You have bloody or black, tarry stools. °· Symptoms   that brought you to your caregiver become worse or are not getting better. °MAKE SURE YOU:  °· Understand these instructions. °· Will watch your condition. °· Will get help right away if you are not doing well or get worse. °Document Released: 02/20/2005 Document Revised: 08/05/2011 Document Reviewed: 06/18/2010 °[employer]® Patient Information  ©2013 [employer]. ° °

## 2012-09-22 NOTE — Progress Notes (Signed)
Subjective:    [name], female    DOB: [birth year], 62 y.o.   MRN: [id number]  HPI  Pt presents to the clinic today with c/o left lower abdominal pain and fever. This started 2 days ago. She has had an issue with the LLQ abdominal pain in the past. She recently had a colonoscopy which was reviewed and showed mild diverticulosis. She has had some diarrhea and nausea. She is taking tylenol for the fever. An ultrasound of the abdomen was done and was normal. She did have some relief with cipro/flagyl in the past with recurrent episodes but the pain has never fully resolved. She would like further evaluation of her abdominal pain today.  Review of Systems      Past Medical History  Diagnosis Date  . ALLERGIC RHINITIS   . [name]   . ANXIETY   . ASTHMA   . DEPRESSION   . HYPERLIPIDEMIA   . COMMON MIGRAINE   . [name]   . DIVERTICULOSIS, COLON   . Mild mitral regurgitation by prior echocardiogram     Current Outpatient Prescriptions  Medication Sig Dispense Refill  . amitriptyline (ELAVIL) 50 MG tablet Take 1 tablet (50 mg total) by mouth at bedtime as needed for sleep.  90 tablet  1  . butalbital-acetaminophen-caffeine (FIORICET) 50-325-40 MG per tablet Take 1-2 tablets by mouth 2 (two) times daily as needed for headache.  60 tablet  1  . fexofenadine (ALLEGRA) 180 MG tablet Take 1 tablet (180 mg total) by mouth daily as needed.  30 tablet  1  . gabapentin (NEURONTIN) 100 MG capsule Take 1 capsule (100 mg total) by mouth 3 (three) times daily.  60 capsule  0  . simvastatin (ZOCOR) 40 MG tablet Take 1 tablet (40 mg total) by mouth daily.  90 tablet  3  . valACYclovir (VALTREX) 1000 MG tablet Take 1 tablet (1,000 mg total) by mouth 3 (three) times daily.  42 tablet  0  . VESICARE 5 MG tablet Take 5 mg by mouth daily.        No current facility-administered medications for this visit.    Allergies  Allergen Reactions  . Trazodone  And Nefazodone     Family History  Problem Relation Age of Onset  . Heart disease Father   . Heart disease Brother   . Colon cancer Neg Hx     History   Social History  . Marital Status: Single    Spouse Name: N/A    Number of Children: N/A  . Years of Education: N/A   Occupational History  . Not on file.   Social History Main Topics  . Smoking status: Former Games developer  . Smokeless tobacco: Not on file  . Alcohol Use: No     Comment: occasional   . Drug Use: No  . Sexually Active: Not on file   Other Topics Concern  . Not on file   Social History Narrative   Occasional  Caffeine drinker      Constitutional: Denies fever, malaise, fatigue, headache or abrupt weight changes.   Cardiovascular: Denies chest pain, chest tightness, palpitations or swelling in the hands or feet.  Gastrointestinal: Pt reports LLQ abdominal pain, diarrhea and nausea. Denies bloating, constipation,  or blood in the stool.  GU: Denies urgency, frequency, pain with urination, burning sensation, blood in urine, odor or discharge. .   No other specific complaints in a complete review of systems (except as listed in  HPI above).  Objective:   Physical Exam   BP 110/72  Pulse 99  Temp(Src) 97.9 F (36.6 C) (Oral)  Ht 5\' 1"  (1.549 m)  Wt 146 lb (66.225 kg)  BMI 27.6 kg/m2  SpO2 94% Wt Readings from Last 3 Encounters:  09/22/12 146 lb (66.225 kg)  09/15/12 147 lb (66.679 kg)  08/04/12 145 lb (65.772 kg)    General: Appears her stated age, well developed, well nourished in NAD. Cardiovascular: Normal rate and rhythm. S1,S2 noted.  No murmur, rubs or gallops noted. No JVD or BLE edema. No carotid bruits noted. Pulmonary/Chest: Normal effort and positive vesicular breath sounds. No respiratory distress. No wheezes, rales or ronchi noted.  Abdomen: Soft and tender to palpation in the LLQ. Normal bowel sounds, no bruits noted. No distention or masses noted. Liver, spleen and kidneys non  palpable.       Assessment & Plan:   LLQ pain, concerning for acute diverticulitis:  Will obtain BMET and CBC  Will obtain CT scan of abdomen to assess for diverticulitis  Will f/u after CT scan results

## 2013-01-18 ENCOUNTER — Ambulatory Visit (INDEPENDENT_AMBULATORY_CARE_PROVIDER_SITE_OTHER): Payer: BC Managed Care – PPO | Admitting: Internal Medicine

## 2013-01-18 ENCOUNTER — Encounter: Payer: Self-pay | Admitting: Internal Medicine

## 2013-01-18 VITALS — BP 120/78 | HR 88 | Temp 99.2°F | Ht 61.0 in | Wt 144.1 lb

## 2013-01-18 DIAGNOSIS — F411 Generalized anxiety disorder: Secondary | ICD-10-CM

## 2013-01-18 DIAGNOSIS — L989 Disorder of the skin and subcutaneous tissue, unspecified: Secondary | ICD-10-CM

## 2013-01-18 DIAGNOSIS — J45909 Unspecified asthma, uncomplicated: Secondary | ICD-10-CM

## 2013-01-18 DIAGNOSIS — J209 Acute bronchitis, unspecified: Secondary | ICD-10-CM | POA: Insufficient documentation

## 2013-01-18 MED ORDER — BENZONATATE 100 MG PO CAPS: ORAL_CAPSULE | ORAL | Status: DC

## 2013-01-18 MED ORDER — AZITHROMYCIN 250 MG PO TABS: ORAL_TABLET | ORAL | Status: DC

## 2013-01-18 NOTE — Assessment & Plan Note (Signed)
Mild worsening situational due to work, delcines need for cousneling or further tx

## 2013-01-18 NOTE — Assessment & Plan Note (Signed)
stable overall by history and exam, recent data reviewed with pt, and pt to continue medical treatment as before,  to f/u any worsening symptoms or concerns SpO2 Readings from Last 3 Encounters:  01/18/13 93%  09/22/12 94%  09/15/12 96%

## 2013-01-18 NOTE — Patient Instructions (Addendum)
Please take all new medication as prescribed - the antibiotic, and cough pills Please continue all other medications as before, and refills have been done if requested. Please self refer to Dermatology as you have planned  Please remember to sign up for My Chart if you have not done so, as this will be important to you in the future with finding out test results, communicating by private email, and scheduling acute appointments online when needed.  Please return in 3 months, or sooner if needed, with Lab testing done 3-5 days before

## 2013-01-18 NOTE — Assessment & Plan Note (Signed)
Likely bening, pt plans to self refer to derm in [place]

## 2013-01-18 NOTE — Assessment & Plan Note (Signed)
No bronchospasm, but with signficant fatigue, for work note, Mild to mod, for antibx course,  to f/u any worsening symptoms or concerns

## 2013-01-18 NOTE — Progress Notes (Signed)
Subjective:    [name], female    DOB: December 02, 1950, 62 y.o.   MRN: [id number]  HPI  Here with acute onset mild to mod 5-6 days ST, HA, general weakness and malaise, with prod cough greenish sputum, but Pt denies chest pain, increased sob or doe, wheezing, orthopnea, PND, increased LE swelling, palpitations, dizziness or syncope. Has missed work since last Friday.  Couldn't sleep last night due to cough.  Denies worsening depressive symptoms, suicidal ideation, or panic; has ongoing stress at work, worse recently. Also with skin lesion left upper chest ? Enlarging. Past Medical History  Diagnosis Date  . ALLERGIC RHINITIS   . [name]   . ANXIETY   . ASTHMA   . DEPRESSION   . HYPERLIPIDEMIA   . COMMON MIGRAINE   . [name]   . DIVERTICULOSIS, COLON   . Mild mitral regurgitation by prior echocardiogram    Past Surgical History  Procedure Laterality Date  . Appendectomy    . Abdominal hysterectomy      reports that she has quit smoking. She does not have any smokeless tobacco history on file. She reports that she does not drink alcohol or use illicit drugs. family history includes Heart disease in her brother and father. There is no history of Colon cancer. Allergies  Allergen Reactions  . Trazodone And Nefazodone    Current Outpatient Prescriptions on File Prior to Visit  Medication Sig Dispense Refill  . amitriptyline (ELAVIL) 50 MG tablet Take 1 tablet (50 mg total) by mouth at bedtime as needed for sleep.  90 tablet  1  . butalbital-acetaminophen-caffeine (FIORICET) 50-325-40 MG per tablet Take 1-2 tablets by mouth 2 (two) times daily as needed for headache.  60 tablet  1  . ciprofloxacin (CIPRO) 500 MG tablet Take 1 tablet (500 mg total) by mouth 2 (two) times daily.  28 tablet  0  . fexofenadine (ALLEGRA) 180 MG tablet Take 1 tablet (180 mg total) by mouth daily as needed.  30 tablet  1  . gabapentin (NEURONTIN) 100 MG capsule  Take 1 capsule (100 mg total) by mouth 3 (three) times daily.  60 capsule  0  . metroNIDAZOLE (FLAGYL) 500 MG tablet Take 1 tablet (500 mg total) by mouth 3 (three) times daily.  21 tablet  0  . simvastatin (ZOCOR) 40 MG tablet Take 1 tablet (40 mg total) by mouth daily.  90 tablet  3  . valACYclovir (VALTREX) 1000 MG tablet Take 1 tablet (1,000 mg total) by mouth 3 (three) times daily.  42 tablet  0  . VESICARE 5 MG tablet Take 5 mg by mouth daily.        No current facility-administered medications on file prior to visit.     Review of Systems  Constitutional: Negative for unexpected weight change, or unusual diaphoresis  HENT: Negative for tinnitus.   Eyes: Negative for photophobia and visual disturbance.  Respiratory: Negative for choking and stridor.   Gastrointestinal: Negative for vomiting and blood in stool.  Genitourinary: Negative for hematuria and decreased urine volume.  Musculoskeletal: Negative for acute joint swelling Skin: Negative for color change and wound.  Neurological: Negative for tremors and numbness other than noted  Psychiatric/Behavioral: Negative for decreased concentration or  hyperactivity.       Objective:   Physical Exam BP 120/78  Pulse 88  Temp(Src) 99.2 F (37.3 C) (Oral)  Ht 5\' 1"  (1.549 m)  Wt 144 lb 2 oz (65.375 kg)  BMI 27.25 kg/m2  SpO2 93% VS noted, mild ill  Constitutional: Pt appears well-developed and well-nourished.  HENT: Head: NCAT.  Right Ear: External ear normal.  Left Ear: External ear normal.  Bilat tm's with mild erythema.  Max sinus areas non tender.  Pharynx with mild erythema, no exudate Eyes: Conjunctivae and EOM are normal. Pupils are equal, round, and reactive to light.  Neck: Normal range of motion. Neck supple. butwith mild tender bilat submandib LA Cardiovascular: Normal rate and regular rhythm.   Pulmonary/Chest: Effort normal and breath sounds normal.  Abd:  Soft, NT, non-distended, + BS Neurological: Pt is  alert. Not confused  Skin: Skin is warm. No erythema. Has raised 1/2 cm tan regular borders nontender lesion right upper chest wall near mid infraclavicular Psychiatric: Pt behavior is normal. Thought content normal.     Assessment & Plan:

## 2013-01-21 ENCOUNTER — Ambulatory Visit (INDEPENDENT_AMBULATORY_CARE_PROVIDER_SITE_OTHER): Payer: BC Managed Care – PPO | Admitting: Internal Medicine

## 2013-01-21 ENCOUNTER — Ambulatory Visit (INDEPENDENT_AMBULATORY_CARE_PROVIDER_SITE_OTHER)
Admission: RE | Admit: 2013-01-21 | Discharge: 2013-01-21 | Disposition: A | Discharge status: Home / Self Care | Payer: BC Managed Care – PPO | Source: Ambulatory Visit | Attending: Internal Medicine | Admitting: Internal Medicine

## 2013-01-21 ENCOUNTER — Encounter: Payer: Self-pay | Admitting: Internal Medicine

## 2013-01-21 VITALS — BP 120/80 | HR 89 | Temp 97.7°F | Ht 61.0 in | Wt 142.5 lb

## 2013-01-21 DIAGNOSIS — J209 Acute bronchitis, unspecified: Secondary | ICD-10-CM

## 2013-01-21 DIAGNOSIS — J45909 Unspecified asthma, uncomplicated: Secondary | ICD-10-CM

## 2013-01-21 DIAGNOSIS — I10 Essential (primary) hypertension: Secondary | ICD-10-CM

## 2013-01-21 MED ORDER — LEVOFLOXACIN 250 MG PO TABS: 250.0000 mg | ORAL_TABLET | Freq: Every day | ORAL | Status: DC

## 2013-01-21 NOTE — Progress Notes (Signed)
Subjective:    [name], female    DOB: Aug 15, 1950, 62 y.o.   MRN: [id number]  HPI  Here to f/u, unfortunately with worsening myalgias, left and mid chest soreness,  Diffuse back soreness, sens of wellbeing.  Cough somewhat improved, still with some elev temp, cough persists but better with medicaiton, and no increased sob or doe, wheezing, orthopnea, PND, increased LE swelling, palpitations, dizziness or syncope.  Has signficant fatigue and some worsening ST.  Frustrated some today as she needs to get back to work but feels she cannot yet. Pt denies new neurological symptoms such as new headache, or facial or extremity weakness or numbness  Past Medical History  Diagnosis Date  . ALLERGIC RHINITIS   . [name]   . ANXIETY   . ASTHMA   . DEPRESSION   . HYPERLIPIDEMIA   . COMMON MIGRAINE   . [name]   . DIVERTICULOSIS, COLON   . Mild mitral regurgitation by prior echocardiogram    Past Surgical History  Procedure Laterality Date  . Appendectomy    . Abdominal hysterectomy      reports that she has quit smoking. She does not have any smokeless tobacco history on file. She reports that she does not drink alcohol or use illicit drugs. family history includes Heart disease in her brother and father. There is no history of Colon cancer. Allergies  Allergen Reactions  . Trazodone And Nefazodone    Current Outpatient Prescriptions on File Prior to Visit  Medication Sig Dispense Refill  . amitriptyline (ELAVIL) 50 MG tablet Take 1 tablet (50 mg total) by mouth at bedtime as needed for sleep.  90 tablet  1  . benzonatate (TESSALON) 100 MG capsule 1-2 tabs by mouth every 6 hrs as needed for cough  60 capsule  0  . butalbital-acetaminophen-caffeine (FIORICET) 50-325-40 MG per tablet Take 1-2 tablets by mouth 2 (two) times daily as needed for headache.  60 tablet  1  . fexofenadine (ALLEGRA) 180 MG tablet Take 1 tablet (180 mg total) by mouth  daily as needed.  30 tablet  1  . gabapentin (NEURONTIN) 100 MG capsule Take 1 capsule (100 mg total) by mouth 3 (three) times daily.  60 capsule  0  . metroNIDAZOLE (FLAGYL) 500 MG tablet Take 1 tablet (500 mg total) by mouth 3 (three) times daily.  21 tablet  0  . simvastatin (ZOCOR) 40 MG tablet Take 1 tablet (40 mg total) by mouth daily.  90 tablet  3  . valACYclovir (VALTREX) 1000 MG tablet Take 1 tablet (1,000 mg total) by mouth 3 (three) times daily.  42 tablet  0  . VESICARE 5 MG tablet Take 5 mg by mouth daily.        No current facility-administered medications on file prior to visit.   Review of Systems  Constitutional: Negative for unexpected weight change, or unusual diaphoresis  HENT: Negative for tinnitus.   Eyes: Negative for photophobia and visual disturbance.  Respiratory: Negative for choking and stridor.   Gastrointestinal: Negative for vomiting and blood in stool.  Genitourinary: Negative for hematuria and decreased urine volume.  Musculoskeletal: Negative for acute joint swelling Skin: Negative for color change and wound.  Neurological: Negative for tremors and numbness other than noted  Psychiatric/Behavioral: Negative for decreased concentration or  hyperactivity.       Objective:   Physical Exam BP 120/80  Pulse 89  Temp(Src) 97.7 F (36.5 C) (Oral)  Ht 5\' 1"  (  1.549 m)  Wt 142 lb 8 oz (64.638 kg)  BMI 26.94 kg/m2  SpO2 94% VS noted, non toxic but fatigued Constitutional: Pt appears well-developed and well-nourished.  HENT: Head: NCAT.  Right Ear: External ear normal.  Left Ear: External ear normal.  Eyes: Conjunctivae and EOM are normal. Pupils are equal, round, and reactive to light.  Neck: Normal range of motion. Neck supple.  Cardiovascular: Normal rate and regular rhythm.   Pulmonary/Chest: Effort normal and breath sounds intact, no rales or wheezing Neurological: Pt is alert. Not confused , motor grossly intact Skin: Skin is warm. No erythema.   Psychiatric: Pt behavior is normal. Thought content normal. mild nerovus    Assessment & Plan:

## 2013-01-21 NOTE — Patient Instructions (Signed)
Ok to stop the zpack (your current antibiotic) Please take all new medication as prescribed - the new antibiotic Please continue all other medications as before, and refills have been done if requested. Please have the pharmacy call with any other refills you may need.  Please go to the XRAY Department in the Basement (go straight as you get off the elevator) for the x-ray testing  You will be contacted by phone if any changes need to be made immediately.  Otherwise, you will receive a letter about your results with an explanation, but please check with MyChart first.  You are given the work note as well

## 2013-01-21 NOTE — Assessment & Plan Note (Signed)
Persistent symptoms, worsening chest and back pain more left side, for cxr, change zpack to levaquin asd,  to f/u any worsening symptoms or concerns

## 2013-01-21 NOTE — Assessment & Plan Note (Signed)
stable overall by history and exam, recent data reviewed with pt, and pt to continue medical treatment as before,  to f/u any worsening symptoms or concerns  SpO2 Readings from Last 3 Encounters:  01/21/13 94%  01/18/13 93%  09/22/12 94%

## 2013-01-21 NOTE — Assessment & Plan Note (Signed)
stable overall by history and exam, recent data reviewed with pt, and pt to continue medical treatment as before,  to f/u any worsening symptoms or concerns BP Readings from Last 3 Encounters:  01/21/13 120/80  01/18/13 120/78  09/22/12 110/72

## 2013-01-22 ENCOUNTER — Encounter: Payer: Self-pay | Admitting: Internal Medicine

## 2013-01-22 ENCOUNTER — Ambulatory Visit (INDEPENDENT_AMBULATORY_CARE_PROVIDER_SITE_OTHER): Payer: BC Managed Care – PPO | Admitting: Internal Medicine

## 2013-01-22 VITALS — BP 118/70 | HR 96 | Temp 98.1°F | Wt 144.0 lb

## 2013-01-22 DIAGNOSIS — M79644 Pain in right finger(s): Secondary | ICD-10-CM

## 2013-01-22 DIAGNOSIS — B9789 Other viral agents as the cause of diseases classified elsewhere: Secondary | ICD-10-CM

## 2013-01-22 DIAGNOSIS — L989 Disorder of the skin and subcutaneous tissue, unspecified: Secondary | ICD-10-CM

## 2013-01-22 DIAGNOSIS — M79609 Pain in unspecified limb: Secondary | ICD-10-CM

## 2013-01-22 DIAGNOSIS — B349 Viral infection, unspecified: Secondary | ICD-10-CM

## 2013-01-22 MED ORDER — VALACYCLOVIR HCL 1 G PO TABS: 1000.0000 mg | ORAL_TABLET | Freq: Three times a day (TID) | ORAL | Status: DC

## 2013-01-22 NOTE — Progress Notes (Signed)
  Subjective:    [name], female    DOB: 1950-08-10, 62 y.o.   MRN: [id number]  HPI  complains of right thumb pain Onset yesterday Describes as burning Associated with tiny vesicles (2) and mild swelling Denies injury or contact exposures/bug bite Similar to prior episodes of shingles, but never on thumb On Levaquin for bronchitis, but denies immunosuppression  Past Medical History  Diagnosis Date  . ALLERGIC RHINITIS   . [name]   . ANXIETY   . ASTHMA   . DEPRESSION   . HYPERLIPIDEMIA   . COMMON MIGRAINE   . [name]   . DIVERTICULOSIS, COLON   . Mild mitral regurgitation by prior echocardiogram     Review of Systems  Constitutional: Negative for fever and fatigue.  Musculoskeletal: Negative for myalgias and joint swelling.  Skin: Negative for wound.       Objective:   Physical Exam  BP 118/70  Pulse 96  Temp(Src) 98.1 F (36.7 C) (Oral)  Wt 144 lb (65.318 kg)  BMI 27.22 kg/m2  SpO2 96% Wt Readings from Last 3 Encounters:  01/22/13 144 lb (65.318 kg)  01/21/13 142 lb 8 oz (64.638 kg)  01/18/13 144 lb 2 oz (65.375 kg)   Gen: nontoxic Skin: 2 tiny vesicles on extensor surface of right thumb, no cellulitis or erythema, no laceration, punctate injury Musculoskeletal: general soft tissue swelling of right thumb, no joint effusion. Full range of motion  Lab Results  Component Value Date   WBC 10.4 09/22/2012   HGB 13.9 09/22/2012   HCT 38.7 09/22/2012   PLT 309.0 09/22/2012   GLUCOSE 92 09/22/2012   CHOL 161 07/21/2012   TRIG 148.0 07/21/2012   HDL 63.60 07/21/2012   LDLDIRECT 137.0 04/06/2010   LDLCALC 68 07/21/2012   ALT 22 07/21/2012   AST 22 07/21/2012   NA 140 09/22/2012   K 4.4 09/22/2012   CL 105 09/22/2012   CREATININE 0.8 09/22/2012   BUN 7 09/22/2012   CO2 28 09/22/2012   TSH 1.89 04/06/2010   INR 1.0 05/10/2008   HGBA1C 5.8 01/25/2009        Assessment & Plan:   Right thumb with pain and  ?vesicles -  ?herpetic whitlow vs HZV - hx latter numerous episodes - last 08/2012 reviewed Denies immunocompromise

## 2013-01-22 NOTE — Patient Instructions (Signed)
It was good to see you today. Valtrex 3x/day for 1 week for viral infection of thumb - Your prescription(s) have been submitted to your pharmacy. Please take as directed and contact our office if you believe you are having problem(s) with the medication(s). Alternate between ibuprofen and tylenol for aches, pain and fever symptoms as discussed Call if worse or unimproved

## 2013-01-26 ENCOUNTER — Other Ambulatory Visit (INDEPENDENT_AMBULATORY_CARE_PROVIDER_SITE_OTHER): Payer: BC Managed Care – PPO

## 2013-01-26 ENCOUNTER — Ambulatory Visit (INDEPENDENT_AMBULATORY_CARE_PROVIDER_SITE_OTHER): Payer: BC Managed Care – PPO | Admitting: Internal Medicine

## 2013-01-26 ENCOUNTER — Encounter: Payer: Self-pay | Admitting: Internal Medicine

## 2013-01-26 ENCOUNTER — Telehealth: Payer: Self-pay | Admitting: *Deleted

## 2013-01-26 VITALS — BP 110/80 | HR 85 | Temp 97.4°F | Wt 145.5 lb

## 2013-01-26 DIAGNOSIS — R002 Palpitations: Secondary | ICD-10-CM

## 2013-01-26 DIAGNOSIS — F411 Generalized anxiety disorder: Secondary | ICD-10-CM

## 2013-01-26 DIAGNOSIS — E785 Hyperlipidemia, unspecified: Secondary | ICD-10-CM

## 2013-01-26 DIAGNOSIS — I10 Essential (primary) hypertension: Secondary | ICD-10-CM

## 2013-01-26 LAB — URINALYSIS
Ketones, ur: NEGATIVE
Specific Gravity, Urine: >=1.03 (ref 1.000–1.030)
Total Protein, Urine: NEGATIVE
Urine Glucose: NEGATIVE
pH: 6 (ref 5.0–8.0)

## 2013-01-26 LAB — BASIC METABOLIC PANEL
BUN: 10 mg/dL (ref 6–23)
CO2: 26 mEq/L (ref 19–32)
GFR: 70.26 mL/min (ref >60.00)
Glucose, Bld: 90 mg/dL (ref 70–99)
Potassium: 4.4 mEq/L (ref 3.5–5.1)
Sodium: 136 mEq/L (ref 135–145)

## 2013-01-26 LAB — CK: Total CK: 103 U/L (ref 7–177)

## 2013-01-26 MED ORDER — ALPRAZOLAM 0.25 MG PO TABS: 0.2500 mg | ORAL_TABLET | Freq: Two times a day (BID) | ORAL | Status: DC | PRN

## 2013-01-26 MED ORDER — METOPROLOL SUCCINATE ER 25 MG PO TB24: 12.5000 mg | ORAL_TABLET | Freq: Every day | ORAL | Status: DC

## 2013-01-26 NOTE — Telephone Encounter (Signed)
This would be VERY unlikely to be from the medication  Please consider going to ER or UC or here for ECG if recurs for a period of time, or has assoc symtpoms such as CP, sob, dizziness, weakness

## 2013-01-26 NOTE — Assessment & Plan Note (Signed)
Xanax prn 

## 2013-01-26 NOTE — Assessment & Plan Note (Addendum)
9/14 recurrent - probable PACs She stopped Levaquin May decrease amitriptyline dose EKG Labs She had a cardiac ECHO

## 2013-01-26 NOTE — Telephone Encounter (Signed)
Pt called states she has began experiencing rapid heart rate while on Acyclovir.  Please advise

## 2013-01-26 NOTE — Assessment & Plan Note (Addendum)
On NAS diet  

## 2013-01-26 NOTE — Progress Notes (Signed)
  Subjective:    [name], female    DOB: 03-15-1951, 62 y.o.   MRN: [id number]  Palpitations  This is a recurrent problem. The current episode started in the past 7 days. The problem occurs 2 to 4 times per day. The problem has been gradually improving. Nothing (?levaquin) aggravates the symptoms. Associated symptoms include chest fullness. Pertinent negatives include no chest pain, coughing, diaphoresis, near-syncope, shortness of breath or weakness. She has tried nothing for the symptoms. The treatment provided no relief.   C/o stress   Review of Systems  Constitutional: Negative for diaphoresis.  HENT: Negative for congestion.   Eyes: Negative for pain.  Respiratory: Negative for cough and shortness of breath.   Cardiovascular: Positive for palpitations. Negative for chest pain, leg swelling and near-syncope.  Gastrointestinal: Negative for abdominal pain and anal bleeding.  Genitourinary: Negative for frequency and flank pain.  Neurological: Negative for weakness.       Objective:   Physical Exam  Constitutional: She appears well-developed. No distress.  HENT:  Head: Normocephalic.  Right Ear: External ear normal.  Left Ear: External ear normal.  Nose: Nose normal.  Mouth/Throat: Oropharynx is clear and moist.  Eyes: Conjunctivae are normal. Pupils are equal, round, and reactive to light. Right eye exhibits no discharge. Left eye exhibits no discharge.  Neck: Normal range of motion. Neck supple. No JVD present. No tracheal deviation present. No thyromegaly present.  Cardiovascular: Normal rate, regular rhythm and normal heart sounds.   irreg beats - rare  Pulmonary/Chest: No stridor. No respiratory distress. She has no wheezes.  Abdominal: Soft. Bowel sounds are normal. She exhibits no distension and no mass. There is no tenderness. There is no rebound and no guarding.  Musculoskeletal: She exhibits no edema and no tenderness.  Lymphadenopathy:    She has no  cervical adenopathy.  Neurological: She displays normal reflexes. No cranial nerve deficit. She exhibits normal muscle tone. Coordination normal.  Skin: No rash noted. No erythema.  Psychiatric: She has a normal mood and affect. Her behavior is normal. Judgment and thought content normal.    Lab Results  Component Value Date   WBC 10.4 09/22/2012   HGB 13.9 09/22/2012   HCT 38.7 09/22/2012   PLT 309.0 09/22/2012   GLUCOSE 92 09/22/2012   CHOL 161 07/21/2012   TRIG 148.0 07/21/2012   HDL 63.60 07/21/2012   LDLDIRECT 137.0 04/06/2010   LDLCALC 68 07/21/2012   ALT 22 07/21/2012   AST 22 07/21/2012   NA 140 09/22/2012   K 4.4 09/22/2012   CL 105 09/22/2012   CREATININE 0.8 09/22/2012   BUN 7 09/22/2012   CO2 28 09/22/2012   TSH 1.89 04/06/2010   INR 1.0 05/10/2008   HGBA1C 5.8 01/25/2009         Assessment & Plan:

## 2013-01-26 NOTE — Telephone Encounter (Signed)
Patient informed of MD instructions and she agreed to schedule appt today .

## 2013-01-26 NOTE — Assessment & Plan Note (Signed)
Continue with current prescription therapy as reflected on the Med list.  

## 2013-01-26 NOTE — Patient Instructions (Addendum)
Call if problem Rest  Cut back on stress

## 2013-01-27 LAB — TROPONIN I: Troponin I: 0.01 ng/mL (ref <0.06)

## 2013-02-04 ENCOUNTER — Encounter: Payer: Self-pay | Admitting: Internal Medicine

## 2013-02-04 ENCOUNTER — Ambulatory Visit (INDEPENDENT_AMBULATORY_CARE_PROVIDER_SITE_OTHER): Payer: BC Managed Care – PPO | Admitting: Internal Medicine

## 2013-02-04 VITALS — BP 102/70 | HR 84 | Temp 99.5°F | Ht 61.0 in | Wt 145.0 lb

## 2013-02-04 DIAGNOSIS — M79644 Pain in right finger(s): Secondary | ICD-10-CM

## 2013-02-04 DIAGNOSIS — R062 Wheezing: Secondary | ICD-10-CM

## 2013-02-04 DIAGNOSIS — M79609 Pain in unspecified limb: Secondary | ICD-10-CM

## 2013-02-04 DIAGNOSIS — J209 Acute bronchitis, unspecified: Secondary | ICD-10-CM

## 2013-02-04 DIAGNOSIS — I10 Essential (primary) hypertension: Secondary | ICD-10-CM

## 2013-02-04 MED ORDER — AZITHROMYCIN 250 MG PO TABS: ORAL_TABLET | ORAL | Status: DC

## 2013-02-04 MED ORDER — DICLOFENAC SODIUM 1 % TD GEL: 2.0000 g | Freq: Four times a day (QID) | TRANSDERMAL | Status: DC

## 2013-02-04 MED ORDER — HYDROCODONE-HOMATROPINE 5-1.5 MG/5ML PO SYRP: 5.0000 mL | ORAL_SOLUTION | Freq: Four times a day (QID) | ORAL | Status: DC | PRN

## 2013-02-04 NOTE — Progress Notes (Signed)
Subjective:    [name], female    DOB: 05-Jan-1951, 62 y.o.   MRN: [id number]  HPI  Here with acute onset mild to mod 2-3 days ST, HA, general weakness and malaise, with prod cough greenish sputum, but Pt denies chest pain, increased sob or doe, wheezing, orthopnea, PND, increased LE swelling, palpitations, dizziness or syncope, except for onset mild wheezing last PM.  Also with pain to distal right thumb with bony and soft tissue with reduced ROM and small click to DIP for several wks, mild. Past Medical History  Diagnosis Date  . ALLERGIC RHINITIS   . [name]   . ANXIETY   . ASTHMA   . DEPRESSION   . HYPERLIPIDEMIA   . COMMON MIGRAINE   . [name]   . DIVERTICULOSIS, COLON   . Mild mitral regurgitation by prior echocardiogram    Past Surgical History  Procedure Laterality Date  . Appendectomy    . Abdominal hysterectomy      reports that she has quit smoking. She does not have any smokeless tobacco history on file. She reports that she does not drink alcohol or use illicit drugs. family history includes Heart disease in her brother and father. There is no history of Colon cancer. Allergies  Allergen Reactions  . Trazodone And Nefazodone    Current Outpatient Prescriptions on File Prior to Visit  Medication Sig Dispense Refill  . ALPRAZolam (XANAX) 0.25 MG tablet Take 1 tablet (0.25 mg total) by mouth 2 (two) times daily as needed for sleep.  30 tablet  1  . amitriptyline (ELAVIL) 50 MG tablet Take 1 tablet (50 mg total) by mouth at bedtime as needed for sleep.  90 tablet  1  . benzonatate (TESSALON) 100 MG capsule 1-2 tabs by mouth every 6 hrs as needed for cough  60 capsule  0  . butalbital-acetaminophen-caffeine (FIORICET) 50-325-40 MG per tablet Take 1-2 tablets by mouth 2 (two) times daily as needed for headache.  60 tablet  1  . fexofenadine (ALLEGRA) 180 MG tablet Take 1 tablet (180 mg total) by mouth daily as needed.  30  tablet  1  . metoprolol succinate (TOPROL XL) 25 MG 24 hr tablet Take 0.5 tablets (12.5 mg total) by mouth daily.  30 tablet  1  . simvastatin (ZOCOR) 40 MG tablet Take 1 tablet (40 mg total) by mouth daily.  90 tablet  3  . VESICARE 5 MG tablet Take 5 mg by mouth daily.        No current facility-administered medications on file prior to visit.   Review of Systems  Constitutional: Negative for unexpected weight change, or unusual diaphoresis  HENT: Negative for tinnitus.   Eyes: Negative for photophobia and visual disturbance.  Respiratory: Negative for choking and stridor.   Gastrointestinal: Negative for vomiting and blood in stool.  Genitourinary: Negative for hematuria and decreased urine volume.  Musculoskeletal: Negative for acute joint swelling Skin: Negative for color change and wound.  Neurological: Negative for tremors and numbness other than noted  Psychiatric/Behavioral: Negative for decreased concentration or  hyperactivity.       Objective:   Physical Exam BP 102/70  Pulse 84  Temp(Src) 99.5 F (37.5 C) (Oral)  Ht 5\' 1"  (1.549 m)  Wt 145 lb (65.772 kg)  BMI 27.41 kg/m2  SpO2 96% VS noted, mild ill Constitutional: Pt appears well-developed and well-nourished.  HENT: Head: NCAT.  Right Ear: External ear normal.  Left Ear: External ear  normal.  Bilat tm's with mild erythema.  Max sinus areas mild tender.  Pharynx with mild erythema, no exudate Eyes: Conjunctivae and EOM are normal. Pupils are equal, round, and reactive to light.  Neck: Normal range of motion. Neck supple.  Cardiovascular: Normal rate and regular rhythm.   Pulmonary/Chest: Effort normal and breath sounds decresed with bialt few wheezes  Right thumb DIP with mild bony and soft tissue swelling, decreased ROM Neurological: Pt is alert. Not confused  Skin: Skin is warm. No erythema.  Psychiatric: Pt behavior is normal. Thought content normal.     Assessment & Plan:

## 2013-02-04 NOTE — Patient Instructions (Signed)
Please take all new medication as prescribed - the antibiotic, cough medicine, and the gel treatement for the thumb Please continue all other medications as before, and refills have been done if requested. Please have the pharmacy call with any other refills you may need.  Please remember to sign up for My Chart if you have not done so, as this will be important to you in the future with finding out test results, communicating by private email, and scheduling acute appointments online when needed.

## 2013-02-05 ENCOUNTER — Telehealth: Payer: Self-pay | Admitting: Internal Medicine

## 2013-02-05 MED ORDER — DICLOFENAC SODIUM 1.5 % TD SOLN: TRANSDERMAL | Status: DC

## 2013-02-05 NOTE — Telephone Encounter (Signed)
Patient informed and is ok to change.

## 2013-02-05 NOTE — Telephone Encounter (Signed)
Received information that volt gel not covered by insurance  Is changed to penssaid soln - done erx  Robin to let pt know

## 2013-02-06 NOTE — Assessment & Plan Note (Signed)
stable overall by history and exam, recent data reviewed with pt, and pt to continue medical treatment as before,  to f/u any worsening symptoms or concerns BP Readings from Last 3 Encounters:  02/04/13 102/70  01/26/13 110/80  01/22/13 118/70

## 2013-02-06 NOTE — Assessment & Plan Note (Signed)
Mild to mod, for predpack asd,  to f/u any worsening symptoms or concerns 

## 2013-02-06 NOTE — Assessment & Plan Note (Signed)
C/w DJD, for voltaren gel prn,  to f/u any worsening symptoms or concerns

## 2013-02-06 NOTE — Assessment & Plan Note (Signed)
Mild to mod, for antibx course,  to f/u any worsening symptoms or concerns 

## 2013-02-16 ENCOUNTER — Ambulatory Visit: Payer: BC Managed Care – PPO | Admitting: Internal Medicine

## 2013-02-18 ENCOUNTER — Encounter: Payer: Self-pay | Admitting: Internal Medicine

## 2013-02-18 ENCOUNTER — Ambulatory Visit (INDEPENDENT_AMBULATORY_CARE_PROVIDER_SITE_OTHER): Payer: BC Managed Care – PPO | Admitting: Internal Medicine

## 2013-02-18 VITALS — BP 132/80 | HR 100 | Temp 97.3°F | Ht 61.0 in | Wt 149.2 lb

## 2013-02-18 DIAGNOSIS — I10 Essential (primary) hypertension: Secondary | ICD-10-CM

## 2013-02-18 DIAGNOSIS — R062 Wheezing: Secondary | ICD-10-CM

## 2013-02-18 DIAGNOSIS — J309 Allergic rhinitis, unspecified: Secondary | ICD-10-CM

## 2013-02-18 DIAGNOSIS — J209 Acute bronchitis, unspecified: Secondary | ICD-10-CM

## 2013-02-18 MED ORDER — METHYLPREDNISOLONE ACETATE 80 MG/ML IJ SUSP
80.0000 mg | Freq: Once | INTRAMUSCULAR | Status: AC
  Administered 2013-02-18: 80 mg via INTRAMUSCULAR

## 2013-02-18 MED ORDER — PREDNISONE 10 MG PO TABS: ORAL_TABLET | ORAL | Status: DC

## 2013-02-18 MED ORDER — LEVOFLOXACIN 250 MG PO TABS: 250.0000 mg | ORAL_TABLET | Freq: Every day | ORAL | Status: DC

## 2013-02-18 NOTE — Progress Notes (Signed)
Subjective:    [name], female    DOB: 1951-01-27, 62 y.o.   MRN: [id number]  HPI  Here to f/u,  Here with 2-3 days acute onset fever, facial pain, pressure, headache, general weakness and malaise, and greenish d/c, with mild ST and cough, but pt denies chest pain, wheezing, increased sob or doe, orthopnea, PND, increased LE swelling, palpitations, dizziness or syncope. Was Better after recent tx, then worse again.  Does have several wks ongoing nasal allergy symptoms with clearish congestion, itch and sneezing, without fever, pain, ST, cough, swelling or wheezing. Past Medical History  Diagnosis Date  . ALLERGIC RHINITIS   . [name]   . ANXIETY   . ASTHMA   . DEPRESSION   . HYPERLIPIDEMIA   . COMMON MIGRAINE   . [name]   . DIVERTICULOSIS, COLON   . Mild mitral regurgitation by prior echocardiogram    Past Surgical History  Procedure Laterality Date  . Appendectomy    . Abdominal hysterectomy      reports that she has quit smoking. She does not have any smokeless tobacco history on file. She reports that she does not drink alcohol or use illicit drugs. family history includes Heart disease in her brother and father. There is no history of Colon cancer. Allergies  Allergen Reactions  . Trazodone And Nefazodone    Current Outpatient Prescriptions on File Prior to Visit  Medication Sig Dispense Refill  . ALPRAZolam (XANAX) 0.25 MG tablet Take 1 tablet (0.25 mg total) by mouth 2 (two) times daily as needed for sleep.  30 tablet  1  . amitriptyline (ELAVIL) 50 MG tablet Take 1 tablet (50 mg total) by mouth at bedtime as needed for sleep.  90 tablet  1  . butalbital-acetaminophen-caffeine (FIORICET) 50-325-40 MG per tablet Take 1-2 tablets by mouth 2 (two) times daily as needed for headache.  60 tablet  1  . Diclofenac Sodium 1.5 % SOLN Use up to 40 drops per site four times per day as needed for pain  150 mL  5  . fexofenadine  (ALLEGRA) 180 MG tablet Take 1 tablet (180 mg total) by mouth daily as needed.  30 tablet  1  . metoprolol succinate (TOPROL XL) 25 MG 24 hr tablet Take 0.5 tablets (12.5 mg total) by mouth daily.  30 tablet  1  . simvastatin (ZOCOR) 40 MG tablet Take 1 tablet (40 mg total) by mouth daily.  90 tablet  3  . VESICARE 5 MG tablet Take 5 mg by mouth daily.        No current facility-administered medications on file prior to visit.   Review of Systems  Constitutional: Negative for unexpected weight change, or unusual diaphoresis  HENT: Negative for tinnitus.   Eyes: Negative for photophobia and visual disturbance.  Respiratory: Negative for choking and stridor.   Gastrointestinal: Negative for vomiting and blood in stool.  Genitourinary: Negative for hematuria and decreased urine volume.  Musculoskeletal: Negative for acute joint swelling Skin: Negative for color change and wound.  Neurological: Negative for tremors and numbness other than noted  Psychiatric/Behavioral: Negative for decreased concentration or  hyperactivity.       Objective:   Physical Exam BP 132/80  Pulse 100  Temp(Src) 97.3 F (36.3 C) (Oral)  Ht 5\' 1"  (1.549 m)  Wt 149 lb 4 oz (67.699 kg)  BMI 28.21 kg/m2  SpO2 96% VS noted,  Constitutional: Pt appears well-developed and well-nourished.  HENT: Head: NCAT.  Right Ear: External ear normal.  Left Ear: External ear normal.  Bilat tm's with mild erythema.  Max sinus areas mild tender.  Pharynx with mild erythema, no exudate Eyes: Conjunctivae and EOM are normal. Pupils are equal, round, and reactive to light.  Neck: Normal range of motion. Neck supple.  Cardiovascular: Normal rate and regular rhythm.   Pulmonary/Chest: Effort normal and breath sounds decr with few bilat wheezing.  Neurological: Pt is alert. Not confused  Skin: Skin is warm. No erythema.  Psychiatric: Pt behavior is normal. Thought content normal.     Assessment & Plan:

## 2013-02-18 NOTE — Patient Instructions (Signed)
You had the steroid shot today Please take all new medication as prescribed - the antibiotic, prednisone Please call if you need the refill on the cough medicine Please continue all other medications as before, including the allegra You are given the work note today  Please remember to sign up for My Chart if you have not done so, as this will be important to you in the future with finding out test results, communicating by private email, and scheduling acute appointments online when needed.

## 2013-02-19 ENCOUNTER — Telehealth: Payer: Self-pay

## 2013-02-19 NOTE — Telephone Encounter (Signed)
PA for Pennsaid has been approved from [facility] from 01/19/13 through 02/18/14.  Patient and pharmacy will be informed

## 2013-02-21 NOTE — Assessment & Plan Note (Signed)
Also to improve with steroid tx, cont allegra prn

## 2013-02-21 NOTE — Assessment & Plan Note (Signed)
stable overall by history and exam, recent data reviewed with pt, and pt to continue medical treatment as before,  to f/u any worsening symptoms or concerns BP Readings from Last 3 Encounters:  02/18/13 132/80  02/04/13 102/70  01/26/13 110/80

## 2013-02-21 NOTE — Assessment & Plan Note (Signed)
Mild to mod, for steroid tx,  to f/u any worsening symptoms or concerns

## 2013-02-21 NOTE — Assessment & Plan Note (Signed)
Mild to mod, for antibx course,  to f/u any worsening symptoms or concerns 

## 2013-02-22 ENCOUNTER — Telehealth: Payer: Self-pay | Admitting: *Deleted

## 2013-02-22 NOTE — Telephone Encounter (Signed)
Please reassure patient: no harm done - No additional prednisone as needed Call if bronchitis symptoms worse or unimproved in next 72 hours

## 2013-02-22 NOTE — Telephone Encounter (Signed)
Pt called states she missread the instructions on Prednisone Rx.  States she took 3 tables TID instead of 3 tablets for 3 days.  Please advise in Dr Melvyn Novas absence

## 2013-02-22 NOTE — Telephone Encounter (Signed)
Spoke with pt advised of MDs message 

## 2013-03-01 ENCOUNTER — Other Ambulatory Visit (INDEPENDENT_AMBULATORY_CARE_PROVIDER_SITE_OTHER): Payer: BC Managed Care – PPO

## 2013-03-01 ENCOUNTER — Encounter: Payer: Self-pay | Admitting: Internal Medicine

## 2013-03-01 ENCOUNTER — Ambulatory Visit (INDEPENDENT_AMBULATORY_CARE_PROVIDER_SITE_OTHER): Payer: BC Managed Care – PPO | Admitting: Internal Medicine

## 2013-03-01 VITALS — BP 122/82 | HR 94 | Temp 97.3°F | Resp 16 | Wt 147.0 lb

## 2013-03-01 DIAGNOSIS — R5381 Other malaise: Secondary | ICD-10-CM

## 2013-03-01 DIAGNOSIS — R209 Unspecified disturbances of skin sensation: Secondary | ICD-10-CM

## 2013-03-01 DIAGNOSIS — R202 Paresthesia of skin: Secondary | ICD-10-CM

## 2013-03-01 DIAGNOSIS — J209 Acute bronchitis, unspecified: Secondary | ICD-10-CM

## 2013-03-01 LAB — HEPATIC FUNCTION PANEL
Alkaline Phosphatase: 68 U/L (ref 39–117)
Bilirubin, Direct: 0 mg/dL (ref 0.0–0.3)

## 2013-03-01 LAB — CBC WITH DIFFERENTIAL/PLATELET
Basophils Absolute: 0 10*3/uL (ref 0.0–0.1)
Basophils Relative: 0.3 % (ref 0.0–3.0)
Eosinophils Absolute: 0.2 10*3/uL (ref 0.0–0.7)
Lymphocytes Relative: 28.7 % (ref 12.0–46.0)
MCHC: 33.7 g/dL (ref 30.0–36.0)
MCV: 88.8 fl (ref 78.0–100.0)
Monocytes Absolute: 1 10*3/uL (ref 0.1–1.0)
Monocytes Relative: 7.1 % (ref 3.0–12.0)
Neutrophils Relative %: 62.2 % (ref 43.0–77.0)
Platelets: 362 10*3/uL (ref 150.0–400.0)
RBC: 4.99 Mil/uL (ref 3.87–5.11)
WBC: 14.2 10*3/uL — ABNORMAL HIGH (ref 4.5–10.5)

## 2013-03-01 LAB — VITAMIN B12: Vitamin B-12: 421 pg/mL (ref 211–911)

## 2013-03-01 LAB — URINALYSIS
Bilirubin Urine: NEGATIVE
Leukocytes, UA: NEGATIVE
Specific Gravity, Urine: >=1.03 (ref 1.000–1.030)
Total Protein, Urine: NEGATIVE
Urine Glucose: NEGATIVE

## 2013-03-01 LAB — BASIC METABOLIC PANEL
BUN: 12 mg/dL (ref 6–23)
Chloride: 101 mEq/L (ref 96–112)
Potassium: 4.4 mEq/L (ref 3.5–5.1)
Sodium: 137 mEq/L (ref 135–145)

## 2013-03-01 LAB — SEDIMENTATION RATE: Sed Rate: 16 mm/hr (ref 0–22)

## 2013-03-01 LAB — TSH: TSH: 2.41 u[IU]/mL (ref 0.35–5.50)

## 2013-03-01 LAB — CK: Total CK: 47 U/L (ref 7–177)

## 2013-03-01 NOTE — Patient Instructions (Signed)
Hold Simvastatin  Gluten free trial (no wheat products) for 4-6 weeks. OK to use gluten-free bread and gluten-free pasta.  Milk free trial (no milk, ice cream, cheese and yogurt) for 4-6 weeks. OK to use almond, coconut, rice or soy milk. "Almond breeze" brand tastes good.

## 2013-03-01 NOTE — Assessment & Plan Note (Signed)
Better  

## 2013-03-01 NOTE — Progress Notes (Signed)
  Subjective:    [name], female    DOB: 1951-02-09, 62 y.o.   MRN: [id number]  HPI  C/o tingling in hands and feet x 1 week. Pt just finished Prednisone and Levaquin last Thursday.    Review of Systems  Constitutional: Positive for fever and fatigue. Negative for chills, diaphoresis, activity change, appetite change and unexpected weight change.  HENT: Positive for congestion. Negative for ear pain, nosebleeds, facial swelling, rhinorrhea, sneezing, mouth sores, trouble swallowing, neck stiffness, postnasal drip, sinus pressure and tinnitus.   Eyes: Negative for pain, discharge, redness, itching and visual disturbance.  Respiratory: Negative for cough, chest tightness, shortness of breath, wheezing and stridor.   Cardiovascular: Negative for chest pain, palpitations and leg swelling.  Gastrointestinal: Negative for nausea, diarrhea, constipation, blood in stool, abdominal distention, anal bleeding and rectal pain.  Genitourinary: Negative for dysuria, urgency, frequency, hematuria, flank pain, vaginal bleeding, vaginal discharge, difficulty urinating, genital sores and pelvic pain.  Musculoskeletal: Negative for back pain, joint swelling, arthralgias and gait problem.  Skin: Negative.  Negative for rash.  Neurological: Positive for numbness. Negative for dizziness, tremors, seizures, syncope, speech difficulty, weakness and headaches.  Hematological: Negative for adenopathy. Does not bruise/bleed easily.  Psychiatric/Behavioral: Negative for suicidal ideas, behavioral problems, sleep disturbance, dysphoric mood and decreased concentration. The patient is not nervous/anxious.        Objective:   Physical Exam  Constitutional: She appears well-developed. No distress.  HENT:  Head: Normocephalic.  Right Ear: External ear normal.  Left Ear: External ear normal.  Nose: Nose normal.  Mouth/Throat: Oropharynx is clear and moist.  Eyes: Conjunctivae are normal. Pupils are  equal, round, and reactive to light. Right eye exhibits no discharge. Left eye exhibits no discharge.  Neck: Normal range of motion. Neck supple. No JVD present. No tracheal deviation present. No thyromegaly present.  Cardiovascular: Normal rate, regular rhythm and normal heart sounds.   Pulmonary/Chest: No stridor. No respiratory distress. She has no wheezes.  Abdominal: Soft. Bowel sounds are normal. She exhibits no distension and no mass. There is no tenderness. There is no rebound and no guarding.  Musculoskeletal: She exhibits no edema and no tenderness.  Lymphadenopathy:    She has no cervical adenopathy.  Neurological: She displays normal reflexes. No cranial nerve deficit. She exhibits normal muscle tone. Coordination normal.  Skin: No rash noted. No erythema.  Psychiatric: She has a normal mood and affect. Her behavior is normal. Judgment and thought content normal.          Assessment & Plan:

## 2013-03-01 NOTE — Assessment & Plan Note (Signed)
10/14  x1.5 months Labs Denies OSA

## 2013-03-01 NOTE — Assessment & Plan Note (Signed)
10/14 h/o B12 def B12 level, ESR, CBC, BMET

## 2013-03-02 LAB — VITAMIN D 25 HYDROXY (VIT D DEFICIENCY, FRACTURES): Vit D, 25-Hydroxy: 54 ng/mL (ref 30–89)

## 2013-06-25 ENCOUNTER — Other Ambulatory Visit: Payer: Self-pay | Admitting: Internal Medicine

## 2013-06-25 NOTE — Telephone Encounter (Signed)
Done erx 

## 2013-07-06 ENCOUNTER — Ambulatory Visit (INDEPENDENT_AMBULATORY_CARE_PROVIDER_SITE_OTHER): Payer: BC Managed Care – PPO | Admitting: Internal Medicine

## 2013-07-06 ENCOUNTER — Encounter: Payer: Self-pay | Admitting: Internal Medicine

## 2013-07-06 VITALS — BP 120/60 | HR 92 | Temp 98.0°F | Ht 60.0 in | Wt 145.0 lb

## 2013-07-06 DIAGNOSIS — I10 Essential (primary) hypertension: Secondary | ICD-10-CM

## 2013-07-06 DIAGNOSIS — J209 Acute bronchitis, unspecified: Secondary | ICD-10-CM

## 2013-07-06 DIAGNOSIS — J45909 Unspecified asthma, uncomplicated: Secondary | ICD-10-CM

## 2013-07-06 MED ORDER — HYDROCODONE-HOMATROPINE 5-1.5 MG/5ML PO SYRP: 5.0000 mL | ORAL_SOLUTION | Freq: Four times a day (QID) | ORAL | Status: DC | PRN

## 2013-07-06 MED ORDER — LEVOFLOXACIN 250 MG PO TABS: 250.0000 mg | ORAL_TABLET | Freq: Every day | ORAL | Status: DC

## 2013-07-06 NOTE — Patient Instructions (Signed)
Please take all new medication as prescribed - the antibiotic, and cough medicine  Please continue all other medications as before, and refills have been done if requested.  Please have the pharmacy call with any other refills you may need.       

## 2013-07-06 NOTE — Progress Notes (Signed)
   Subjective:    Patient ID: Samantha SauceKaren D Hornstein, female    DOB: Jan 18, 1951, 63 y.o.   MRN: [id number]  HPI  Here with acute onset mild to mod 2-3 days ST, HA, general weakness and malaise, with prod cough greenish sputum, but Pt denies chest pain, increased sob or doe, wheezing, orthopnea, PND, increased LE swelling, palpitations, dizziness or syncope.  Tx recently with zpack, better, then worse again.  No having to use inhalers increased.  Pt denies new neurological symptoms such as new headache, or facial or extremity weakness or numbness   Pt denies polydipsia, polyuria Past Medical History  Diagnosis Date  . ALLERGIC RHINITIS   . [name]   . ANXIETY   . ASTHMA   . DEPRESSION   . HYPERLIPIDEMIA   . COMMON MIGRAINE   . [name]   . DIVERTICULOSIS, COLON   . Mild mitral regurgitation by prior echocardiogram    Past Surgical History  Procedure Laterality Date  . Appendectomy    . Abdominal hysterectomy      reports that she has quit smoking. She does not have any smokeless tobacco history on file. She reports that she does not drink alcohol or use illicit drugs. family history includes Heart disease in her brother and father. There is no history of Colon cancer. Allergies  Allergen Reactions  . Trazodone And Nefazodone    Current Outpatient Prescriptions on File Prior to Visit  Medication Sig Dispense Refill  . VESICARE 5 MG tablet Take 5 mg by mouth daily.       Marland Kitchen. amitriptyline (ELAVIL) 50 MG tablet TAKE 1 TABLET AT BEDTIME AS NEEDED FOR SLEEP.  90 tablet  1  . fexofenadine (ALLEGRA) 180 MG tablet Take 1 tablet (180 mg total) by mouth daily as needed.  30 tablet  1   No current facility-administered medications on file prior to visit.   Review of Systems  Constitutional: Negative for unexpected weight change, or unusual diaphoresis  HENT: Negative for tinnitus.   Eyes: Negative for photophobia and visual disturbance.  Respiratory: Negative for  choking and stridor.   Gastrointestinal: Negative for vomiting and blood in stool.  Genitourinary: Negative for hematuria and decreased urine volume.  Musculoskeletal: Negative for acute joint swelling Skin: Negative for color change and wound.  Neurological: Negative for tremors and numbness other than noted  Psychiatric/Behavioral: Negative for decreased concentration or  hyperactivity.       Objective:   Physical Exam BP 120/60  Pulse 92  Temp(Src) 98 F (36.7 C) (Oral)  Ht 5' (1.524 m)  Wt 145 lb (65.772 kg)  BMI 28.32 kg/m2  SpO2 97% VS noted, mild ill Constitutional: Pt appears well-developed and well-nourished.  HENT: Head: NCAT.  Right Ear: External ear normal.  Left Ear: External ear normal.  Bilat tm's with mild erythema.  Max sinus areas mild tender.  Pharynx with non erythema, no exudate Eyes: Conjunctivae and EOM are normal. Pupils are equal, round, and reactive to light.  Neck: Normal range of motion. Neck supple.  Cardiovascular: Normal rate and regular rhythm.   Pulmonary/Chest: Effort normal and breath sounds normal.  - no rales or wheeze Neurological: Pt is alert. Not confused  Skin: Skin is warm. No erythema.  Psychiatric: Pt behavior is normal. Thought content normal.     Assessment & Plan:

## 2013-07-06 NOTE — Progress Notes (Signed)
Pre-visit discussion using our clinic review tool. No additional management support is needed unless otherwise documented below in the visit note.  

## 2013-07-09 NOTE — Assessment & Plan Note (Signed)
stable overall by history and exam, recent data reviewed with pt, and pt to continue medical treatment as before,  to f/u any worsening symptoms or concerns BP Readings from Last 3 Encounters:  07/06/13 120/60  03/01/13 122/82  02/18/13 132/80

## 2013-07-09 NOTE — Assessment & Plan Note (Signed)
stable overall by history and exam, recent data reviewed with pt, and pt to continue medical treatment as before,  to f/u any worsening symptoms or concerns\ SpO2 Readings from Last 3 Encounters:  07/06/13 97%  02/18/13 96%  02/04/13 96%

## 2013-07-09 NOTE — Assessment & Plan Note (Signed)
Mild to mod, for antibx course,  to f/u any worsening symptoms or concerns 

## 2013-07-12 ENCOUNTER — Ambulatory Visit (INDEPENDENT_AMBULATORY_CARE_PROVIDER_SITE_OTHER)
Admission: RE | Admit: 2013-07-12 | Discharge: 2013-07-12 | Disposition: A | Discharge status: Home / Self Care | Payer: BC Managed Care – PPO | Source: Ambulatory Visit | Attending: Internal Medicine | Admitting: Internal Medicine

## 2013-07-12 ENCOUNTER — Telehealth: Payer: Self-pay | Admitting: Internal Medicine

## 2013-07-12 ENCOUNTER — Ambulatory Visit (INDEPENDENT_AMBULATORY_CARE_PROVIDER_SITE_OTHER): Payer: BC Managed Care – PPO | Admitting: Internal Medicine

## 2013-07-12 ENCOUNTER — Other Ambulatory Visit (INDEPENDENT_AMBULATORY_CARE_PROVIDER_SITE_OTHER): Payer: BC Managed Care – PPO

## 2013-07-12 ENCOUNTER — Encounter: Payer: Self-pay | Admitting: Internal Medicine

## 2013-07-12 VITALS — BP 112/78 | HR 81 | Temp 97.0°F | Wt 143.0 lb

## 2013-07-12 DIAGNOSIS — R059 Cough, unspecified: Secondary | ICD-10-CM

## 2013-07-12 DIAGNOSIS — R05 Cough: Secondary | ICD-10-CM

## 2013-07-12 DIAGNOSIS — R202 Paresthesia of skin: Secondary | ICD-10-CM

## 2013-07-12 DIAGNOSIS — R209 Unspecified disturbances of skin sensation: Secondary | ICD-10-CM

## 2013-07-12 LAB — CBC WITH DIFFERENTIAL/PLATELET
BASOS PCT: 1.7 % (ref 0.0–3.0)
Basophils Absolute: 0.1 10*3/uL (ref 0.0–0.1)
Eosinophils Absolute: 0.2 10*3/uL (ref 0.0–0.7)
Eosinophils Relative: 3 % (ref 0.0–5.0)
HEMATOCRIT: 42.7 % (ref 36.0–46.0)
Hemoglobin: 14.2 g/dL (ref 12.0–15.0)
LYMPHS ABS: 2 10*3/uL (ref 0.7–4.0)
Lymphocytes Relative: 32.9 % (ref 12.0–46.0)
MCHC: 33.3 g/dL (ref 30.0–36.0)
MCV: 88.1 fl (ref 78.0–100.0)
MONO ABS: 0.4 10*3/uL (ref 0.1–1.0)
Monocytes Relative: 6.3 % (ref 3.0–12.0)
Neutro Abs: 3.5 10*3/uL (ref 1.4–7.7)
Neutrophils Relative %: 56.1 % (ref 43.0–77.0)
PLATELETS: 352 10*3/uL (ref 150.0–400.0)
RBC: 4.85 Mil/uL (ref 3.87–5.11)
RDW: 13.1 % (ref 11.5–14.6)
WBC: 6.2 10*3/uL (ref 4.5–10.5)

## 2013-07-12 MED ORDER — BENZONATATE 200 MG PO CAPS: 200.0000 mg | ORAL_CAPSULE | Freq: Three times a day (TID) | ORAL | Status: DC | PRN

## 2013-07-12 NOTE — Progress Notes (Signed)
Pre-visit discussion using our clinic review tool. No additional management support is needed unless otherwise documented below in the visit note.  

## 2013-07-12 NOTE — Patient Instructions (Addendum)
It was good to see you today.  We have reviewed your prior records including labs and tests today  Test(s) ordered today -labs and xray. Your results will be released to MyChart (or called to you) after review, usually within 72hours after test completion. If any changes need to be made, you will be notified at that same time.  Medications reviewed and updated Stop Hycodan, begin Tessalon as needed for cough No other changes recommended at this time.  Your prescription(s) have been submitted to your pharmacy. Please take as directed and contact our office if you believe you are having problem(s) with the medication(s).  Please plan followup with Dr. Jonny RuizJohn if symptoms unimproved in next 7-10 days, sooner if worse  Cough, Adult  A cough is a reflex that helps clear your throat and airways. It can help heal the body or may be a reaction to an irritated airway. A cough may only last 2 or 3 weeks (acute) or may last more than 8 weeks (chronic).  CAUSES Acute cough:  Viral or bacterial infections. Chronic cough:  Infections.  Allergies.  Asthma.  Post-nasal drip.  Smoking.  Heartburn or acid reflux.  Some medicines.  Chronic lung problems (COPD).  Cancer. SYMPTOMS   Cough.  Fever.  Chest pain.  Increased breathing rate.  High-pitched whistling sound when breathing (wheezing).  Colored mucus that you cough up (sputum). TREATMENT   A bacterial cough may be treated with antibiotic medicine.  A viral cough must run its course and will not respond to antibiotics.  Your caregiver may recommend other treatments if you have a chronic cough. HOME CARE INSTRUCTIONS   Only take over-the-counter or prescription medicines for pain, discomfort, or fever as directed by your caregiver. Use cough suppressants only as directed by your caregiver.  Use a cold steam vaporizer or humidifier in your bedroom or home to help loosen secretions.  Sleep in a semi-upright position if  your cough is worse at night.  Rest as needed.  Stop smoking if you smoke. SEEK IMMEDIATE MEDICAL CARE IF:   You have pus in your sputum.  Your cough starts to worsen.  You cannot control your cough with suppressants and are losing sleep.  You begin coughing up blood.  You have difficulty breathing.  You develop pain which is getting worse or is uncontrolled with medicine.  You have a fever. MAKE SURE YOU:   Understand these instructions.  Will watch your condition.  Will get help right away if you are not doing well or get worse. Document Released: 11/09/2010 Document Revised: 08/05/2011 Document Reviewed: 11/09/2010 [facility] Patient Information 2014 [place].

## 2013-07-12 NOTE — Assessment & Plan Note (Signed)
Hx same - ?med side effects exacerbating same in past 5 days Stop hycodan - start tessalon prn - erx done Recheck CBC today due to new cough follow up PCP if unimproved Lab Results  Component Value Date   VITAMINB12 421 03/01/2013   Lab Results  Component Value Date   ESRSEDRATE 16 03/01/2013   Lab Results  Component Value Date   TSH 2.41 03/01/2013

## 2013-07-12 NOTE — Telephone Encounter (Signed)
Patient Information:  Caller Name: [name]  Phone: [phone]  Patient: [name], [name]  Gender: Female  DOB: [birth year]  Age: 63 Years  PCP: Oliver BarreJohn, James (Adults only)  Office Follow Up:  Does the office need to follow up with this patient?: Yes  Instructions For The Office: Please call her back if MD would prefer she be seen in ER for testing. Called office to speak with triage to see what testing can be done in the office. Xray open until 12 noon.  RN Note:  Refusing to call 911 or go to ED.  Symptoms  Reason For Call & Symptoms: Woke up at 0300- Had waves of numbness and tingling over whole body.  She felt like heart was racing. She got out of bed - no dizziness. She has had Bronchitis for past month and was treated with Zpack and started Levofaxicin on 07/07/13 and still has cough and some Shortness of Breath. She has had pain around her heart for past month. Pain is worse at times with deep breath but not always. No energy and can hardly walk upstairs. Heart racing ramdomly at times with some associated waves of numbness - shoulder have been hurting/not sure how long.  ED advised dt family hx of cardiac issues and age but refulsed. Has appointment at 10:30 in the office.  Reviewed Health History In EMR: Yes  Reviewed Medications In EMR: Yes  Reviewed Allergies In EMR: Yes  Reviewed Surgeries / Procedures: Yes  Date of Onset of Symptoms: 06/15/2013  Treatments Tried: Hycodan- cough med- mostly at night  Treatments Tried Worked: No  Guideline(s) Used:  Chest Pain  Disposition Per Guideline:   Call EMS 911 Now  Reason For Disposition Reached:   Chest pain lasting longer than 5 minutes and ANY of the following:  Over 62 years old Over 62 years old and at least one cardiac risk factor (i.e., high blood pressure, diabetes, high cholesterol, obesity, smoker or strong family history of heart disease) Pain is crushing, pressure-like, or heavy  Took nitroglycerin and chest pain was not  relieved History of heart disease (i.e., angina, heart attack, bypass surgery, angioplasty, CHF)  Advice Given:  N/A  Patient Refused Recommendation:  Patient Will Follow Up With Office Later  Has OV scheduled for 10:30- does not want to go to ER

## 2013-07-12 NOTE — Progress Notes (Signed)
   Subjective:    [name], female    DOB: 12/31/1950, 63 y.o.   MRN: [id number]  HPI  See CC - Seen last week for bronchitis - rx Levaquin and hycodan  Also reviewed chronic medical issues and interval medical events  Past Medical History  Diagnosis Date  . ALLERGIC RHINITIS   . [name]   . ANXIETY   . ASTHMA   . DEPRESSION   . HYPERLIPIDEMIA   . COMMON MIGRAINE   . [name]   . DIVERTICULOSIS, COLON   . Mild mitral regurgitation by prior echocardiogram     Review of Systems  Constitutional: Positive for fatigue. Negative for fever and unexpected weight change.  Respiratory: Positive for cough (x 7 days, OV for same last week reviewed) and chest tightness. Negative for shortness of breath and wheezing.   Cardiovascular: Negative for palpitations and leg swelling.  Neurological: Negative for seizures, facial asymmetry, speech difficulty and headaches.       Nonspecific "wave" from head to toes, Symmetric but denies specific tingling, numbness, weakness -last seconds to minutes, no specific precipitating trigger identified       Objective:   Physical Exam  There were no vitals taken for this visit. Wt Readings from Last 3 Encounters:  07/06/13 145 lb (65.772 kg)  03/01/13 147 lb (66.679 kg)  02/18/13 149 lb 4 oz (67.699 kg)    Constitutional: She appears well-developed and well-nourished. No distress.  Neck: Normal range of motion. Neck supple. No JVD present. No thyromegaly present.  Cardiovascular: Normal rate, regular rhythm and normal heart sounds.  No murmur heard. No BLE edema. Pulmonary/Chest: Effort normal and breath sounds normal. No respiratory distress. She has no wheezes.  Psychiatric: She has a normal mood and affect. Her behavior is normal. Judgment and thought content normal.   Lab Results  Component Value Date   WBC 14.2* 03/01/2013   HGB 14.9 03/01/2013   HCT 44.3 03/01/2013   PLT 362.0 03/01/2013     GLUCOSE 93 03/01/2013   CHOL 161 07/21/2012   TRIG 148.0 07/21/2012   HDL 63.60 07/21/2012   LDLDIRECT 137.0 04/06/2010   LDLCALC 68 07/21/2012   ALT 21 03/01/2013   AST 16 03/01/2013   NA 137 03/01/2013   K 4.4 03/01/2013   CL 101 03/01/2013   CREATININE 0.9 03/01/2013   BUN 12 03/01/2013   CO2 31 03/01/2013   TSH 2.41 03/01/2013   INR 1.0 05/10/2008   HGBA1C 5.8 01/25/2009    Dg Chest 2 View  01/21/2013   *RADIOLOGY REPORT*  Clinical Data: Cough, shortness of breath, left chest pain  CHEST - 2 VIEW  Comparison: 04/16/2012  Findings: Cardiomediastinal silhouette is stable.  No acute infiltrate or pleural effusion.  No pulmonary edema.  Bony thorax is unremarkable.  IMPRESSION: No active disease.   Original Report Authenticated By: Natasha MeadLiviu Pop, M.D.       Assessment & Plan:   Cough -recent treatment for bronchitis reviewed Paresthesias, nonspecific, history of same  Check chest x-ray and CBC Reviewed prior/recent cardiac workup including negative stress echocardiogram December 2013 and nonspecific ECG changes September 2014  If persisting symptoms without clarification of etiology, advise follow up PCP to arrange further ischemic stress testing as needed  Also suspect possible side effect of Hycodan syrup, change syrup to Tessalon as needed for cough symptoms. Continue antibiotics as prescribed

## 2013-07-27 ENCOUNTER — Telehealth: Payer: Self-pay

## 2013-07-27 MED ORDER — BUTALBITAL-APAP-CAFFEINE 50-325-40 MG PO TABS: 1.0000 | ORAL_TABLET | Freq: Two times a day (BID) | ORAL | Status: DC | PRN

## 2013-07-27 NOTE — Telephone Encounter (Signed)
Patient request refill of Fioricet 50-325-40 mg not on current list, advise please [facility] [place])

## 2013-07-27 NOTE — Telephone Encounter (Signed)
Faxed hardcopy to [place] 

## 2013-07-27 NOTE — Telephone Encounter (Signed)
Done hardcopy to robin  

## 2013-08-05 ENCOUNTER — Ambulatory Visit (INDEPENDENT_AMBULATORY_CARE_PROVIDER_SITE_OTHER): Payer: BC Managed Care – PPO | Admitting: Internal Medicine

## 2013-08-05 ENCOUNTER — Encounter: Payer: Self-pay | Admitting: Internal Medicine

## 2013-08-05 VITALS — BP 110/70 | HR 100 | Temp 100.7°F | Resp 15 | Wt 144.0 lb

## 2013-08-05 DIAGNOSIS — J310 Chronic rhinitis: Secondary | ICD-10-CM

## 2013-08-05 DIAGNOSIS — R509 Fever, unspecified: Secondary | ICD-10-CM

## 2013-08-05 DIAGNOSIS — R059 Cough, unspecified: Secondary | ICD-10-CM

## 2013-08-05 DIAGNOSIS — R05 Cough: Secondary | ICD-10-CM

## 2013-08-05 DIAGNOSIS — J45909 Unspecified asthma, uncomplicated: Secondary | ICD-10-CM

## 2013-08-05 MED ORDER — BUDESONIDE-FORMOTEROL FUMARATE 160-4.5 MCG/ACT IN AERO: 2.0000 | INHALATION_SPRAY | Freq: Two times a day (BID) | RESPIRATORY_TRACT | Status: DC

## 2013-08-05 MED ORDER — PREDNISONE 20 MG PO TABS: 20.0000 mg | ORAL_TABLET | Freq: Two times a day (BID) | ORAL | Status: DC

## 2013-08-05 MED ORDER — AZITHROMYCIN 250 MG PO TABS: ORAL_TABLET | ORAL | Status: DC

## 2013-08-05 NOTE — Patient Instructions (Signed)
Plain Mucinex (NOT D) for thick secretions ;force NON dairy fluids .   Nasal cleansing in the shower as discussed with lather of mild shampoo.After 10 seconds wash off lather while  exhaling through nostrils. Make sure that all residual soap is removed to prevent irritation.  Flonase OR Nasacort AQ 1 spray in each nostril twice a day as needed. Use the "crossover" technique into opposite nostril spraying toward opposite ear @ 45 degree angle, not straight up into nostril.  Use a Neti pot daily only  as needed for significant sinus congestion; going from open side to congested side . Plain Allegra (NOT D )  160 daily , Loratidine 10 mg , OR Zyrtec 10 mg @ bedtime  as needed for itchy eyes & sneezing. Symbicort  one - two inhalations every 12 hours; gargle and spit after use 

## 2013-08-05 NOTE — Progress Notes (Signed)
Pre visit review using our clinic review tool, if applicable. No additional management support is needed unless otherwise documented below in the visit note. 

## 2013-08-05 NOTE — Progress Notes (Signed)
   Subjective:    Patient ID: Janae SauceKaren D Helfrich, female    DOB: Jul 13, 1950, 63 y.o.   MRN: [id number]  HPI  She has had a cough which has progressed over the last 2 months. She was seen in February for acute bronchitis and was treated with Levaquin as well as narcotic cough medicine. She has  continued the latter on an as needed basis  The cough is essentially dry with only scant sputum production. She has had some fever; last night temperature was 101.1 with chills.  As noted sputum production is minimal , clear to yellow.  She has had some clear nasal discharge as well. Sneezing is a minor issue. She does have postnasal drainage  She does have a 20-pack-year smoking history having quit 1998. The chart states a diagnosis of asthma; she disputes this as bronchodilator sprays have not been of benefit.  Her CBC and differential and chest x-ray performed 07/12/13 were reviewed. Both were totally normal. These results were reviewed with her    Review of Systems  She denies significant frontal sinus headache, facial pain, nasal purulence, dental pain, otic pain, or otic discharge  The cough is not associated with wheezing or shortness of breath.  She denies any significant reflux symptoms  She is not on an ACE inhibitor.  The cough has caused migraine     Objective:   Physical Exam General appearance:good health ;well nourished; no acute distress or increased work of breathing is present.  No  lymphadenopathy about the head, neck, or axilla noted.   Eyes: No conjunctival inflammation or lid edema is present. There is no scleral icterus.  Ears:  External ear exam shows no significant lesions or deformities.  Otoscopic examination reveals clear canals, tympanic membranes are intact bilaterally without bulging, retraction, inflammation or discharge.  Nose:  External nasal examination shows no deformity or inflammation. Nasal mucosa are pink and moist without lesions or exudates. No septal  dislocation or deviation.No obstruction to airflow.   Oral exam: Dental hygiene is good; lips and gums are healthy appearing.There is no oropharyngeal erythema or exudate noted.   Neck:  No deformities, masses, or tenderness noted.   Heart:  Normal rate and regular rhythm. S1 and S2 normal without gallop, murmur, click, rub or other extra sounds.   Lungs:Chest clear to auscultation; no wheezes, rhonchi,rales ,or rubs present.No increased work of breathing.  Brassy nonproductive cough.  Extremities:  No cyanosis, edema, or clubbing  noted    Skin: Warm & dry w/o jaundice or tenting.         Assessment & Plan:  #1 protracted cough following 1 acute bronchitic episode she with Levaquin. Question of reactive airways disease.  #2 rhinitis, nonallergic is probable component.  Plan: See orders

## 2013-12-08 ENCOUNTER — Other Ambulatory Visit (INDEPENDENT_AMBULATORY_CARE_PROVIDER_SITE_OTHER): Payer: BC Managed Care – PPO

## 2013-12-08 ENCOUNTER — Ambulatory Visit (INDEPENDENT_AMBULATORY_CARE_PROVIDER_SITE_OTHER): Payer: BC Managed Care – PPO | Admitting: Internal Medicine

## 2013-12-08 ENCOUNTER — Encounter: Payer: Self-pay | Admitting: Internal Medicine

## 2013-12-08 VITALS — BP 128/88 | HR 85 | Temp 97.9°F | Wt 145.2 lb

## 2013-12-08 DIAGNOSIS — R51 Headache: Secondary | ICD-10-CM

## 2013-12-08 DIAGNOSIS — M175 Other unilateral secondary osteoarthritis of knee: Secondary | ICD-10-CM

## 2013-12-08 DIAGNOSIS — M659 Synovitis and tenosynovitis, unspecified: Secondary | ICD-10-CM

## 2013-12-08 DIAGNOSIS — M65839 Other synovitis and tenosynovitis, unspecified forearm: Secondary | ICD-10-CM

## 2013-12-08 DIAGNOSIS — R059 Cough, unspecified: Secondary | ICD-10-CM

## 2013-12-08 DIAGNOSIS — M65849 Other synovitis and tenosynovitis, unspecified hand: Secondary | ICD-10-CM

## 2013-12-08 DIAGNOSIS — R05 Cough: Secondary | ICD-10-CM

## 2013-12-08 DIAGNOSIS — M1731 Unilateral post-traumatic osteoarthritis, right knee: Secondary | ICD-10-CM

## 2013-12-08 DIAGNOSIS — J310 Chronic rhinitis: Secondary | ICD-10-CM

## 2013-12-08 MED ORDER — GABAPENTIN 100 MG PO CAPS: ORAL_CAPSULE | ORAL | Status: DC

## 2013-12-08 NOTE — Patient Instructions (Addendum)
Your next office appointment will be determined based upon review of your pending labs . Those instructions will be transmitted to you through My Chart .  The Sports Medicine referral will be scheduled for the thumb tenosynovitis and you'll be notified of the time.  Plain Mucinex (NOT D) for thick secretions ;force NON dairy fluids .   Nasal cleansing in the shower as discussed with lather of mild shampoo.After 10 seconds wash off lather while  exhaling through nostrils. Make sure that all residual soap is removed to prevent irritation.  Flonase OR Nasacort AQ 1 spray in each nostril twice a day as needed. Use the "crossover" technique into opposite nostril spraying toward opposite ear @ 45 degree angle, not straight up into nostril.  Use a Neti pot daily only  as needed for significant sinus congestion; going from open side to congested side . Plain Allegra (NOT D )  160 daily , Loratidine 10 mg , OR Zyrtec 10 mg @ bedtime  as needed for itchy eyes & sneezing.   Use an anti-inflammatory cream such as Aspercreme or Zostrix cream twice a day to the affected area as needed. In lieu of this warm moist compresses or  hot water bottle can be used. Do not apply ice .

## 2013-12-08 NOTE — Progress Notes (Signed)
Subjective:    [name], female    DOB: 07/04/1950, 63 y.o.   MRN: [id number]  HPI   She is here with four separate complaints. She's had pain at the right wrist during the last 2 weeks. This is associated with swelling or weakness. It is better in the morning but gets worse through the day. She is right-handed and works on the [employer] at [employer]. The pain can be up to a level VIII  She also has pain in the right knee which she's had for 2 months. This is the site of a previous fracture in  1999.  She describes a sharp pain in the right temple for the last week. It's intermittent lasting seconds to hours. Fioricet helps. She does have a past history of migraines but this is unlike her migraines.  She also describes a cough since she had respiratory tract infection in January of this year. She states that she was seen repeatedly from January to March due to slow resolution of the respiratory tract infection. She intermittently produces some yellow sputum. She describes some itchy, watery eyes, sneezing. She also describes some morning congestion and intermittent postnasal drainage  She's never smoked and has no history of asthma       Review of Systems  She denies associated blurred vision, double vision, loss of vision  She's had no tinnitus or hearing loss  She denies frontal headache, facial pain, nasal purulence, dental pain, or sore throat  The cough is not associated with wheezing or shortness of breath.  She is not having fever, chills, or sweats     Objective:   Physical Exam  Significant or distinguishing  findings on physical exam are documented first.  Below that are other systems examined & findings.   There is tenderness to palpation of the base of the right thumb.There is pain with any range of motion of the thumb. She has mild crepitus of the knees. This actually appears slightly more pronounced on the left than the right. She  describes some slight tenderness to palpation of the right temple. When this area is touched she describes the headache over the scalp.  Gen.: Healthy and well-nourished in appearance. Alert, appropriate and cooperative throughout exam. Appears younger than stated age  Head: Normocephalic without obvious abnormalities Eyes: No corneal or conjunctival inflammation noted. Pupils equal round reactive to light and accommodation. Extraocular motion intact. FOV WNL. Ears: External  ear exam reveals no significant lesions or deformities. Canals clear .TMs normal. Hearing is grossly normal bilaterally. Nose: External nasal exam reveals no deformity or inflammation. Nasal mucosa are pink and moist. No lesions or exudates noted.   Mouth: Oral mucosa and oropharynx reveal no lesions or exudates. Teeth in good repair. Neck: No deformities, masses, or tenderness noted. Range of motion & Thyroid  normal Lungs: Normal respiratory effort; chest expands symmetrically. Lungs are clear to auscultation without rales, wheezes, or increased work of breathing. Heart: Normal rate and rhythm. Normal S1 and S2. No gallop, click, or rub. No murmur. Abdomen: Bowel sounds normal; abdomen soft and nontender. No masses, organomegaly or hernias noted.                                Musculoskeletal/extremities: No deformity or scoliosis noted of  the thoracic or lumbar spine. No clubbing, cyanosis, edema, or significant extremity  deformity noted. Range of motion normal .Tone &  strength normal. Hand joints normal.  Fingernail health good. Able to lie down & sit up w/o help. Negative SLR bilaterally Vascular: Carotid, radial artery, dorsalis pedis and  posterior tibial pulses are full and equal. No bruits present. Neurologic: Alert and oriented x3. Deep tendon reflexes symmetrical and normal. No cranial nerve deficit Gait normal .      Skin: Intact without suspicious lesions or rashes. Lymph: No cervical, axillary  lymphadenopathy present. Psych: Mood and affect are normal. 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

## 2013-12-08 NOTE — Progress Notes (Signed)
Pre visit review using our clinic review tool, if applicable. No additional management support is needed unless otherwise documented below in the visit note. 

## 2013-12-09 LAB — SEDIMENTATION RATE: Sed Rate: 17 mm/hr (ref 0–22)

## 2013-12-14 ENCOUNTER — Ambulatory Visit (INDEPENDENT_AMBULATORY_CARE_PROVIDER_SITE_OTHER): Payer: BC Managed Care – PPO | Admitting: Family Medicine

## 2013-12-14 ENCOUNTER — Encounter: Payer: Self-pay | Admitting: *Deleted

## 2013-12-14 ENCOUNTER — Other Ambulatory Visit (INDEPENDENT_AMBULATORY_CARE_PROVIDER_SITE_OTHER): Payer: BC Managed Care – PPO

## 2013-12-14 ENCOUNTER — Encounter: Payer: Self-pay | Admitting: Family Medicine

## 2013-12-14 VITALS — BP 112/80 | HR 86 | Ht 61.0 in | Wt 145.0 lb

## 2013-12-14 DIAGNOSIS — M25531 Pain in right wrist: Secondary | ICD-10-CM

## 2013-12-14 DIAGNOSIS — M778 Other enthesopathies, not elsewhere classified: Secondary | ICD-10-CM

## 2013-12-14 DIAGNOSIS — M189 Osteoarthritis of first carpometacarpal joint, unspecified: Secondary | ICD-10-CM | POA: Insufficient documentation

## 2013-12-14 DIAGNOSIS — M65849 Other synovitis and tenosynovitis, unspecified hand: Secondary | ICD-10-CM

## 2013-12-14 DIAGNOSIS — M1811 Unilateral primary osteoarthritis of first carpometacarpal joint, right hand: Secondary | ICD-10-CM

## 2013-12-14 DIAGNOSIS — M25539 Pain in unspecified wrist: Secondary | ICD-10-CM

## 2013-12-14 DIAGNOSIS — M19049 Primary osteoarthritis, unspecified hand: Secondary | ICD-10-CM

## 2013-12-14 DIAGNOSIS — M65839 Other synovitis and tenosynovitis, unspecified forearm: Secondary | ICD-10-CM

## 2013-12-14 NOTE — Progress Notes (Signed)
Tawana ScaleZach Smith D.O.  Sports Medicine [address] [place], [place] [address] Phone: [phone]) [phone] Subjective:    I'm seeing this patient by the request  of:  Dr. Alwyn RenHopper  CC: right hand and wrist pain.   BJY:[id number]:Subjective Samantha SauceKaren D Sedlak is a 63 y.o. female coming in with complaint of right wrist pain. Patient states that this pain has actually been going on for multiple months. Denies any radiation of pain or any numbness or weakness. Patient states that it's become more and more difficult to do her job. 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 Patient has tried some over-the-counter medications no significant improvement. Patient has not tried any bracing. Patient states that the pain is more of a throbbing sensation mostly on the dorsal aspect of the wrist and around her thumb. Patient states that this severity of the pain is 7/10. States that he can wake her up at night. She is stopping her from some of her regular daily activities.     Past medical history, social, surgical and family history all reviewed in electronic medical record.   Review of Systems: No headache, visual changes, nausea, vomiting, diarrhea, constipation, dizziness, abdominal pain, skin rash, fevers, chills, night sweats, weight loss, swollen lymph nodes, body aches, joint swelling, muscle aches, chest pain, shortness of breath, mood changes.   Objective Blood pressure 112/80, pulse 86, height 5\' 1"  (1.549 m), weight 145 lb (65.772 kg), SpO2 95.00%.  General: No apparent distress alert and oriented x3 mood and affect normal, dressed appropriately.  HEENT: Pupils equal, extraocular movements intact  Respiratory: Patient's speak in full sentences and does not appear short of breath  Cardiovascular: No lower extremity edema, non tender, no erythema  Skin: Warm dry intact with no signs of infection or rash  on extremities or on axial skeleton.  Abdomen: Soft nontender  Neuro: Cranial nerves II through XII are intact, neurovascularly intact in all extremities with 2+ DTRs and 2+ pulses.  Lymph: No lymphadenopathy of posterior or anterior cervical chain or axillae bilaterally.  Gait normal with good balance and coordination.  MSK:  Non tender with full range of motion and good stability and symmetric strength and tone of shoulders, elbows,  hip, knee and ankles bilaterally.  Wrist: Right Inspection normal with no visible erythema or swelling. ROM smooth and normal with good flexion and extension and ulnar/radial deviation that is symmetrical with opposite wrist. Patient though does have limitation of extension bilaterally with some mild pain. Palpation reveals patient has some mild pain over the lunate and scaphoid bones and no pain over the TFCC or the metacarpals except for the [facility] joint. No snuffbox tenderness specifically No tenderness over Canal of Guyon. Strength 5/5 in all directions without pain. Negative Finkelstein, tinel's and phalens. Mild positive Watson's test. Contralateral wrist unremarkable  MSK US performed of: Right wrist This study was ordered, performed, and interpreted by Terrilee FilesZach Smith D.O.  Wrist: All extensor compartments visualized and tendons with generalized effusion no multiple different tendons. This does seem to be significantly more than the contralateral side. This seems to be in all compartments especially in compartments one 2 and 3. TFCC intact. Scapholunate ligament intact does have degenerative changes. CMC joint has severe osteophytic changes Carpal tunnel visualized and median nerve area normal, flexor tendons all normal in appearance without fraying, tears, or sheath effusions.  Power doppler signal normal.  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    Impression and Recommendations:     This case required  medical decision making of moderate complexity.

## 2013-12-14 NOTE — Assessment & Plan Note (Signed)
Patient does have nonspecific swelling of the right wrist tendon sheaths. This seems to be fairly nonspecific is likely more secondary to an overuse injury. 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 Differential also includes potential lunate subluxation but did not see that on physical exam or ultrasound but based on patient's history we cannot conclude. Patient will try home exercises, and topical anti-inflammatories, icing protocol as well as bracing. Patient was given a brace was fitted by me today. Patient will try these interventions and come back again in 3 weeks. Patient is to make any significant improvement we'll consider injection of the [facility] for diagnostic and therapeutic purposes. He may also need further imaging.

## 2013-12-14 NOTE — Patient Instructions (Addendum)
Very nice to meet you Ice 20 minutes 2 times a day Wear brace day and night for the next 2 weeks then nightly for 2 weeks.  Exercises 3 times a week starting in 3 days.  Pennsiad 2 times daily for next 3 days then as needed.  Turmeric 500mg  twice daily.  Come back in 3 weeks and we will check again and may consider injection in thumb.

## 2013-12-14 NOTE — Assessment & Plan Note (Signed)
Will monitor, discussed OTC medications, icing and exercise, in brace.  Will monitor.

## 2013-12-15 ENCOUNTER — Telehealth: Payer: Self-pay | Admitting: *Deleted

## 2013-12-15 ENCOUNTER — Ambulatory Visit: Payer: BC Managed Care – PPO | Admitting: Family Medicine

## 2013-12-15 MED ORDER — MELOXICAM 15 MG PO TABS: 15.0000 mg | ORAL_TABLET | Freq: Every day | ORAL | Status: DC

## 2013-12-15 NOTE — Telephone Encounter (Signed)
Sent in meloxicam to try.  Please call patient when you can.

## 2013-12-15 NOTE — Telephone Encounter (Signed)
Pt states she is still having the wrist pain, ovc pain relief is not working. Requesting md to call something into her pharmacy...[name]/lmb

## 2013-12-16 NOTE — Telephone Encounter (Signed)
Notified pt with md response.../lmb 

## 2013-12-26 ENCOUNTER — Other Ambulatory Visit: Payer: Self-pay | Admitting: Internal Medicine

## 2013-12-30 ENCOUNTER — Telehealth: Payer: Self-pay | Admitting: Family Medicine

## 2013-12-30 MED ORDER — DICLOFENAC SODIUM 2 % TD SOLN: TRANSDERMAL | Status: DC

## 2013-12-30 NOTE — Telephone Encounter (Signed)
Patient is requesting samples of pennsaid °

## 2013-12-30 NOTE — Telephone Encounter (Signed)
Called pt, unable to leave a msg.   

## 2013-12-31 NOTE — Telephone Encounter (Signed)
Discussed with pt

## 2014-01-04 ENCOUNTER — Ambulatory Visit: Payer: BC Managed Care – PPO | Admitting: Family Medicine

## 2014-01-25 ENCOUNTER — Ambulatory Visit: Payer: BC Managed Care – PPO | Admitting: Family Medicine

## 2014-02-24 ENCOUNTER — Encounter: Payer: Self-pay | Admitting: Internal Medicine

## 2014-02-24 ENCOUNTER — Other Ambulatory Visit (INDEPENDENT_AMBULATORY_CARE_PROVIDER_SITE_OTHER): Payer: BC Managed Care – PPO

## 2014-02-24 ENCOUNTER — Ambulatory Visit (INDEPENDENT_AMBULATORY_CARE_PROVIDER_SITE_OTHER): Payer: BC Managed Care – PPO | Admitting: Internal Medicine

## 2014-02-24 ENCOUNTER — Ambulatory Visit (INDEPENDENT_AMBULATORY_CARE_PROVIDER_SITE_OTHER)
Admission: RE | Admit: 2014-02-24 | Discharge: 2014-02-24 | Disposition: A | Discharge status: Home / Self Care | Payer: BC Managed Care – PPO | Source: Ambulatory Visit | Attending: Internal Medicine | Admitting: Internal Medicine

## 2014-02-24 VITALS — BP 138/80 | HR 88 | Temp 98.1°F | Wt 144.0 lb

## 2014-02-24 DIAGNOSIS — Z0000 Encounter for general adult medical examination without abnormal findings: Secondary | ICD-10-CM

## 2014-02-24 DIAGNOSIS — R079 Chest pain, unspecified: Secondary | ICD-10-CM

## 2014-02-24 DIAGNOSIS — Z0189 Encounter for other specified special examinations: Secondary | ICD-10-CM

## 2014-02-24 DIAGNOSIS — I10 Essential (primary) hypertension: Secondary | ICD-10-CM

## 2014-02-24 DIAGNOSIS — E785 Hyperlipidemia, unspecified: Secondary | ICD-10-CM

## 2014-02-24 LAB — URINALYSIS, ROUTINE W REFLEX MICROSCOPIC
BILIRUBIN URINE: NEGATIVE
HGB URINE DIPSTICK: NEGATIVE
KETONES UR: NEGATIVE
LEUKOCYTES UA: NEGATIVE
Nitrite: NEGATIVE
PH: 5.5 (ref 5.0–8.0)
Specific Gravity, Urine: 1.025 (ref 1.000–1.030)
Total Protein, Urine: NEGATIVE
Urine Glucose: NEGATIVE
Urobilinogen, UA: 0.2 (ref 0.0–1.0)

## 2014-02-24 LAB — CBC WITH DIFFERENTIAL/PLATELET
BASOS ABS: 0 10*3/uL (ref 0.0–0.1)
Basophils Relative: 0.4 % (ref 0.0–3.0)
EOS ABS: 0.2 10*3/uL (ref 0.0–0.7)
EOS PCT: 2.2 % (ref 0.0–5.0)
HCT: 40 % (ref 36.0–46.0)
Hemoglobin: 13.5 g/dL (ref 12.0–15.0)
Lymphocytes Relative: 34.4 % (ref 12.0–46.0)
Lymphs Abs: 2.6 10*3/uL (ref 0.7–4.0)
MCHC: 33.8 g/dL (ref 30.0–36.0)
MCV: 86 fl (ref 78.0–100.0)
MONO ABS: 0.5 10*3/uL (ref 0.1–1.0)
Monocytes Relative: 6.7 % (ref 3.0–12.0)
NEUTROS PCT: 56.3 % (ref 43.0–77.0)
Neutro Abs: 4.3 10*3/uL (ref 1.4–7.7)
PLATELETS: 367 10*3/uL (ref 150.0–400.0)
RBC: 4.65 Mil/uL (ref 3.87–5.11)
RDW: 13.6 % (ref 11.5–15.5)
WBC: 7.6 10*3/uL (ref 4.0–10.5)

## 2014-02-24 LAB — BASIC METABOLIC PANEL
BUN: 11 mg/dL (ref 6–23)
CO2: 29 mEq/L (ref 19–32)
CREATININE: 0.8 mg/dL (ref 0.4–1.2)
Calcium: 9.2 mg/dL (ref 8.4–10.5)
Chloride: 107 mEq/L (ref 96–112)
GFR: 81.83 mL/min (ref >60.00)
GLUCOSE: 81 mg/dL (ref 70–99)
Potassium: 4.3 mEq/L (ref 3.5–5.1)
Sodium: 139 mEq/L (ref 135–145)

## 2014-02-24 LAB — LIPID PANEL
CHOL/HDL RATIO: 4
CHOLESTEROL: 257 mg/dL — AB (ref 0–200)
HDL: 63.3 mg/dL (ref >39.00)
NonHDL: 193.7
Triglycerides: 204 mg/dL — ABNORMAL HIGH (ref 0.0–149.0)
VLDL: 40.8 mg/dL — ABNORMAL HIGH (ref 0.0–40.0)

## 2014-02-24 LAB — TSH: TSH: 1.83 u[IU]/mL (ref 0.35–4.50)

## 2014-02-24 LAB — HEPATIC FUNCTION PANEL
ALT: 21 U/L (ref 0–35)
AST: 23 U/L (ref 0–37)
Albumin: 4.1 g/dL (ref 3.5–5.2)
Alkaline Phosphatase: 77 U/L (ref 39–117)
BILIRUBIN DIRECT: 0.1 mg/dL (ref 0.0–0.3)
TOTAL PROTEIN: 7.5 g/dL (ref 6.0–8.3)
Total Bilirubin: 0.2 mg/dL (ref 0.2–1.2)

## 2014-02-24 MED ORDER — PANTOPRAZOLE SODIUM 40 MG PO TBEC: 40.0000 mg | DELAYED_RELEASE_TABLET | Freq: Every day | ORAL | Status: DC

## 2014-02-24 MED ORDER — ASPIRIN EC 81 MG PO TBEC: 81.0000 mg | DELAYED_RELEASE_TABLET | Freq: Every day | ORAL | Status: AC

## 2014-02-24 NOTE — Progress Notes (Signed)
Pre visit review using our clinic review tool, if applicable. No additional management support is needed unless otherwise documented below in the visit note. 

## 2014-02-24 NOTE — Patient Instructions (Addendum)
Your EKG was OK today  Please take all new medication as prescribed - the protonix 40 mg per day  Please also start Aspirin 81 mg - 1 per day  Please continue all other medications as before, and refills have been done if requested.  Please have the pharmacy call with any other refills you may need.  Please continue your efforts at being more active, low cholesterol diet, and weight control.  You are otherwise up to date with prevention measures today.  Please keep your appointments with your specialists as you may have planned  You will be contacted regarding the referral for: stress test  Please go to the XRAY Department in the Basement (go straight as you get off the elevator) for the x-ray testing  Please go to the LAB in the Basement (turn left off the elevator) for the tests to be done today  You will be contacted by phone if any changes need to be made immediately.  Otherwise, you will receive a letter about your results with an explanation, but please check with MyChart first.  Please return in 1 year for your yearly visit, or sooner if needed, with Lab testing done 3-5 days before

## 2014-02-24 NOTE — Progress Notes (Signed)
Subjective:    Patient ID: Janae SauceKaren D Simic, female    DOB: 1950/11/12, 63 y.o.   MRN: [id number]  HPI    Here for wellness and f/u;  Overall doing ok;  Pt denies worsening SOB, DOE, wheezing, orthopnea, PND, worsening LE edema, palpitations, dizziness or syncope, but did have episode CP last night before sleep, squeezing pain, mid chest SSCP, mod to severe, lasted approx 30 min, does not recall difficulty swallowing, but ? mild diaphoresis (felt hot), + palpitations (beating harder), no sob or dizzy. Did have some radiation to right shoulder and back. Has had new more freq acid reflux with sour brash.  Has some nausea off and on for several days prior - ? Work stress related .  Has been eating free grapes from the [employer] for several days, not on PPI. No wt loss. Denies other abd pain, dysphagia, n/v, bowel change or blood.   Pt denies neurological change such as new headache, facial or extremity weakness.  Pt denies polydipsia, polyuria, or low sugar symptoms. Pt states overall good compliance with treatment and medications, good tolerability, and has been trying to follow lower cholesterol diet.  Pt denies worsening depressive symptoms, suicidal ideation or panic. No fever, night sweats, wt loss, loss of appetite, or other constitutional symptoms.  Pt states good ability with ADL's, has low fall risk, home safety reviewed and adequate, no other significant changes in hearing or vision, and only occasionally active with exercise.   Past Medical History  Diagnosis Date  . ALLERGIC RHINITIS   . [name]   . ANXIETY   . ASTHMA   . DEPRESSION   . HYPERLIPIDEMIA   . COMMON MIGRAINE   . [name]   . DIVERTICULOSIS, COLON   . Mild mitral regurgitation by prior echocardiogram    Past Surgical History  Procedure Laterality Date  . Appendectomy    . Abdominal hysterectomy      reports that she has quit smoking. She does not have any smokeless tobacco history on  file. She reports that she does not drink alcohol or use illicit drugs. family history includes Heart disease in her brother and father. There is no history of Colon cancer. Allergies  Allergen Reactions  . Trazodone And Nefazodone    Current Outpatient Prescriptions on File Prior to Visit  Medication Sig Dispense Refill  . amitriptyline (ELAVIL) 50 MG tablet TAKE 1 TABLET AT BEDTIME AS NEEDED FOR SLEEP.  90 tablet  3  . butalbital-acetaminophen-caffeine (ESGIC) 50-325-40 MG per tablet Take 1 tablet by mouth 2 (two) times daily as needed for headache.  60 tablet  1  . Calcium Carbonate-Vit D-Min (CALCIUM 1200 PO) Take 1 capsule by mouth daily.      . Diclofenac Sodium 2 % SOLN Apply twice daily.  112 g  1  . gabapentin (NEURONTIN) 100 MG capsule One pill every eight hours as needed; dose may be increased by one pill each dose after 72 hours if only partially effective  30 capsule  0  . Investigational vitamin D 600 UNITS capsule SWOG S0812 Take 1,200 Units by mouth daily. Take with food.      . meloxicam (MOBIC) 15 MG tablet Take 1 tablet (15 mg total) by mouth daily.  30 tablet  0  . MINIVELLE 0.05 MG/24HR patch       . VESICARE 5 MG tablet Take 5 mg by mouth daily.        No current facility-administered medications on file  prior to visit.   Review of Systems Constitutional: Negative for increased diaphoresis, other activity, appetite or other siginficant weight change  HENT: Negative for worsening hearing loss, ear pain, facial swelling, mouth sores and neck stiffness.   Eyes: Negative for other worsening pain, redness or visual disturbance.  Respiratory: Negative for shortness of breath and wheezing.   Cardiovascular: Negative for chest pain and palpitations.  Gastrointestinal: Negative for diarrhea, blood in stool, abdominal distention or other pain Genitourinary: Negative for hematuria, flank pain or change in urine volume.  Musculoskeletal: Negative for myalgias or other joint  complaints.  Skin: Negative for color change and wound.  Neurological: Negative for syncope and numbness. other than noted Hematological: Negative for adenopathy. or other swelling Psychiatric/Behavioral: Negative for hallucinations, self-injury, decreased concentration or other worsening agitation.      Objective:   Physical Exam BP 138/80  Pulse 88  Temp(Src) 98.1 F (36.7 C) (Oral)  Wt 144 lb (65.318 kg)  SpO2 95% VS noted,  Constitutional: Pt is oriented to person, place, and time. Appears well-developed and well-nourished.  Head: Normocephalic and atraumatic.  Right Ear: External ear normal.  Left Ear: External ear normal.  Nose: Nose normal.  Mouth/Throat: Oropharynx is clear and moist.  Eyes: Conjunctivae and EOM are normal. Pupils are equal, round, and reactive to light.  Neck: Normal range of motion. Neck supple. No JVD present. No tracheal deviation present.  Cardiovascular: Normal rate, regular rhythm, normal heart sounds and intact distal pulses.   Pulmonary/Chest: Effort normal and breath sounds without rales or wheezing  Abdominal: Soft. Bowel sounds are normal. NT. No HSM  Musculoskeletal: Normal range of motion. Exhibits no edema.  Lymphadenopathy:  Has no cervical adenopathy.  Neurological: Pt is alert and oriented to person, place, and time. Pt has normal reflexes. No cranial nerve deficit. Motor grossly intact Skin: Skin is warm and dry. No rash noted.  Psychiatric:  Has normal mood and affect. Behavior is normal.     Assessment & Plan:

## 2014-02-24 NOTE — Assessment & Plan Note (Addendum)
ECG reviewed as per emr, etiology unclear, for empiric PPI, cxr, stress test, and labs as ordered, also start asa 81 qd

## 2014-02-25 ENCOUNTER — Ambulatory Visit: Payer: BC Managed Care – PPO | Admitting: Family Medicine

## 2014-02-25 LAB — LDL CHOLESTEROL, DIRECT: Direct LDL: 159.4 mg/dL

## 2014-02-28 ENCOUNTER — Telehealth: Payer: Self-pay | Admitting: Internal Medicine

## 2014-02-28 NOTE — Telephone Encounter (Signed)
Pt was seen by Dr Jonny RuizJohn last Thursday and did not receive the requested doctors note during her visit. Pt states she needs note stating that she was present for her appt that day. Pt would like a copy emailed to [email or web address] if possible?

## 2014-02-28 NOTE — Assessment & Plan Note (Signed)

## 2014-02-28 NOTE — Telephone Encounter (Signed)
Ok with me, thanks.

## 2014-02-28 NOTE — Assessment & Plan Note (Signed)
stable overall by history and exam, recent data reviewed with pt, and pt to continue medical treatment as before,  to f/u any worsening symptoms or concerns BP Readings from Last 3 Encounters:  02/24/14 138/80  12/14/13 112/80  12/08/13 128/88

## 2014-02-28 NOTE — Telephone Encounter (Signed)
Dr. Jonny RuizJohn is this ok to generated excuse.Marland Kitchen.[name]/lmb

## 2014-03-01 ENCOUNTER — Other Ambulatory Visit: Payer: Self-pay | Admitting: Internal Medicine

## 2014-03-01 MED ORDER — ATORVASTATIN CALCIUM 10 MG PO TABS: 10.0000 mg | ORAL_TABLET | Freq: Every day | ORAL | Status: DC

## 2014-03-01 NOTE — Telephone Encounter (Signed)
Generate letter notified pt we can't e-mail but can fax. Pt stated she would like it mail to her address. Mailed.[name].[name]/lmb

## 2014-03-02 ENCOUNTER — Other Ambulatory Visit: Payer: Self-pay

## 2014-03-02 MED ORDER — ATORVASTATIN CALCIUM 10 MG PO TABS: 10.0000 mg | ORAL_TABLET | Freq: Every day | ORAL | Status: DC

## 2014-03-02 MED ORDER — PANTOPRAZOLE SODIUM 40 MG PO TBEC: 40.0000 mg | DELAYED_RELEASE_TABLET | Freq: Every day | ORAL | Status: DC

## 2014-03-04 ENCOUNTER — Encounter: Payer: Self-pay | Admitting: Internal Medicine

## 2014-03-07 ENCOUNTER — Ambulatory Visit (HOSPITAL_COMMUNITY): Payer: BC Managed Care – PPO | Attending: Cardiology | Admitting: Radiology

## 2014-03-07 VITALS — BP 99/66 | HR 78 | Ht 61.0 in | Wt 142.0 lb

## 2014-03-07 DIAGNOSIS — R079 Chest pain, unspecified: Secondary | ICD-10-CM | POA: Diagnosis present

## 2014-03-07 DIAGNOSIS — R002 Palpitations: Secondary | ICD-10-CM | POA: Diagnosis not present

## 2014-03-07 DIAGNOSIS — I10 Essential (primary) hypertension: Secondary | ICD-10-CM | POA: Insufficient documentation

## 2014-03-07 MED ORDER — TECHNETIUM TC 99M SESTAMIBI GENERIC - CARDIOLITE
11.0000 | Freq: Once | INTRAVENOUS | Status: AC | PRN
  Administered 2014-03-07: 11 via INTRAVENOUS

## 2014-03-07 NOTE — Progress Notes (Signed)
[facility] SITE 3 NUCLEAR MED [address] [place], [place] [address] [phone]    Cardiology Nuclear Med Gerre PebblesStudy  [name] is a 63 y.o. female     MRN : [id number]     DOB: [birth year]  Procedure Date: 03/08/2014  Nuclear Med Background Indication for Stress Test:  Evaluation for Ischemia History:  MPI 2011 (normal) EF 83%, Asthma Cardiac Risk Factors: Hypertension  Symptoms:  Chest Pain (last date of chest discomfort was last Wednesday) and Palpitations   Nuclear Pre-Procedure Caffeine/Decaff Intake:  None> 12 hrs NPO After: 6:00am   Lungs:  clear O2 Sat: 93% on room air. IV 0.9% NS with Angio Cath:  22g  IV Site: R Wrist, tolerated well IV Started by:  Irean HongPatsy Edwards, RN  Chest Size (in):  36 Cup Size: B  Height: 5\' 1"  (1.549 m)  Weight:  142 lb (64.411 kg)  BMI:  Body mass index is 26.84 kg/(m^2). Tech Comments:  Rest only done on 03-07-2014 due to caffeine.Irean HongPatsy Edwards, RN.    Nuclear Med Study 1 or 2 day study: 2 day  Stress Test Type:  Treadmill/Lexiscan  Reading MD: N/A  Order Authorizing Provider:  Oliver BarreJames John, MD  Resting Radionuclide: Technetium 8753m Sestamibi  Resting Radionuclide Dose: 11.0 mCi   Stress Radionuclide:  Technetium 7053m Sestamibi  Stress Radionuclide Dose: 33.0 mCi           Stress Protocol Rest HR: 78 Stress HR: 111  Rest BP: 99/66 Stress BP: 52/33  Exercise Time (min): n/a METS: n/a           Dose of Adenosine (mg):  n/a Dose of Lexiscan: 0.4 mg  Dose of Atropine (mg): n/a Dose of Dobutamine: n/a mcg/kg/min (at max HR)  Stress Test Technologist: Nelson ChimesSharon Brooks, BS-ES  Nuclear Technologist:  Doyne Keelonya Yount, CNMT     Rest Procedure:  Myocardial perfusion imaging was performed at rest 45 minutes following the intravenous administration of Technetium 4953m Sestamibi. Rest ECG: NSR with non-specific ST-T wave changes  Stress Procedure:  The patient received IV Lexiscan 0.4 mg over 15-seconds with concurrent low level exercise and  then Technetium 2253m Sestamibi was injected at 30-seconds while the patient continued walking one more minute.  Quantitative spect images were obtained after a 45-minute delay.  During the infusion of Lexiscan the patient complained of lightheadedness and a headache. These symptoms subsided in recovery.  Stress ECG: No significant ST segment change suggestive of ischemia.  QPS Raw Data Images:  Mild breast attenuation.  Normal left ventricular size. Stress Images:  There is decreased uptake in the anterior wall. Rest Images:  There is decreased uptake in the anterior wall. Subtraction (SDS):  No evidence of ischemia. Transient Ischemic Dilatation (Normal <1.22):  0.81 Lung/Heart Ratio (Normal <0.45):  0.38  Quantitative Gated Spect Images QGS EDV:  45 ml QGS ESV:  10 ml  Impression Exercise Capacity:  Lexiscan with low level exercise. BP Response:  Hypotensive blood pressure response. Clinical Symptoms:  No chest pain or dyspnea. ECG Impression:  No significant ST segment change suggestive of ischemia. Comparison with Prior Nuclear Study: Compared to 04/23/10, no significant change.  Overall Impression:  Low risk stress nuclear study with small, mild, fixed anterior defect consistent with soft tissue attenuation; no ischemia.  LV Ejection Fraction: 77%.  LV Wall Motion:  NL LV Function; NL Wall Motion   Olga MillersBrian Arhan Mcmanamon

## 2014-03-08 ENCOUNTER — Ambulatory Visit (HOSPITAL_COMMUNITY): Payer: BC Managed Care – PPO | Attending: Internal Medicine

## 2014-03-08 DIAGNOSIS — R079 Chest pain, unspecified: Secondary | ICD-10-CM | POA: Diagnosis not present

## 2014-03-08 DIAGNOSIS — R0989 Other specified symptoms and signs involving the circulatory and respiratory systems: Secondary | ICD-10-CM

## 2014-03-08 MED ORDER — TECHNETIUM TC 99M SESTAMIBI GENERIC - CARDIOLITE
33.0000 | Freq: Once | INTRAVENOUS | Status: AC | PRN
  Administered 2014-03-08: 33 via INTRAVENOUS

## 2014-03-08 MED ORDER — REGADENOSON 0.4 MG/5ML IV SOLN
0.4000 mg | Freq: Once | INTRAVENOUS | Status: AC
  Administered 2014-03-08: 0.4 mg via INTRAVENOUS

## 2014-03-11 ENCOUNTER — Other Ambulatory Visit: Payer: Self-pay

## 2014-03-17 ENCOUNTER — Telehealth: Payer: Self-pay | Admitting: Internal Medicine

## 2014-03-17 NOTE — Telephone Encounter (Signed)
Forwarding 2 pages to Dr.John, James   °

## 2014-03-18 NOTE — Telephone Encounter (Signed)
Pt called in requesting some lab result for her job.  Also requesting to change her note from her work.  It said she was here for a physical instead of chest pains     Best number to reach her is [phone].  She will be in meeting from 2-4pm today

## 2014-03-18 NOTE — Telephone Encounter (Signed)
Letter completed, signed and mailed to patients home.

## 2014-03-18 NOTE — Telephone Encounter (Signed)
Patient informed of results she requested.    Regarding the letter she needs stated work note needs to state out of work for illness not physical (see letter dated 03/01/14).  Patient was out of work October 1 and 2.  Patient request note to be mailed to her home

## 2014-03-18 NOTE — Telephone Encounter (Signed)
Ok for changes as per pt

## 2014-04-06 ENCOUNTER — Encounter: Payer: Self-pay | Admitting: Internal Medicine

## 2014-04-06 ENCOUNTER — Ambulatory Visit (INDEPENDENT_AMBULATORY_CARE_PROVIDER_SITE_OTHER)
Admission: RE | Admit: 2014-04-06 | Discharge: 2014-04-06 | Disposition: A | Discharge status: Home / Self Care | Payer: BC Managed Care – PPO | Source: Ambulatory Visit | Attending: Internal Medicine | Admitting: Internal Medicine

## 2014-04-06 ENCOUNTER — Ambulatory Visit (INDEPENDENT_AMBULATORY_CARE_PROVIDER_SITE_OTHER): Payer: BC Managed Care – PPO | Admitting: Internal Medicine

## 2014-04-06 VITALS — BP 108/62 | HR 117 | Temp 99.3°F | Wt 145.1 lb

## 2014-04-06 DIAGNOSIS — R059 Cough, unspecified: Secondary | ICD-10-CM | POA: Insufficient documentation

## 2014-04-06 DIAGNOSIS — J4520 Mild intermittent asthma, uncomplicated: Secondary | ICD-10-CM

## 2014-04-06 DIAGNOSIS — R05 Cough: Secondary | ICD-10-CM

## 2014-04-06 DIAGNOSIS — I10 Essential (primary) hypertension: Secondary | ICD-10-CM

## 2014-04-06 MED ORDER — HYDROCODONE-HOMATROPINE 5-1.5 MG/5ML PO SYRP: 5.0000 mL | ORAL_SOLUTION | Freq: Four times a day (QID) | ORAL | Status: DC | PRN

## 2014-04-06 MED ORDER — LEVOFLOXACIN 250 MG PO TABS: 250.0000 mg | ORAL_TABLET | Freq: Every day | ORAL | Status: DC

## 2014-04-06 NOTE — Patient Instructions (Signed)
Please take all new medication as prescribed  Please continue all other medications as before, and refills have been done if requested.  Please have the pharmacy call with any other refills you may need.  Please keep your appointments with your specialists as you may have planned  Please go to the XRAY Department in the Basement (go straight as you get off the elevator) for the x-ray testing  You will be contacted by phone if any changes need to be made immediately.  Otherwise, you will receive a letter about your results with an explanation, but please check with MyChart first.  Please remember to sign up for MyChart if you have not done so, as this will be important to you in the future with finding out test results, communicating by private email, and scheduling acute appointments online when needed.      

## 2014-04-06 NOTE — Progress Notes (Signed)
Subjective:    [name], female    DOB: 1950/06/17, 63 y.o.   MRN: [id number]  HPI  Here with acute onset mild to mod 2-3 days ST, HA, general weakness and malaise, with prod cough greenish sputum, but Pt denies chest pain, increased sob or doe, wheezing, orthopnea, PND, increased LE swelling, palpitations, dizziness or syncope. Also with some nausea but no n/v.   Past Medical History  Diagnosis Date  . ALLERGIC RHINITIS   . [name]   . ANXIETY   . ASTHMA   . DEPRESSION   . HYPERLIPIDEMIA   . COMMON MIGRAINE   . [name]   . DIVERTICULOSIS, COLON   . Mild mitral regurgitation by prior echocardiogram    Past Surgical History  Procedure Laterality Date  . Appendectomy    . Abdominal hysterectomy      reports that she has quit smoking. She does not have any smokeless tobacco history on file. She reports that she does not drink alcohol or use illicit drugs. family history includes Heart disease in her brother and father. There is no history of Colon cancer. Allergies  Allergen Reactions  . Nefazodone   . Trazodone And Nefazodone    Current Outpatient Prescriptions on File Prior to Visit  Medication Sig Dispense Refill  . amitriptyline (ELAVIL) 50 MG tablet TAKE 1 TABLET AT BEDTIME AS NEEDED FOR SLEEP. 90 tablet 3  . aspirin EC 81 MG tablet Take 1 tablet (81 mg total) by mouth daily. 90 tablet 11  . atorvastatin (LIPITOR) 10 MG tablet Take 1 tablet (10 mg total) by mouth daily. 90 tablet 3  . butalbital-acetaminophen-caffeine (ESGIC) 50-325-40 MG per tablet Take 1 tablet by mouth 2 (two) times daily as needed for headache. 60 tablet 1  . Calcium Carbonate-Vit D-Min (CALCIUM 1200 PO) Take 1 capsule by mouth daily.    . Diclofenac Sodium 2 % SOLN Apply twice daily. 112 g 1  . gabapentin (NEURONTIN) 100 MG capsule One pill every eight hours as needed; dose may be increased by one pill each dose after 72 hours if only partially  effective 30 capsule 0  . Investigational vitamin D 600 UNITS capsule SWOG S0812 Take 1,200 Units by mouth daily. Take with food.    . meloxicam (MOBIC) 15 MG tablet Take 1 tablet (15 mg total) by mouth daily. 30 tablet 0  . MINIVELLE 0.05 MG/24HR patch     . pantoprazole (PROTONIX) 40 MG tablet Take 1 tablet (40 mg total) by mouth daily. 90 tablet 3  . VESICARE 5 MG tablet Take 5 mg by mouth daily.      No current facility-administered medications on file prior to visit.   Review of Systems  Constitutional: Negative for unusual diaphoresis or other sweats  HENT: Negative for ringing in ear Eyes: Negative for double vision or worsening visual disturbance.  Respiratory: Negative for choking and stridor.   Gastrointestinal: Negative for vomiting or other signifcant bowel change Genitourinary: Negative for hematuria or decreased urine volume.  Musculoskeletal: Negative for other MSK pain or swelling Skin: Negative for color change and worsening wound.  Neurological: Negative for tremors and numbness other than noted  Psychiatric/Behavioral: Negative for decreased concentration or agitation other than above       Objective:   Physical Exam BP 108/62 mmHg  Pulse 117  Temp(Src) 99.3 F (37.4 C) (Oral)  Wt 145 lb 2 oz (65.828 kg)  SpO2 93% VS noted, mild ill Constitutional: Pt appears  well-developed, well-nourished.  HENT: Head: NCAT.  Right Ear: External ear normal.  Left Ear: External ear normal.  Eyes: . Pupils are equal, round, and reactive to light. Conjunctivae and EOM are normal Bilat tm's with mild erythema.  Max sinus areas mild tender.  Pharynx with mild erythema, no exudate Neck: Normal range of motion. Neck supple.  Cardiovascular: Normal rate and regular rhythm.   Pulmonary/Chest: Effort normal and breath sounds decreased, few wheezes noted, no rales  Abd:  Soft , + BS, no HSM Neurological: Pt is alert. Not confused , motor grossly intact Skin: Skin is warm. No rash,  No LE edema Psychiatric: Pt behavior is normal. No agitation.     Assessment & Plan:

## 2014-04-06 NOTE — Progress Notes (Signed)
Pre visit review using our clinic review tool, if applicable. No additional management support is needed unless otherwise documented below in the visit note. 

## 2014-04-10 NOTE — Assessment & Plan Note (Signed)
stable overall by history and exam, recent data reviewed with pt, and pt to continue medical treatment as before,  to f/u any worsening symptoms or concerns SpO2 Readings from Last 3 Encounters:  04/06/14 93%  03/08/14 93%  03/08/14 93%

## 2014-04-10 NOTE — Assessment & Plan Note (Signed)
stable overall by history and exam, recent data reviewed with pt, and pt to continue medical treatment as before,  to f/u any worsening symptoms or concerns BP Readings from Last 3 Encounters:  04/06/14 108/62  03/08/14 99/66  03/08/14 99/66

## 2014-04-10 NOTE — Assessment & Plan Note (Signed)
C/w prob bronchitis vs pna, for cxr, Mild to mod, for antibx course, cough med, to f/u any worsening symptoms or concerns

## 2014-04-13 ENCOUNTER — Telehealth: Payer: Self-pay | Admitting: Internal Medicine

## 2014-04-13 NOTE — Telephone Encounter (Signed)
Patient informed. 

## 2014-04-13 NOTE — Telephone Encounter (Signed)
Yes, this would be fine as it is a good antihistamine and does not cause sedation

## 2014-04-13 NOTE — Telephone Encounter (Signed)
Pt wants to know if she can take Allegra with her current medications, pls advise.

## 2014-04-20 ENCOUNTER — Encounter: Payer: Self-pay | Admitting: Internal Medicine

## 2014-04-20 ENCOUNTER — Ambulatory Visit (INDEPENDENT_AMBULATORY_CARE_PROVIDER_SITE_OTHER): Payer: BC Managed Care – PPO | Admitting: Internal Medicine

## 2014-04-20 VITALS — BP 110/78 | HR 104 | Temp 99.3°F | Wt 143.2 lb

## 2014-04-20 DIAGNOSIS — R062 Wheezing: Secondary | ICD-10-CM | POA: Insufficient documentation

## 2014-04-20 DIAGNOSIS — K5732 Diverticulitis of large intestine without perforation or abscess without bleeding: Secondary | ICD-10-CM

## 2014-04-20 DIAGNOSIS — I10 Essential (primary) hypertension: Secondary | ICD-10-CM

## 2014-04-20 MED ORDER — METHYLPREDNISOLONE ACETATE 80 MG/ML IJ SUSP
80.0000 mg | Freq: Once | INTRAMUSCULAR | Status: AC
  Administered 2014-04-20: 80 mg via INTRAMUSCULAR

## 2014-04-20 MED ORDER — CEFTRIAXONE SODIUM 1 G IJ SOLR
1.0000 g | Freq: Once | INTRAMUSCULAR | Status: AC
  Administered 2014-04-20: 1 g via INTRAMUSCULAR

## 2014-04-20 MED ORDER — ALBUTEROL SULFATE HFA 108 (90 BASE) MCG/ACT IN AERS: 2.0000 | INHALATION_SPRAY | Freq: Four times a day (QID) | RESPIRATORY_TRACT | Status: DC | PRN

## 2014-04-20 MED ORDER — METRONIDAZOLE 500 MG PO TABS: 500.0000 mg | ORAL_TABLET | Freq: Three times a day (TID) | ORAL | Status: DC

## 2014-04-20 MED ORDER — CIPROFLOXACIN HCL 500 MG PO TABS: 500.0000 mg | ORAL_TABLET | Freq: Two times a day (BID) | ORAL | Status: DC

## 2014-04-20 NOTE — Assessment & Plan Note (Signed)
Post bronchitis/pna vs asthma exacerbation most likely, for depomedrol IM, proair hfa prn,  to f/u any worsening symptoms or concerns, recent cxr reviewed with pt

## 2014-04-20 NOTE — Assessment & Plan Note (Signed)
Clinical dx, ideally would include cbc/labs and CT but unavailable at this time and pt declines ER eval; for rocephin IM, and empiric cipro/flagyl, to ER for any worsening fever, chills, pain, n/v or other unusual symptoms, weakness, falls

## 2014-04-20 NOTE — Progress Notes (Signed)
Pre visit review using our clinic review tool, if applicable. No additional management support is needed unless otherwise documented below in the visit note. 

## 2014-04-20 NOTE — Progress Notes (Signed)
Subjective:    [name], female    DOB: 12-15-1950, 63 y.o.   MRN: [id number]  HPI Here to f/u, cough has resolved but now with worsening ability to take deep breaths and mild tightness/wheezing remains, o/w Pt denies chest pain, increased sob or doe, orthopnea, PND, increased LE swelling, palpitations, dizziness or syncope. Finished levaquin, some better, then worse again. Last 2-3 days.also c/o worsening crampy abd pain, mod to severe, mostly LLQ but also some LUQ and mid lower abd as well, without radiation to back, n/v, Denies worsening reflux,, dysphagia, n/v, bowel change or blood. + BM earlier today, passing gas. Has hx of diverticulosis on colonscopy recent.   Denies urinary symptoms such as dysuria, frequency, urgency, flank pain, hematuria or n/v, fever, chills. Declines ER eval at this time, she understands I dont have immediate access to lab or CT at this time. Past Medical History  Diagnosis Date  . ALLERGIC RHINITIS   . [name]   . ANXIETY   . ASTHMA   . DEPRESSION   . HYPERLIPIDEMIA   . COMMON MIGRAINE   . [name]   . DIVERTICULOSIS, COLON   . Mild mitral regurgitation by prior echocardiogram    Past Surgical History  Procedure Laterality Date  . Appendectomy    . Abdominal hysterectomy      reports that she has quit smoking. She does not have any smokeless tobacco history on file. She reports that she does not drink alcohol or use illicit drugs. family history includes Heart disease in her brother and father. There is no history of Colon cancer. Allergies  Allergen Reactions  . Nefazodone   . Trazodone And Nefazodone    Current Outpatient Prescriptions on File Prior to Visit  Medication Sig Dispense Refill  . amitriptyline (ELAVIL) 50 MG tablet TAKE 1 TABLET AT BEDTIME AS NEEDED FOR SLEEP. 90 tablet 3  . aspirin EC 81 MG tablet Take 1 tablet (81 mg total) by mouth daily. 90 tablet 11  . atorvastatin (LIPITOR) 10  MG tablet Take 1 tablet (10 mg total) by mouth daily. 90 tablet 3  . butalbital-acetaminophen-caffeine (ESGIC) 50-325-40 MG per tablet Take 1 tablet by mouth 2 (two) times daily as needed for headache. 60 tablet 1  . Calcium Carbonate-Vit D-Min (CALCIUM 1200 PO) Take 1 capsule by mouth daily.    . Diclofenac Sodium 2 % SOLN Apply twice daily. 112 g 1  . gabapentin (NEURONTIN) 100 MG capsule One pill every eight hours as needed; dose may be increased by one pill each dose after 72 hours if only partially effective 30 capsule 0  . Investigational vitamin D 600 UNITS capsule SWOG S0812 Take 1,200 Units by mouth daily. Take with food.    . meloxicam (MOBIC) 15 MG tablet Take 1 tablet (15 mg total) by mouth daily. 30 tablet 0  . MINIVELLE 0.05 MG/24HR patch     . pantoprazole (PROTONIX) 40 MG tablet Take 1 tablet (40 mg total) by mouth daily. 90 tablet 3  . VESICARE 5 MG tablet Take 5 mg by mouth daily.      No current facility-administered medications on file prior to visit.    Review of Systems  Constitutional: Negative for unusual diaphoresis or other sweats  HENT: Negative for ringing in ear Eyes: Negative for double vision or worsening visual disturbance.  Respiratory: Negative for choking and stridor.   Gastrointestinal: Negative for vomiting or other signifcant bowel change Genitourinary: Negative for hematuria or  decreased urine volume.  Musculoskeletal: Negative for other MSK pain or swelling Skin: Negative for color change and worsening wound.  Neurological: Negative for tremors and numbness other than noted  Psychiatric/Behavioral: Negative for decreased concentration or agitation other than above       Objective:   Physical Exam BP 110/78 mmHg  Pulse 104  Temp(Src) 99.3 F (37.4 C) (Oral)  Wt 143 lb 4 oz (64.978 kg)  SpO2 97% VS noted, mild ill appearing, fatigued Constitutional: Pt appears well-developed, well-nourished.  HENT: Head: NCAT.  Right Ear: External ear  normal.  Left Ear: External ear normal.  Eyes: . Pupils are equal, round, and reactive to light. Conjunctivae and EOM are normal Neck: Normal range of motion. Neck supple.  Cardiovascular: Normal rate and regular rhythm.   Pulmonary/Chest: Effort normal and breath sounds normal.  Abd:  Soft, ND, + BS with mod tender LLQ without guarding or rebound Neurological: Pt is alert. Not confused , motor grossly intact Skin: Skin is warm. No rash Psychiatric: Pt behavior is normal. No agitation.     Assessment & Plan:

## 2014-04-20 NOTE — Patient Instructions (Signed)
Please take all new medication as prescribed - the antibiotic x 2 - the ciprofloxocin and flagyl, and an inhaler for breathing  You had the steroid shot for the chest wheezing, as well as the antibiotic shot for the probable intestinal infection today  Please continue all other medications as before, and refills have been done if requested.  Please have the pharmacy call with any other refills you may need.  Please keep your appointments with your specialists as you may have planned  The holidays are now started - if you are worse (pain, fever, vomiting, blood or general weakness worsening or falls) then you should go to ER immediately

## 2014-04-20 NOTE — Assessment & Plan Note (Signed)
stable overall by history and exam, recent data reviewed with pt, and pt to continue medical treatment as before,  to f/u any worsening symptoms or concerns BP Readings from Last 3 Encounters:  04/20/14 110/78  04/06/14 108/62  03/08/14 99/66

## 2014-05-12 ENCOUNTER — Ambulatory Visit (INDEPENDENT_AMBULATORY_CARE_PROVIDER_SITE_OTHER): Payer: BC Managed Care – PPO | Admitting: Internal Medicine

## 2014-05-12 ENCOUNTER — Encounter: Payer: Self-pay | Admitting: Internal Medicine

## 2014-05-12 VITALS — BP 118/82 | HR 105 | Temp 97.9°F | Ht 61.0 in | Wt 144.0 lb

## 2014-05-12 DIAGNOSIS — J029 Acute pharyngitis, unspecified: Secondary | ICD-10-CM | POA: Insufficient documentation

## 2014-05-12 DIAGNOSIS — I10 Essential (primary) hypertension: Secondary | ICD-10-CM

## 2014-05-12 MED ORDER — HYDROCODONE-HOMATROPINE 5-1.5 MG/5ML PO SYRP
5.0000 mL | ORAL_SOLUTION | Freq: Four times a day (QID) | ORAL | Status: DC | PRN
Start: 2014-05-12 — End: 2014-07-14

## 2014-05-12 MED ORDER — LEVOFLOXACIN 250 MG PO TABS: 250.0000 mg | ORAL_TABLET | Freq: Every day | ORAL | Status: DC

## 2014-05-12 NOTE — Assessment & Plan Note (Signed)
Mild to mod, for antibx course,  to f/u any worsening symptoms or concerns, no abscess at this time

## 2014-05-12 NOTE — Progress Notes (Signed)
Pre visit review using our clinic review tool, if applicable. No additional management support is needed unless otherwise documented below in the visit note. 

## 2014-05-12 NOTE — Patient Instructions (Signed)
Please take all new medication as prescribed  Please continue all other medications as before, and refills have been done if requested.  Please have the pharmacy call with any other refills you may need.  Please keep your appointments with your specialists as you may have planned     

## 2014-05-12 NOTE — Assessment & Plan Note (Signed)
stable overall by history and exam, recent data reviewed with pt, and pt to continue medical treatment as before,  to f/u any worsening symptoms or concerns BP Readings from Last 3 Encounters:  05/12/14 118/82  04/20/14 110/78  04/06/14 108/62

## 2014-05-12 NOTE — Progress Notes (Signed)
   Subjective:    [name], female    DOB: 03-11-1951, 63 y.o.   MRN: [id number]  HPI   Here with 2-3 days acute onset fever, Severe ST pressure, headache, general weakness and malaise, and greenish d/c, with and non cough, but pt denies chest pain, wheezing, increased sob or doe, orthopnea, PND, increased LE swelling, palpitations, dizziness or syncope. Past Medical History  Diagnosis Date  . ALLERGIC RHINITIS   . [name]   . ANXIETY   . ASTHMA   . DEPRESSION   . HYPERLIPIDEMIA   . COMMON MIGRAINE   . [name]   . DIVERTICULOSIS, COLON   . Mild mitral regurgitation by prior echocardiogram    Past Surgical History  Procedure Laterality Date  . Appendectomy    . Abdominal hysterectomy      reports that she has quit smoking. She does not have any smokeless tobacco history on file. She reports that she does not drink alcohol or use illicit drugs. family history includes Heart disease in her brother and father. There is no history of Colon cancer. Allergies  Allergen Reactions  . Nefazodone   . Trazodone And Nefazodone    Current Outpatient Prescriptions on File Prior to Visit  Medication Sig Dispense Refill  . amitriptyline (ELAVIL) 50 MG tablet TAKE 1 TABLET AT BEDTIME AS NEEDED FOR SLEEP. 90 tablet 3  . aspirin EC 81 MG tablet Take 1 tablet (81 mg total) by mouth daily. 90 tablet 11  . atorvastatin (LIPITOR) 10 MG tablet Take 1 tablet (10 mg total) by mouth daily. 90 tablet 3  . butalbital-acetaminophen-caffeine (ESGIC) 50-325-40 MG per tablet Take 1 tablet by mouth 2 (two) times daily as needed for headache. 60 tablet 1  . Calcium Carbonate-Vit D-Min (CALCIUM 1200 PO) Take 1 capsule by mouth daily.    . Investigational vitamin D 600 UNITS capsule SWOG [id number] Take 1,200 Units by mouth daily. Take with food.    Marland Kitchen. MINIVELLE 0.05 MG/24HR patch     . pantoprazole (PROTONIX) 40 MG tablet Take 1 tablet (40 mg total) by mouth daily. 90  tablet 3  . VESICARE 5 MG tablet Take 5 mg by mouth daily.      No current facility-administered medications on file prior to visit.     Review of Systems All otherwise neg per pt     Objective:   Physical Exam BP 118/82 mmHg  Pulse 105  Temp(Src) 97.9 F (36.6 C) (Oral)  Ht 5\' 1"  (1.549 m)  Wt 144 lb (65.318 kg)  BMI 27.22 kg/m2  SpO2 98% VS noted,  Constitutional: Pt appears well-developed, well-nourished.  HENT: Head: NCAT.  Right Ear: External ear normal.  Left Ear: External ear normal.  Eyes: . Pupils are equal, round, and reactive to light. Conjunctivae and EOM are normal Neck: Normal range of motion. Neck supple.  Cardiovascular: Normal rate and regular rhythm.   Pulmonary/Chest: Effort normal and breath sounds normal.  Bilat tm's with mild erythema.  Max sinus areas non tender.  Pharynx with severe erythema,+ left exudate Neurological: Pt is alert. Not confused , motor grossly intact Skin: Skin is warm. No rash Psychiatric: Pt behavior is normal. No agitation.     Assessment & Plan:

## 2014-07-14 ENCOUNTER — Ambulatory Visit (INDEPENDENT_AMBULATORY_CARE_PROVIDER_SITE_OTHER): Payer: BC Managed Care – PPO | Admitting: Internal Medicine

## 2014-07-14 ENCOUNTER — Encounter: Payer: Self-pay | Admitting: Internal Medicine

## 2014-07-14 VITALS — BP 130/90 | HR 90 | Temp 98.3°F | Wt 150.0 lb

## 2014-07-14 DIAGNOSIS — J4520 Mild intermittent asthma, uncomplicated: Secondary | ICD-10-CM

## 2014-07-14 DIAGNOSIS — Z0189 Encounter for other specified special examinations: Secondary | ICD-10-CM

## 2014-07-14 DIAGNOSIS — R059 Cough, unspecified: Secondary | ICD-10-CM

## 2014-07-14 DIAGNOSIS — I10 Essential (primary) hypertension: Secondary | ICD-10-CM

## 2014-07-14 DIAGNOSIS — R05 Cough: Secondary | ICD-10-CM

## 2014-07-14 DIAGNOSIS — Z0000 Encounter for general adult medical examination without abnormal findings: Secondary | ICD-10-CM

## 2014-07-14 MED ORDER — LEVOFLOXACIN 500 MG PO TABS: 500.0000 mg | ORAL_TABLET | Freq: Every day | ORAL | Status: DC

## 2014-07-14 MED ORDER — HYDROCODONE-HOMATROPINE 5-1.5 MG/5ML PO SYRP
5.0000 mL | ORAL_SOLUTION | Freq: Four times a day (QID) | ORAL | Status: DC | PRN
Start: 2014-07-14 — End: 2014-07-26

## 2014-07-14 NOTE — Assessment & Plan Note (Signed)
.  stable overall by history and exam, recent data reviewed with pt, and pt to continue medical treatment as before,  to f/u any worsening symptoms or concerns BP Readings from Last 3 Encounters:  07/14/14 130/90  05/12/14 118/82  04/20/14 110/78

## 2014-07-14 NOTE — Assessment & Plan Note (Signed)
stable overall by history and exam, recent data reviewed with pt, and pt to continue medical treatment as before,  to f/u any worsening symptoms or concerns SpO2 Readings from Last 3 Encounters:  07/14/14 96%  05/12/14 98%  04/20/14 97%

## 2014-07-14 NOTE — Assessment & Plan Note (Signed)
C/w bronchitis vs pna, declines cxr, for levaquin, cough med prn,  to f/u any worsening symptoms or concerns

## 2014-07-14 NOTE — Progress Notes (Signed)
Pre visit review using our clinic review tool, if applicable. No additional management support is needed unless otherwise documented below in the visit note. 

## 2014-07-14 NOTE — Progress Notes (Signed)
Subjective:    [name], female    DOB: 1951/03/14, 64 y.o.   MRN: [id number]  HPI  Here with acute onset mild to mod 2-3 days ST, HA, general weakness and malaise, with prod cough greenish sputum, but Pt denies chest pain, increased sob or doe, wheezing, orthopnea, PND, increased LE swelling, palpitations, dizziness or syncope. Pt denies new neurological symptoms such as new headache, or facial or extremity weakness or numbness   Pt denies polydipsia, polyuria Past Medical History  Diagnosis Date  . ALLERGIC RHINITIS   . [name]   . ANXIETY   . ASTHMA   . DEPRESSION   . HYPERLIPIDEMIA   . COMMON MIGRAINE   . [name]   . DIVERTICULOSIS, COLON   . Mild mitral regurgitation by prior echocardiogram    Past Surgical History  Procedure Laterality Date  . Appendectomy    . Abdominal hysterectomy      reports that she has quit smoking. She does not have any smokeless tobacco history on file. She reports that she does not drink alcohol or use illicit drugs. family history includes Heart disease in her brother and father. There is no history of Colon cancer. Allergies  Allergen Reactions  . Nefazodone   . Trazodone And Nefazodone    Current Outpatient Prescriptions on File Prior to Visit  Medication Sig Dispense Refill  . amitriptyline (ELAVIL) 50 MG tablet TAKE 1 TABLET AT BEDTIME AS NEEDED FOR SLEEP. 90 tablet 3  . aspirin EC 81 MG tablet Take 1 tablet (81 mg total) by mouth daily. 90 tablet 11  . atorvastatin (LIPITOR) 10 MG tablet Take 1 tablet (10 mg total) by mouth daily. 90 tablet 3  . butalbital-acetaminophen-caffeine (ESGIC) 50-325-40 MG per tablet Take 1 tablet by mouth 2 (two) times daily as needed for headache. 60 tablet 1  . Calcium Carbonate-Vit D-Min (CALCIUM 1200 PO) Take 1 capsule by mouth daily.    . Investigational vitamin D 600 UNITS capsule SWOG [id number] Take 1,200 Units by mouth daily. Take with food.    Marland Kitchen  MINIVELLE 0.05 MG/24HR patch     . pantoprazole (PROTONIX) 40 MG tablet Take 1 tablet (40 mg total) by mouth daily. 90 tablet 3  . VESICARE 5 MG tablet Take 5 mg by mouth daily.      No current facility-administered medications on file prior to visit.   Review of Systems  Constitutional: Negative for unusual diaphoresis or other sweats  HENT: Negative for ringing in ear Eyes: Negative for double vision or worsening visual disturbance.  Respiratory: Negative for choking and stridor.   Gastrointestinal: Negative for vomiting or other signifcant bowel change Genitourinary: Negative for hematuria or decreased urine volume.  Musculoskeletal: Negative for other MSK pain or swelling Skin: Negative for color change and worsening wound.  Neurological: Negative for tremors and numbness other than noted  Psychiatric/Behavioral: Negative for decreased concentration or agitation other than above       Objective:   Physical Exam BP 130/90 mmHg  Pulse 90  Temp(Src) 98.3 F (36.8 C) (Oral)  Wt 150 lb (68.04 kg)  SpO2 96% VS noted, mild ill  Constitutional: Pt appears well-developed, well-nourished.  HENT: Head: NCAT.  Right Ear: External ear normal.  Left Ear: External ear normal.  Bilat tm's with mild erythema.  Max sinus areas mild tender.  Pharynx with mild erythema, no exudate Eyes: . Pupils are equal, round, and reactive to light. Conjunctivae and EOM are normal Neck:  Normal range of motion. Neck supple.  Cardiovascular: Normal rate and regular rhythm.   Pulmonary/Chest: Effort normal and breath sounds decreased bilat without rales or wheezing.  Neurological: Pt is alert. Not confused , motor grossly intact Skin: Skin is warm. No rash Psychiatric: Pt behavior is normal. No agitation.     Assessment & Plan:

## 2014-07-14 NOTE — Patient Instructions (Addendum)
Please take all new medication as prescribed - the antibiotic, and cough medicine if needed  Please continue all other medications as before, and refills have been done if requested.  Please have the pharmacy call with any other refills you may need.  Please keep your appointments with your specialists as you may have planned  Please return in 9 months, or sooner if needed, with Lab testing done 3-5 days before

## 2014-07-15 ENCOUNTER — Telehealth: Payer: Self-pay | Admitting: Internal Medicine

## 2014-07-15 NOTE — Telephone Encounter (Signed)
emmi emailed °

## 2014-07-20 ENCOUNTER — Encounter: Payer: Self-pay | Admitting: Internal Medicine

## 2014-07-21 ENCOUNTER — Encounter: Payer: Self-pay | Admitting: Internal Medicine

## 2014-07-26 ENCOUNTER — Ambulatory Visit (INDEPENDENT_AMBULATORY_CARE_PROVIDER_SITE_OTHER): Payer: BC Managed Care – PPO | Admitting: Internal Medicine

## 2014-07-26 ENCOUNTER — Encounter: Payer: Self-pay | Admitting: Internal Medicine

## 2014-07-26 VITALS — BP 118/80 | HR 84 | Temp 97.5°F | Resp 18 | Ht 61.0 in | Wt 139.1 lb

## 2014-07-26 DIAGNOSIS — R05 Cough: Secondary | ICD-10-CM

## 2014-07-26 DIAGNOSIS — R059 Cough, unspecified: Secondary | ICD-10-CM

## 2014-07-26 DIAGNOSIS — I10 Essential (primary) hypertension: Secondary | ICD-10-CM

## 2014-07-26 DIAGNOSIS — R062 Wheezing: Secondary | ICD-10-CM

## 2014-07-26 MED ORDER — HYDROCODONE-HOMATROPINE 5-1.5 MG/5ML PO SYRP: 5.0000 mL | ORAL_SOLUTION | Freq: Four times a day (QID) | ORAL | Status: DC | PRN

## 2014-07-26 MED ORDER — AMOXICILLIN-POT CLAVULANATE 875-125 MG PO TABS: 1.0000 | ORAL_TABLET | Freq: Two times a day (BID) | ORAL | Status: DC

## 2014-07-26 MED ORDER — PREDNISONE 10 MG PO TABS: ORAL_TABLET | ORAL | Status: DC

## 2014-07-26 MED ORDER — ALBUTEROL SULFATE HFA 108 (90 BASE) MCG/ACT IN AERS: 2.0000 | INHALATION_SPRAY | Freq: Four times a day (QID) | RESPIRATORY_TRACT | Status: DC | PRN

## 2014-07-26 MED ORDER — METHYLPREDNISOLONE ACETATE 80 MG/ML IJ SUSP
80.0000 mg | Freq: Once | INTRAMUSCULAR | Status: AC
  Administered 2014-07-26: 80 mg via INTRAMUSCULAR

## 2014-07-26 NOTE — Patient Instructions (Addendum)
You had the steroid shot today  Please take all new medication as prescribed - the antibiotic, cough medicine, prednisone, and the Inhaler as needed  Please continue all other medications as before, and refills have been done if requested.  Please have the pharmacy call with any other refills you may need.  Please keep your appointments with your specialists as you may have planned  Please go to the XRAY Department in the Basement (go straight as you get off the elevator) for the x-ray testing tomorrow  You will be contacted by phone if any changes need to be made immediately.  Otherwise, you will receive a letter about your results with an explanation, but please check with MyChart first.  Please remember to sign up for MyChart if you have not done so, as this will be important to you in the future with finding out test results, communicating by private email, and scheduling acute appointments online when needed.  You are given the work note

## 2014-07-26 NOTE — Progress Notes (Signed)
Pre visit review using our clinic review tool, if applicable. No additional management support is needed unless otherwise documented below in the visit note. 

## 2014-07-26 NOTE — Progress Notes (Signed)
Subjective:    [name], female    DOB: 02/06/1951, 64 y.o.   MRN: [id number]   HPI  Here with c/o "I'm still sick" with persistent cough, no longer productive., but also still some unusual sob/doe , at least mild even walking 50 ft in from parking lot.  + wheezing , hard to take deep breaths, and Pt denies chest pain, orthopnea, PND, increased LE swelling, palpitations, dizziness or syncope. Needs work note for last fri, mon, tues.   Past Medical History  Diagnosis Date  . ALLERGIC RHINITIS   . [name]   . ANXIETY   . ASTHMA   . DEPRESSION   . HYPERLIPIDEMIA   . COMMON MIGRAINE   . [name]   . DIVERTICULOSIS, COLON   . Mild mitral regurgitation by prior echocardiogram    Past Surgical History  Procedure Laterality Date  . Appendectomy    . Abdominal hysterectomy      reports that she has quit smoking. She does not have any smokeless tobacco history on file. She reports that she does not drink alcohol or use illicit drugs. family history includes Heart disease in her brother and father. There is no history of Colon cancer. Allergies  Allergen Reactions  . Nefazodone   . Trazodone And Nefazodone    Current Outpatient Prescriptions on File Prior to Visit  Medication Sig Dispense Refill  . amitriptyline (ELAVIL) 50 MG tablet TAKE 1 TABLET AT BEDTIME AS NEEDED FOR SLEEP. 90 tablet 3  . aspirin EC 81 MG tablet Take 1 tablet (81 mg total) by mouth daily. 90 tablet 11  . atorvastatin (LIPITOR) 10 MG tablet Take 1 tablet (10 mg total) by mouth daily. 90 tablet 3  . butalbital-acetaminophen-caffeine (ESGIC) 50-325-40 MG per tablet Take 1 tablet by mouth 2 (two) times daily as needed for headache. 60 tablet 1  . Calcium Carbonate-Vit D-Min (CALCIUM 1200 PO) Take 1 capsule by mouth daily.    . Investigational vitamin D 600 UNITS capsule SWOG [id number] Take 1,200 Units by mouth daily. Take with food.    Marland Kitchen. levofloxacin (LEVAQUIN) 500 MG  tablet Take 1 tablet (500 mg total) by mouth daily. 10 tablet 0  . MINIVELLE 0.05 MG/24HR patch     . pantoprazole (PROTONIX) 40 MG tablet Take 1 tablet (40 mg total) by mouth daily. 90 tablet 3  . VESICARE 5 MG tablet Take 5 mg by mouth daily.      No current facility-administered medications on file prior to visit.   Review of Systems  Constitutional: Negative for unusual diaphoresis or other sweats  HENT: Negative for ringing in ear Eyes: Negative for double vision or worsening visual disturbance.  Respiratory: Negative for choking and stridor.   Gastrointestinal: Negative for vomiting or other signifcant bowel change Genitourinary: Negative for hematuria or decreased urine volume.  Musculoskeletal: Negative for other MSK pain or swelling Skin: Negative for color change and worsening wound.  Neurological: Negative for tremors and numbness other than noted  Psychiatric/Behavioral: Negative for decreased concentration or agitation other than above       Objective:   Physical Exam BP 118/80 mmHg  Pulse 84  Temp(Src) 97.5 F (36.4 C) (Oral)  Resp 18  Ht 5\' 1"  (1.549 m)  Wt 139 lb 1.3 oz (63.086 kg)  BMI 26.29 kg/m2  SpO2 90% ,VS noted, mild ill appearing Constitutional: Pt appears well-developed, well-nourished.  HENT: Head: NCAT.  Right Ear: External ear normal.  Left Ear: External  ear normal.  Eyes: . Pupils are equal, round, and reactive to light. Conjunctivae and EOM are normal Neck: Normal range of motion. Neck supple.  Cardiovascular: Normal rate and regular rhythm.   Pulmonary/Chest :2+ RLL rales, drecrased BS bilat/few wheezes Abd:  Soft, NT, ND, + BS Neurological: Pt is alert. Not confused , motor grossly intact Skin: Skin is warm. No rash Psychiatric: Pt behavior is normal. No agitation.     Assessment & Plan:

## 2014-07-27 ENCOUNTER — Ambulatory Visit (INDEPENDENT_AMBULATORY_CARE_PROVIDER_SITE_OTHER)
Admission: RE | Admit: 2014-07-27 | Discharge: 2014-07-27 | Disposition: A | Discharge status: Home / Self Care | Payer: BC Managed Care – PPO | Source: Ambulatory Visit | Attending: Internal Medicine | Admitting: Internal Medicine

## 2014-07-27 ENCOUNTER — Other Ambulatory Visit: Payer: Self-pay | Admitting: Internal Medicine

## 2014-07-27 DIAGNOSIS — J189 Pneumonia, unspecified organism: Secondary | ICD-10-CM

## 2014-07-27 DIAGNOSIS — R05 Cough: Secondary | ICD-10-CM

## 2014-07-27 DIAGNOSIS — R059 Cough, unspecified: Secondary | ICD-10-CM

## 2014-07-30 NOTE — Assessment & Plan Note (Signed)
stable overall by history and exam, recent data reviewed with pt, and pt to continue medical treatment as before,  to f/u any worsening symptoms or concerns BP Readings from Last 3 Encounters:  07/26/14 118/80  07/14/14 130/90  05/12/14 118/82

## 2014-07-30 NOTE — Assessment & Plan Note (Signed)
Also for albuterol MDI prn,  to f/u any worsening symptoms or concerns

## 2014-07-30 NOTE — Assessment & Plan Note (Signed)
With some intial improvement, then worsening again now with wheezing/worsening sob/doe, for f/u cxr, also depomedrol IM, predpac asd, antibx, cough med prn

## 2014-08-02 LAB — HM MAMMOGRAPHY

## 2014-08-11 ENCOUNTER — Ambulatory Visit (INDEPENDENT_AMBULATORY_CARE_PROVIDER_SITE_OTHER)
Admission: RE | Admit: 2014-08-11 | Discharge: 2014-08-11 | Disposition: A | Discharge status: Home / Self Care | Payer: BC Managed Care – PPO | Source: Ambulatory Visit | Attending: Internal Medicine | Admitting: Internal Medicine

## 2014-08-11 DIAGNOSIS — J189 Pneumonia, unspecified organism: Secondary | ICD-10-CM

## 2014-08-12 ENCOUNTER — Encounter: Payer: Self-pay | Admitting: Internal Medicine

## 2014-09-05 ENCOUNTER — Encounter: Payer: Self-pay | Admitting: Internal Medicine

## 2014-09-05 ENCOUNTER — Ambulatory Visit (INDEPENDENT_AMBULATORY_CARE_PROVIDER_SITE_OTHER): Payer: BC Managed Care – PPO | Admitting: Internal Medicine

## 2014-09-05 VITALS — BP 108/76 | HR 93 | Temp 97.9°F | Resp 16 | Ht 61.0 in | Wt 145.8 lb

## 2014-09-05 DIAGNOSIS — R938 Abnormal findings on diagnostic imaging of other specified body structures: Secondary | ICD-10-CM | POA: Diagnosis not present

## 2014-09-05 DIAGNOSIS — R9389 Abnormal findings on diagnostic imaging of other specified body structures: Secondary | ICD-10-CM

## 2014-09-05 DIAGNOSIS — R0600 Dyspnea, unspecified: Secondary | ICD-10-CM | POA: Diagnosis not present

## 2014-09-05 DIAGNOSIS — R05 Cough: Secondary | ICD-10-CM | POA: Diagnosis not present

## 2014-09-05 DIAGNOSIS — J9811 Atelectasis: Secondary | ICD-10-CM | POA: Diagnosis not present

## 2014-09-05 DIAGNOSIS — R059 Cough, unspecified: Secondary | ICD-10-CM

## 2014-09-05 NOTE — Progress Notes (Signed)
Pre visit review using our clinic review tool, if applicable. No additional management support is needed unless otherwise documented below in the visit note. 

## 2014-09-05 NOTE — Patient Instructions (Addendum)
Please  blowup at least 10  balloons a day to enhance inflation of the lungs and prevent atelectasis as we discussed.  Albuterol is a rescue inhaler which  can be used 1-2 puffs every 4 hours as needed. It may  be used 15-30 minutes before exercise if that is also  a trigger for your asthma.  The Pulmonary Function tests will be scheduled and you'll be notified of the time.Please call the Referral Co-Ordinator @ [phone] if you have not been notified of appointment time within 7-10 days.

## 2014-09-05 NOTE — Progress Notes (Signed)
   Subjective:    [name], female    DOB: Jul 28, 1950, 64 y.o.   MRN: [id number]  HPI Her records dating back to 02/24/14 were reviewed. She's been treated for "pneumonia" on several occasions. She's had serial chest x-rays including  02/24/14,04/06/14, 07/27/14, and 08/11/14. These do show linear opacities which vary in location. In November this was on the left. Follow-up films showed changes in the right lower lobe. All changes had resolved by 3/17. She has been on multiple courses of antibiotics include Levaquin as well as Augmentin.  On 4/9 she did have chest pressure and also some palpitations.  She is not having fever, chills, or sweats. She has no upper respiratory tract symptoms at this time.  She does have a rescue bronchodilator but does not use this. She denies being diagnosed with asthma. She's not had pulmonary function testing.  Cardiology evaluation 07/21/12 by Dr Elease HashimotoNahser for chest pain included normal stress ECHO.  Review of Systems At this time she does have cough with some yellow sputum which is scant. She described being "winded". This occurs walking upstairs or with light housework.  Frontal headache, facial pain , nasal purulence, dental pain, sore throat , otic pain or otic discharge denied. No fever , chills or sweats.     Objective:   Physical Exam  General appearance:Adequately nourished; no acute distress or increased work of breathing is present.    Lymphatic: No  lymphadenopathy about the head, neck, or axilla .  Eyes: No conjunctival inflammation or lid edema is present. There is no scleral icterus.  Ears:  External ear exam shows no significant lesions or deformities.  Otoscopic examination reveals clear canals, tympanic membranes are intact bilaterally without bulging, retraction, inflammation or discharge.  Nose:  External nasal examination shows no deformity or inflammation. Nasal mucosa mildly erythematous without lesions or exudates No  septal dislocation or deviation.No obstruction to airflow.   Oral exam: Dental hygiene is good; lips and gums are healthy appearing.There is no oropharyngeal erythema or exudate .  Neck:  No deformities, thyromegaly, masses, or tenderness noted.   Supple with full range of motion without pain.   Heart:  Normal rate and regular rhythm. S1 and S2 normal without gallop, murmur, click, rub or other extra sounds.   Lungs:Chest clear to auscultation; no wheezes, rhonchi,rales ,or rubs present.  Extremities:  No cyanosis, edema, or clubbing  noted    Skin: Warm & dry w/o tenting or jaundice. No significant lesions or rash.       Assessment & Plan:  #1 cough; rule out cough variant asthma  #2 recurrent atelectatic changes on x-rays in scattered distribution in lung fields  #3 exertional dyspnea; cardiac evaluation negative 06/2012  Plan: Extensive pulmonary function test will be performed to rule out asthma. Cardiology reassessment if PFTs WNL.

## 2014-09-06 DIAGNOSIS — R9389 Abnormal findings on diagnostic imaging of other specified body structures: Secondary | ICD-10-CM | POA: Insufficient documentation

## 2014-09-21 ENCOUNTER — Ambulatory Visit (INDEPENDENT_AMBULATORY_CARE_PROVIDER_SITE_OTHER): Payer: BC Managed Care – PPO | Admitting: Internal Medicine

## 2014-09-21 DIAGNOSIS — R05 Cough: Secondary | ICD-10-CM

## 2014-09-21 DIAGNOSIS — J9811 Atelectasis: Secondary | ICD-10-CM | POA: Diagnosis not present

## 2014-09-21 DIAGNOSIS — R059 Cough, unspecified: Secondary | ICD-10-CM

## 2014-09-21 LAB — PULMONARY FUNCTION TEST
DL/VA % pred: 112 %
DL/VA: 4.94 ml/min/mmHg/L
DLCO unc % pred: 88 %
DLCO unc: 17.94 ml/min/mmHg
FEF 25-75 Post: 2.63 L/sec
FEF 25-75 Pre: 2.9 L/sec
FEF2575-%CHANGE-POST: -9 %
FEF2575-%PRED-POST: 128 %
FEF2575-%Pred-Pre: 141 %
FEV1-%Change-Post: -3 %
FEV1-%Pred-Post: 91 %
FEV1-%Pred-Pre: 94 %
FEV1-PRE: 2.08 L
FEV1-Post: 2.01 L
FEV1FVC-%CHANGE-POST: -3 %
FEV1FVC-%PRED-PRE: 113 %
FEV6-%Change-Post: 0 %
FEV6-%Pred-Post: 85 %
FEV6-%Pred-Pre: 85 %
FEV6-Post: 2.35 L
FEV6-Pre: 2.36 L
FEV6FVC-%Change-Post: 0 %
FEV6FVC-%PRED-PRE: 103 %
FEV6FVC-%Pred-Post: 103 %
FVC-%CHANGE-POST: 0 %
FVC-%PRED-PRE: 83 %
FVC-%Pred-Post: 83 %
FVC-POST: 2.38 L
FVC-PRE: 2.38 L
POST FEV1/FVC RATIO: 84 %
POST FEV6/FVC RATIO: 99 %
PRE FEV6/FVC RATIO: 99 %
Pre FEV1/FVC ratio: 87 %
RV % PRED: 70 %
RV: 1.33 L
TLC % PRED: 84 %
TLC: 3.87 L

## 2014-09-21 NOTE — Progress Notes (Signed)
PFT done today. 

## 2014-10-05 ENCOUNTER — Ambulatory Visit (INDEPENDENT_AMBULATORY_CARE_PROVIDER_SITE_OTHER): Payer: BC Managed Care – PPO | Admitting: Internal Medicine

## 2014-10-05 VITALS — BP 116/80 | HR 92 | Temp 98.0°F | Ht 61.0 in | Wt 148.2 lb

## 2014-10-05 DIAGNOSIS — L03011 Cellulitis of right finger: Secondary | ICD-10-CM | POA: Diagnosis not present

## 2014-10-05 MED ORDER — AMOXICILLIN-POT CLAVULANATE 500-125 MG PO TABS: 1.0000 | ORAL_TABLET | Freq: Three times a day (TID) | ORAL | Status: DC

## 2014-10-05 MED ORDER — AMOXICILLIN-POT CLAVULANATE 500-125 MG PO TABS: 1.0000 | ORAL_TABLET | Freq: Two times a day (BID) | ORAL | Status: DC

## 2014-10-05 NOTE — Progress Notes (Signed)
Pre visit review using our clinic review tool, if applicable. No additional management support is needed unless otherwise documented below in the visit note. 

## 2014-10-05 NOTE — Progress Notes (Signed)
   Subjective:    [name], female    DOB: 1951/02/16, 64 y.o.   MRN: [id number]  HPI She was bitten by an unknown vector 10/01/14 sometime in the afternoon. She noticed there was slight pain and a localized punctate erythematous lesion which appeared slightly pustular. She's had progressive pain proximally since the initial injury. She tried to drain the lesion with an unsterile needle w/o success.  She questioned a possible spider bite. She denied abdominal pain, nausea, vomiting.  Tetanus UTD. Review of Systems  No associated itchy, watery eyes.  Swelling of the lips or tongue or intraoral lesions denied.  Shortness of breath, wheezing, or cough absent.  No vesicles or urticaria noted.  Fever ,chills , or sweats denied.   Diarrhea not present.  No dysuria, pyuria or hematuria.    Objective:   Physical Exam Pertinent or positive findings include:  There is a tiny punctate pustule near the PIP joint of the right thumb. There is no definite cellulitis extending proximally to this but there is some tenderness.  She has full range of motion of the hand with excellent strength.  Radial pulses normal.  She has no epitrochlear, axillary, or cervical lymphadenopathy on the right.  General appearance :adequately nourished; in no distress. Eyes: No conjunctival inflammation or scleral icterus is present. Heart:  Normal rate and regular rhythm. S1 and S2 normal without gallop, murmur, click, rub or other extra sounds   Lungs:Chest clear to auscultation; no wheezes, rhonchi,rales ,or rubs present.No increased work of breathing.  Vascular : all pulses equal ; no bruits present. Skin:Warm & dry.  Intact without other suspicious lesions or rashes ; no tenting or jaundice  Neuro: Strength, tone normal.       Assessment & Plan:  #1 punctate cellulitis  Plan: See orders

## 2014-10-05 NOTE — Patient Instructions (Signed)
Please report warning signs as we discussed. Worrisome would be red streaks up the extremity, increased pain, fever, or pus production. 

## 2014-10-06 ENCOUNTER — Telehealth: Payer: Self-pay | Admitting: Internal Medicine

## 2014-10-06 NOTE — Telephone Encounter (Signed)
Patient was in yesterday with a bug bite and it looks like its getting worse. Patient asked for Dr. Frederik PearHopper's student to call her.

## 2014-10-06 NOTE — Telephone Encounter (Signed)
She was inquiring as to what would be signs of concern. She has no fever, red streaks going up the arm, or purulence. There basically has been no change in her condition; certainly there's been no worsening. She's been on Augmentin less than 24 hours.

## 2014-10-06 NOTE — Telephone Encounter (Signed)
Dr. Hopper, please advise. 

## 2014-10-18 ENCOUNTER — Encounter: Payer: Self-pay | Admitting: Internal Medicine

## 2014-11-29 ENCOUNTER — Ambulatory Visit (INDEPENDENT_AMBULATORY_CARE_PROVIDER_SITE_OTHER): Payer: BC Managed Care – PPO | Admitting: Internal Medicine

## 2014-11-29 ENCOUNTER — Other Ambulatory Visit (INDEPENDENT_AMBULATORY_CARE_PROVIDER_SITE_OTHER): Payer: BC Managed Care – PPO

## 2014-11-29 ENCOUNTER — Ambulatory Visit (INDEPENDENT_AMBULATORY_CARE_PROVIDER_SITE_OTHER)
Admission: RE | Admit: 2014-11-29 | Discharge: 2014-11-29 | Disposition: A | Discharge status: Home / Self Care | Payer: BC Managed Care – PPO | Source: Ambulatory Visit | Attending: Internal Medicine | Admitting: Internal Medicine

## 2014-11-29 ENCOUNTER — Encounter: Payer: Self-pay | Admitting: Internal Medicine

## 2014-11-29 VITALS — BP 108/68 | HR 88 | Temp 98.1°F | Resp 16 | Wt 146.0 lb

## 2014-11-29 DIAGNOSIS — M79644 Pain in right finger(s): Secondary | ICD-10-CM

## 2014-11-29 DIAGNOSIS — G8929 Other chronic pain: Secondary | ICD-10-CM

## 2014-11-29 DIAGNOSIS — R4689 Other symptoms and signs involving appearance and behavior: Secondary | ICD-10-CM | POA: Insufficient documentation

## 2014-11-29 LAB — CBC WITH DIFFERENTIAL/PLATELET
Basophils Absolute: 0.1 10*3/uL (ref 0.0–0.1)
Basophils Relative: 0.8 % (ref 0.0–3.0)
EOS PCT: 2.4 % (ref 0.0–5.0)
Eosinophils Absolute: 0.2 10*3/uL (ref 0.0–0.7)
HCT: 40.9 % (ref 36.0–46.0)
Hemoglobin: 13.8 g/dL (ref 12.0–15.0)
LYMPHS PCT: 35.4 % (ref 12.0–46.0)
Lymphs Abs: 2.3 10*3/uL (ref 0.7–4.0)
MCHC: 33.7 g/dL (ref 30.0–36.0)
MCV: 86.9 fl (ref 78.0–100.0)
MONOS PCT: 8.4 % (ref 3.0–12.0)
Monocytes Absolute: 0.5 10*3/uL (ref 0.1–1.0)
NEUTROS PCT: 53 % (ref 43.0–77.0)
Neutro Abs: 3.5 10*3/uL (ref 1.4–7.7)
PLATELETS: 330 10*3/uL (ref 150.0–400.0)
RBC: 4.7 Mil/uL (ref 3.87–5.11)
RDW: 12.9 % (ref 11.5–15.5)
WBC: 6.5 10*3/uL (ref 4.0–10.5)

## 2014-11-29 LAB — SEDIMENTATION RATE: SED RATE: 25 mm/h — AB (ref 0–22)

## 2014-11-29 LAB — URIC ACID: Uric Acid, Serum: 4.4 mg/dL (ref 2.4–7.0)

## 2014-11-29 NOTE — Patient Instructions (Signed)
Use an anti-inflammatory cream such as Aspercreme or Zostrix cream twice a day to the affected area as needed. In lieu of this warm moist compresses or  hot water bottle can be used. Do not apply ice . 

## 2014-11-29 NOTE — Progress Notes (Signed)
   Subjective:    [name], female    DOB: 1951-03-01, 64 y.o.   MRN: [id number]  HPI She began to have some discomfort in the DIP joint of the right thumb 5 days ago without trigger or injury. It was similar to symptoms which began 5/7 and for which she was seen 5/11. At that time she thought she was bitten by a spider. She was placed on antibiotics which she completed. The swelling, pain, itching, & redness resolved. She now has pain and edema in this area with mild itching.  She's had a sensation of feeling "feverish" without frank constitutional symptoms. She did break out in a sweat yesterday while in air-conditioning. She's had some dry cough, sneezing, & postnasal drainage.   Review of Systems  No associated itchy, watery eyes.  Swelling of the lips or tongue denied.  Shortness of breath or wheezing absent.  No rash or urticaria noted.  Diarrhea not present.      Objective:   Physical Exam Pertinent or positive findings include: There is a tiny pale papule proximal to the thumbnail. There is minimal erythema medially at the right thumb at the DIP joint. This is tender to palpation. There is no evidence of paronychia.  General appearance :adequately nourished; in no distress.  Eyes: No conjunctival inflammation or scleral icterus is present.  Oral exam negative.  Heart:  Normal rate and regular rhythm. S1 and S2 normal without gallop, murmur, click, rub or other extra sounds    Lungs:Chest clear to auscultation; no wheezes, rhonchi,rales ,or rubs present.No increased work of breathing.   Abdomen: bowel sounds normal, soft and non-tender without masses, organomegaly or hernias noted.  No guarding or rebound.   Vascular : all pulses equal ; no bruits present.  Skin:Warm & dry.  Intact without suspicious lesions or rashes ; no tenting or jaundice   Lymphatic: No lymphadenopathy is noted about the head, neck, axilla.   Neuro: Strength, tone & DTRs  normal.           Assessment & Plan:  #1 thumb pain, recurrent over 2 months  Plan: See orders and recommendations

## 2014-11-29 NOTE — Progress Notes (Signed)
Pre visit review using our clinic review tool, if applicable. No additional management support is needed unless otherwise documented below in the visit note. 

## 2014-12-02 ENCOUNTER — Encounter: Payer: Self-pay | Admitting: Internal Medicine

## 2014-12-16 ENCOUNTER — Other Ambulatory Visit: Payer: Self-pay | Admitting: Internal Medicine

## 2014-12-16 ENCOUNTER — Telehealth: Payer: Self-pay

## 2014-12-16 DIAGNOSIS — M79644 Pain in right finger(s): Secondary | ICD-10-CM

## 2014-12-19 NOTE — Telephone Encounter (Signed)
Error

## 2015-01-16 ENCOUNTER — Other Ambulatory Visit: Payer: Self-pay | Admitting: Internal Medicine

## 2015-01-24 ENCOUNTER — Other Ambulatory Visit: Payer: Self-pay | Admitting: Internal Medicine

## 2015-01-24 NOTE — Telephone Encounter (Signed)
Script fax back to [facility]...[name]

## 2015-03-26 ENCOUNTER — Other Ambulatory Visit: Payer: Self-pay | Admitting: Internal Medicine

## 2015-06-23 ENCOUNTER — Telehealth: Payer: Self-pay | Admitting: Internal Medicine

## 2015-06-26 ENCOUNTER — Ambulatory Visit (INDEPENDENT_AMBULATORY_CARE_PROVIDER_SITE_OTHER): Payer: BC Managed Care – PPO | Admitting: Family Medicine

## 2015-06-26 ENCOUNTER — Other Ambulatory Visit: Payer: Self-pay | Admitting: Internal Medicine

## 2015-06-26 ENCOUNTER — Encounter: Payer: Self-pay | Admitting: Family Medicine

## 2015-06-26 VITALS — BP 120/70 | HR 88 | Temp 97.5°F | Wt 148.3 lb

## 2015-06-26 DIAGNOSIS — R079 Chest pain, unspecified: Secondary | ICD-10-CM

## 2015-06-26 DIAGNOSIS — K219 Gastro-esophageal reflux disease without esophagitis: Secondary | ICD-10-CM | POA: Diagnosis not present

## 2015-06-26 DIAGNOSIS — R05 Cough: Secondary | ICD-10-CM | POA: Diagnosis not present

## 2015-06-26 DIAGNOSIS — R059 Cough, unspecified: Secondary | ICD-10-CM

## 2015-06-26 MED ORDER — OMEPRAZOLE 20 MG PO CPDR: 20.0000 mg | DELAYED_RELEASE_CAPSULE | Freq: Every day | ORAL | Status: DC

## 2015-06-26 NOTE — Patient Instructions (Signed)
Please take trial of omeprazole for symptoms of acid reflux.  Also restrict triggers for reflux which includes stress, the aspirin family, alcohol, peppermint and caffeine (coffee, tea, cola, & chocolate).  The aspirin family would include aspirin, and the nonsteroidal agents such as ibuprofen, and naproxen.  Tylenol would not cause reflux.  If having symptoms, food and drink should be avoided for at least 2 hours before going to bed.   If you notice symptoms of chest pain, shortness of breath, pain that radiates down back, jaw, arm, dizziness, heart racing, or palpitations, seek medical attention immediately. Please follow up with PCP to evaluate response to medication or further evaluation of symptoms.  If these symptoms do not improve with treatment or worsen, follow up with clinic for further evaluation.  Gastroesophageal Reflux Disease, Adult Normally, food travels down the esophagus and stays in the stomach to be digested. However, when a person has gastroesophageal reflux disease (GERD), food and stomach acid move back up into the esophagus. When this happens, the esophagus becomes sore and inflamed. Over time, GERD can create small holes (ulcers) in the lining of the esophagus.  CAUSES This condition is caused by a problem with the muscle between the esophagus and the stomach (lower esophageal sphincter, or LES). Normally, the LES muscle closes after food passes through the esophagus to the stomach. When the LES is weakened or abnormal, it does not close properly, and that allows food and stomach acid to go back up into the esophagus. The LES can be weakened by certain dietary substances, medicines, and medical conditions, including:  Tobacco use.  Pregnancy.  Having a hiatal hernia.  Heavy alcohol use.  Certain foods and beverages, such as coffee, chocolate, onions, and peppermint. RISK FACTORS This condition is more likely to develop in:  People who have an increased body  weight.  People who have connective tissue disorders.  People who use NSAID medicines. SYMPTOMS Symptoms of this condition include:  Heartburn.  Difficult or painful swallowing.  The feeling of having a lump in the throat.  Abitter taste in the mouth.  Bad breath.  Having a large amount of saliva.  Having an upset or bloated stomach.  Belching.  Chest pain.  Shortness of breath or wheezing.  Ongoing (chronic) cough or a night-time cough.  Wearing away of tooth enamel.  Weight loss. Different conditions can cause chest pain. Make sure to see your health care provider if you experience chest pain. DIAGNOSIS Your health care provider will take a medical history and perform a physical exam. To determine if you have mild or severe GERD, your health care provider may also monitor how you respond to treatment. You may also have other tests, including:  An endoscopy toexamine your stomach and esophagus with a small camera.  A test thatmeasures the acidity level in your esophagus.  A test thatmeasures how much pressure is on your esophagus.  A barium swallow or modified barium swallow to show the shape, size, and functioning of your esophagus. TREATMENT The goal of treatment is to help relieve your symptoms and to prevent complications. Treatment for this condition may vary depending on how severe your symptoms are. Your health care provider may recommend:  Changes to your diet.  Medicine.  Surgery. HOME CARE INSTRUCTIONS Diet  Follow a diet as recommended by your health care provider. This may involve avoiding foods and drinks such as:  Coffee and tea (with or without caffeine).  Drinks that containalcohol.  Energy drinks and sports  drinks.  Carbonated drinks or sodas.  Chocolate and cocoa.  Peppermint and mint flavorings.  Garlic and onions.  Horseradish.  Spicy and acidic foods, including peppers, chili powder, curry powder, vinegar, hot sauces,  and barbecue sauce.  Citrus fruit juices and citrus fruits, such as oranges, lemons, and limes.  Tomato-based foods, such as red sauce, chili, salsa, and pizza with red sauce.  Fried and fatty foods, such as donuts, french fries, potato chips, and high-fat dressings.  High-fat meats, such as hot dogs and fatty cuts of red and white meats, such as rib eye steak, sausage, ham, and bacon.  High-fat dairy items, such as whole milk, butter, and cream cheese.  Eat small, frequent meals instead of large meals.  Avoid drinking large amounts of liquid with your meals.  Avoid eating meals during the 2-3 hours before bedtime.  Avoid lying down right after you eat.  Do not exercise right after you eat. General Instructions  Pay attention to any changes in your symptoms.  Take over-the-counter and prescription medicines only as told by your health care provider. Do not take aspirin, ibuprofen, or other NSAIDs unless your health care provider told you to do so.  Do not use any tobacco products, including cigarettes, chewing tobacco, and e-cigarettes. If you need help quitting, ask your health care provider.  Wear loose-fitting clothing. Do not wear anything tight around your waist that causes pressure on your abdomen.  Raise (elevate) the head of your bed 6 inches (15cm).  Try to reduce your stress, such as with yoga or meditation. If you need help reducing stress, ask your health care provider.  If you are overweight, reduce your weight to an amount that is healthy for you. Ask your health care provider for guidance about a safe weight loss goal.  Keep all follow-up visits as told by your health care provider. This is important. SEEK MEDICAL CARE IF:  You have new symptoms.  You have unexplained weight loss.  You have difficulty swallowing, or it hurts to swallow.  You have wheezing or a persistent cough.  Your symptoms do not improve with treatment.  You have a hoarse  voice. SEEK IMMEDIATE MEDICAL CARE IF:  You have pain in your arms, neck, jaw, teeth, or back.  You feel sweaty, dizzy, or light-headed.  You have chest pain or shortness of breath.  You vomit and your vomit looks like blood or coffee grounds.  You faint.  Your stool is bloody or black.  You cannot swallow, drink, or eat.   This information is not intended to replace advice given to you by your health care provider. Make sure you discuss any questions you have with your health care provider.   Document Released: 02/20/2005 Document Revised: 02/01/2015 Document Reviewed: 09/07/2014 Elsevier Interactive Patient Education [employer]

## 2015-06-26 NOTE — Progress Notes (Signed)
Subjective:    [name], female    DOB: 10-Sep-1950, 65 y.o.   MRN: [id number]  HPI [name] is a 65 year old female who presents today with acid reflux, history of chest pain, and non productive cough.  She reports a history of waking up with chest pain and "acid feeling" 3 nights ago which resolved spontaneously. She states that the pain was rated as a 9-10 and sharp but is unable to recall how long the pain lasted.  Prior to going to bed she ate pizza and stated that she chose not to go to the ED for the pain since she felt "acid coming up". Associated symptoms of fatigue have been noted with DOE when climbing stairs holding her granddaughter. Pertinent history includes history of cardiac issues with brother at age 57, father at age 37 and grandfather at age 72. No other episodes of chest pain have occurred and symptoms have resolved today with only mild "acid feeling" noted. Patient has a history of HRT which she discontinued 1 year ago due to cost. No calf/thigh pain, orthopnea, or pleuritic pain noted.   Review of Systems  Constitutional: Positive for fatigue. Negative for fever and chills.  HENT: Negative.   Respiratory: Positive for cough. Negative for chest tightness.        Noted DOE when carrying 73 month old granddaughter upstairs. She has no DOE when walking dog today.  Cardiovascular: Negative for palpitations and leg swelling.       Unspecified chest pain episode 3 nights ago, no chest pain at this time.  Gastrointestinal: Negative for nausea, vomiting, diarrhea and blood in stool.       Patient notes "acid feeling" after eating and lying down  Musculoskeletal: Negative for myalgias and arthralgias.  Skin: Negative for color change.  Neurological: Negative for dizziness and light-headedness.   Past Medical History  Diagnosis Date  . ALLERGIC RHINITIS   . [name]   . ANXIETY   . ASTHMA   . DEPRESSION   . HYPERLIPIDEMIA   . COMMON MIGRAINE     . [name]   . DIVERTICULOSIS, COLON   . Mild mitral regurgitation by prior echocardiogram     Social History   Social History  . Marital Status: Single    Spouse Name: N/A  . Number of Children: N/A  . Years of Education: N/A   Occupational History  . Not on file.   Social History Main Topics  . Smoking status: Former Games developer  . Smokeless tobacco: Not on file  . Alcohol Use: No     Comment: occasional   . Drug Use: No  . Sexual Activity: Not on file   Other Topics Concern  . Not on file   Social History Narrative   Occasional  Caffeine drinker     Past Surgical History  Procedure Laterality Date  . Appendectomy    . Abdominal hysterectomy      Family History  Problem Relation Age of Onset  . Heart disease Father   . Heart disease Brother   . Colon cancer Neg Hx     Allergies  Allergen Reactions  . Nefazodone   . Trazodone And Nefazodone     Current Outpatient Prescriptions on File Prior to Visit  Medication Sig Dispense Refill  . amitriptyline (ELAVIL) 50 MG tablet TAKE 1 TABLET AT BEDTIME AS NEEDED FOR SLEEP. 90 tablet 0  . aspirin EC 81 MG tablet Take 1 tablet (81  mg total) by mouth daily. 90 tablet 11  . atorvastatin (LIPITOR) 10 MG tablet TAKE 1 TABLET ONCE DAILY. 90 tablet 0  . butalbital-acetaminophen-caffeine (FIORICET, ESGIC) 50-325-40 MG per tablet TAKE 1 TABLET TWICE DAILY AS NEEDED FOR HEADACHE. 60 tablet 0  . Calcium Carbonate-Vit D-Min (CALCIUM 1200 PO) Take 1 capsule by mouth daily.    . Investigational vitamin D 600 UNITS capsule SWOG [id number] Take 1,200 Units by mouth daily. Take with food.    Marland Kitchen MINIVELLE 0.05 MG/24HR patch      No current facility-administered medications on file prior to visit.    BP 120/70 mmHg  Pulse 88  Temp(Src) 97.5 F (36.4 C) (Oral)  Wt 148 lb 4.8 oz (67.268 kg)  SpO2 96%       Objective:   Physical Exam  Constitutional: She is oriented to person, place, and time. She appears  well-developed and well-nourished.  HENT:  Right Ear: Tympanic membrane normal.  Left Ear: Tympanic membrane normal.  Cardiovascular: Normal rate, regular rhythm and normal heart sounds.   Pulmonary/Chest: Effort normal and breath sounds normal. She has no wheezes. She has no rales.  Abdominal: Soft. There is no tenderness.  Lymphadenopathy:    She has no cervical adenopathy.  Neurological: She is alert and oriented to person, place, and time.  Skin: Skin is warm and dry.          Assessment & Plan:  EKG performed and reviewed. Normal sinus rhythm noted. History and exam findings support GERD. Trial of omeprazole with dietary changes will be implemented.  Avoid triggers for reflux which includes stress, the aspirin family, alcohol, peppermint and caffeine (coffee, tea, cola, & chocolate).  The aspirin family would include aspirin, and the nonsteroidal agents such as ibuprofen, and naproxen.  Tylenol would not cause reflux. Patient advised to avoid food and drink for at least 2 hours before going to bed if experiencing symptoms.  Instructed patient regarding symptoms to be aware of including chest pain, shortness of breath, pain that radiates down back, jaw, arm, dizziness, heart racing, or palpitations. She was advised to seek medical attention immediately for these symptoms. Encouraged patient to  follow up with PCP to evaluate response to medication or further evaluation of symptoms. She is due for a cpe and also yearly gyn visit. Discussed the possibility of a stress test if PCP feels this is necessary after evaluation. Patient voiced understanding of plan and agreed to contact clinic for symptoms that  do not improve with treatment or worsen. She also agreed to make an appointment for cpe and gyn exam.

## 2015-06-26 NOTE — Progress Notes (Signed)
Pre visit review using our clinic review tool, if applicable. No additional management support is needed unless otherwise documented below in the visit note. 

## 2015-06-29 ENCOUNTER — Ambulatory Visit: Payer: BC Managed Care – PPO | Admitting: Internal Medicine

## 2015-08-02 ENCOUNTER — Encounter: Payer: Self-pay | Admitting: Nurse Practitioner

## 2015-08-02 ENCOUNTER — Ambulatory Visit: Payer: BC Managed Care – PPO | Admitting: Nurse Practitioner

## 2015-08-02 ENCOUNTER — Ambulatory Visit (INDEPENDENT_AMBULATORY_CARE_PROVIDER_SITE_OTHER): Payer: BC Managed Care – PPO | Admitting: Nurse Practitioner

## 2015-08-02 VITALS — BP 120/72 | HR 95 | Temp 98.8°F | Wt 148.0 lb

## 2015-08-02 DIAGNOSIS — R05 Cough: Secondary | ICD-10-CM

## 2015-08-02 DIAGNOSIS — R059 Cough, unspecified: Secondary | ICD-10-CM

## 2015-08-02 MED ORDER — AZITHROMYCIN 250 MG PO TABS: ORAL_TABLET | ORAL | Status: DC

## 2015-08-02 NOTE — Assessment & Plan Note (Signed)
New onset Will treat conservatively due to probable viral nature Advised Mucinex plain and benadryl at night encouraged  Antibiotic prescription printed in case of no improvement or fever within 48 hours. Pt verbalized understanding of need to hold.  FU prn worsening/failure to improve.

## 2015-08-02 NOTE — Progress Notes (Signed)
[name], female    DOB: August 05, 1950  Age: 65 y.o. MRN: [id number]  CC: Chest Pain; Cough; and Sinusitis   HPI [name] presents for CC of cough x 2 days dry and sinusitis x 5 days.   1) Pt reports dry cough x 2 days Sinus pressure with drainage x 5 days [name], [name] Chest tightness across front/back of chest Tmax 99.3 [name], chest pain, cough chronic Occasionally bloody drainage   Treatment to date: Low dosage ASA Allegra generic 24 hr   Treatment to date: Granddaughter- 8 mos   History [name] has a past medical history of ALLERGIC RHINITIS; [name]; ANXIETY; ASTHMA; DEPRESSION; HYPERLIPIDEMIA; COMMON MIGRAINE; [name]; DIVERTICULOSIS, COLON; [name]   She has past surgical history that includes Appendectomy and Abdominal hysterectomy.   Her family history includes Heart disease in her brother and father. There is no history of Colon cancer.She reports that she has quit smoking. She does not have any smokeless tobacco history on file. She reports that she does not drink alcohol or use illicit drugs.  Outpatient Prescriptions Prior to Visit  Medication Sig Dispense Refill  . amitriptyline (ELAVIL) 50 MG tablet TAKE 1 TABLET AT BEDTIME AS NEEDED FOR SLEEP. 90 tablet 0  . aspirin EC 81 MG tablet Take 1 tablet (81 mg total) by mouth daily. 90 tablet 11  . atorvastatin (LIPITOR) 10 MG tablet TAKE 1 TABLET ONCE DAILY. 90 tablet 0  . butalbital-acetaminophen-caffeine (FIORICET, ESGIC) 50-325-40 MG per tablet TAKE 1 TABLET TWICE DAILY AS NEEDED FOR HEADACHE. 60 tablet 0  . Calcium Carbonate-Vit D-Min (CALCIUM 1200 PO) Take 1 capsule by mouth daily.    . Investigational vitamin D 600 UNITS capsule SWOG [id number] Take 1,200 Units by mouth daily. Take with food.    Marland Kitchen MINIVELLE 0.05 MG/24HR patch     . omeprazole (PRILOSEC) 20 MG capsule Take 1  capsule (20 mg total) by mouth daily. 30 capsule 1   No facility-administered medications prior to visit.    ROS Review of Systems  Constitutional: Positive for fatigue. Negative for fever, chills and diaphoresis.  HENT: Positive for congestion, ear pain, postnasal drip, rhinorrhea, sinus pressure and sore throat. Negative for trouble swallowing and voice change.   Eyes: Negative for visual disturbance.  Respiratory: Positive for cough. Negative for chest tightness, shortness of breath and wheezing.   Cardiovascular: Negative for chest pain, palpitations and leg swelling.  Gastrointestinal: Negative for nausea, vomiting and diarrhea.  Skin: Negative for rash.  Neurological: Negative for dizziness and headaches.    Objective:  BP 120/72 mmHg  Pulse 95  Temp(Src) 98.8 F (37.1 C) (Oral)  Wt 148 lb (67.132 kg)  SpO2 94%  Physical Exam  Constitutional: She is oriented to person, place, and time. She appears well-developed and well-nourished. No distress.  HENT:  Head: Normocephalic and atraumatic.  Right Ear: External ear normal.  Left Ear: External ear normal.  Mouth/Throat: Oropharynx is clear and moist. No oropharyngeal exudate.  TM's blocked by cerumen at first After bilateral irrigation by CMA- clear TMs without retraction or bulging  Eyes: EOM are normal. Pupils are equal, round, and reactive to light. Right eye exhibits no discharge. Left eye exhibits no discharge. No scleral icterus.  Neck: Normal range of motion. Neck supple.  Cardiovascular: Normal rate, regular rhythm and normal heart sounds.  Exam reveals no gallop and no friction rub.  No murmur heard. Pulmonary/Chest: Effort normal and breath sounds normal. No respiratory distress. She has no wheezes. She has no rales. She exhibits no tenderness.  Lymphadenopathy:    She has no cervical adenopathy.  Neurological: She is alert and oriented to person, place, and time. No cranial nerve deficit. She exhibits normal  muscle tone. Coordination normal.  Skin: Skin is warm and dry. No rash noted. She is not diaphoretic.  Psychiatric: She has a normal mood and affect. Her behavior is normal. Judgment and thought content normal.   Assessment & Plan:   [name] was seen today for chest pain, cough and sinusitis.  Diagnoses and all orders for this visit:  Cough  Other orders -     azithromycin (ZITHROMAX) 250 MG tablet; Take 2 tablets by mouth on day 1, take 1 tablet by mouth each day after for 4 days.  I am having [name] start on azithromycin. I am also having her maintain her Investigational vitamin D, Calcium Carbonate-Vit D-Min (CALCIUM 1200 PO), MINIVELLE, aspirin EC, butalbital-acetaminophen-caffeine, omeprazole, amitriptyline, atorvastatin, and tolterodine.  Meds ordered this encounter  Medications  . tolterodine (DETROL LA) 2 MG 24 hr capsule    Sig: Take 1 capsule by mouth daily.   Marland Kitchen. azithromycin (ZITHROMAX) 250 MG tablet    Sig: Take 2 tablets by mouth on day 1, take 1 tablet by mouth each day after for 4 days.    Dispense:  6 each    Refill:  0    Order Specific Question:  Supervising Provider    Answer:  Sherlene ShamsULLO, TERESA L [[id number]]     Follow-up: Return if symptoms worsen or fail to improve.

## 2015-08-02 NOTE — Patient Instructions (Signed)
You have a viral syndrome which is causing sinus congestion. The post nasal drip may also be contributing to your cough.  I also advise use of the following OTC meds to help with your other symptoms.   Take generic OTC mucinex for drainage and Delsym for daytime cough.  If the coughing has not impoved in  48 hours  OR  if you develop Temp > 100.4,  Green nasal discharge, or facial pain, start the antibiotic.  Please take a probiotic ( Align, Floraque or Culturelle) while you are on the antibiotic to prevent a serious antibiotic associated diarrhea. Generic is okay

## 2015-08-02 NOTE — Progress Notes (Signed)
Pre visit review using our clinic review tool, if applicable. No additional management support is needed unless otherwise documented below in the visit note. 

## 2015-09-20 ENCOUNTER — Other Ambulatory Visit: Payer: Self-pay | Admitting: Internal Medicine

## 2015-09-25 ENCOUNTER — Other Ambulatory Visit: Payer: Self-pay | Admitting: Internal Medicine

## 2015-09-26 ENCOUNTER — Other Ambulatory Visit (INDEPENDENT_AMBULATORY_CARE_PROVIDER_SITE_OTHER): Payer: BC Managed Care – PPO

## 2015-09-26 ENCOUNTER — Telehealth: Payer: Self-pay

## 2015-09-26 ENCOUNTER — Ambulatory Visit (INDEPENDENT_AMBULATORY_CARE_PROVIDER_SITE_OTHER): Payer: BC Managed Care – PPO | Admitting: Internal Medicine

## 2015-09-26 ENCOUNTER — Encounter: Payer: Self-pay | Admitting: Internal Medicine

## 2015-09-26 VITALS — BP 126/78 | HR 98 | Temp 98.5°F | Resp 20 | Wt 147.0 lb

## 2015-09-26 DIAGNOSIS — R6889 Other general symptoms and signs: Secondary | ICD-10-CM

## 2015-09-26 DIAGNOSIS — Z0001 Encounter for general adult medical examination with abnormal findings: Secondary | ICD-10-CM

## 2015-09-26 DIAGNOSIS — Z1159 Encounter for screening for other viral diseases: Secondary | ICD-10-CM

## 2015-09-26 DIAGNOSIS — E162 Hypoglycemia, unspecified: Secondary | ICD-10-CM

## 2015-09-26 DIAGNOSIS — G43009 Migraine without aura, not intractable, without status migrainosus: Secondary | ICD-10-CM

## 2015-09-26 DIAGNOSIS — M545 Low back pain, unspecified: Secondary | ICD-10-CM

## 2015-09-26 DIAGNOSIS — K219 Gastro-esophageal reflux disease without esophagitis: Secondary | ICD-10-CM | POA: Insufficient documentation

## 2015-09-26 DIAGNOSIS — I10 Essential (primary) hypertension: Secondary | ICD-10-CM

## 2015-09-26 DIAGNOSIS — R7989 Other specified abnormal findings of blood chemistry: Secondary | ICD-10-CM

## 2015-09-26 DIAGNOSIS — E785 Hyperlipidemia, unspecified: Secondary | ICD-10-CM

## 2015-09-26 HISTORY — DX: Gastro-esophageal reflux disease without esophagitis: K21.9

## 2015-09-26 LAB — CBC WITH DIFFERENTIAL/PLATELET
BASOS ABS: 0.1 10*3/uL (ref 0.0–0.1)
BASOS PCT: 0.7 % (ref 0.0–3.0)
Eosinophils Absolute: 0.2 10*3/uL (ref 0.0–0.7)
Eosinophils Relative: 2.8 % (ref 0.0–5.0)
HEMATOCRIT: 40.6 % (ref 36.0–46.0)
Hemoglobin: 13.9 g/dL (ref 12.0–15.0)
LYMPHS ABS: 2.8 10*3/uL (ref 0.7–4.0)
LYMPHS PCT: 35.2 % (ref 12.0–46.0)
MCHC: 34.1 g/dL (ref 30.0–36.0)
MCV: 85.1 fl (ref 78.0–100.0)
Monocytes Absolute: 0.5 10*3/uL (ref 0.1–1.0)
Monocytes Relative: 6 % (ref 3.0–12.0)
NEUTROS ABS: 4.3 10*3/uL (ref 1.4–7.7)
NEUTROS PCT: 55.3 % (ref 43.0–77.0)
PLATELETS: 367 10*3/uL (ref 150.0–400.0)
RBC: 4.78 Mil/uL (ref 3.87–5.11)
RDW: 13.4 % (ref 11.5–15.5)
WBC: 7.8 10*3/uL (ref 4.0–10.5)

## 2015-09-26 LAB — URINALYSIS, ROUTINE W REFLEX MICROSCOPIC
Hgb urine dipstick: NEGATIVE
KETONES UR: 15 — AB
LEUKOCYTES UA: NEGATIVE
Nitrite: NEGATIVE
PH: 5.5 (ref 5.0–8.0)
URINE GLUCOSE: NEGATIVE
UROBILINOGEN UA: 0.2 (ref 0.0–1.0)

## 2015-09-26 LAB — HEPATIC FUNCTION PANEL
ALBUMIN: 4.3 g/dL (ref 3.5–5.2)
ALT: 22 U/L (ref 0–35)
AST: 18 U/L (ref 0–37)
Alkaline Phosphatase: 102 U/L (ref 39–117)
Bilirubin, Direct: 0 mg/dL (ref 0.0–0.3)
Total Bilirubin: 0.4 mg/dL (ref 0.2–1.2)
Total Protein: 7.2 g/dL (ref 6.0–8.3)

## 2015-09-26 LAB — BASIC METABOLIC PANEL
BUN: 8 mg/dL (ref 6–23)
CO2: 27 mEq/L (ref 19–32)
Calcium: 9.4 mg/dL (ref 8.4–10.5)
Chloride: 107 mEq/L (ref 96–112)
Creatinine, Ser: 0.9 mg/dL (ref 0.40–1.20)
GFR: 66.99 mL/min (ref >60.00)
GLUCOSE: 122 mg/dL — AB (ref 70–99)
Potassium: 4.2 mEq/L (ref 3.5–5.1)
Sodium: 142 mEq/L (ref 135–145)

## 2015-09-26 LAB — TSH: TSH: 3.12 u[IU]/mL (ref 0.35–4.50)

## 2015-09-26 LAB — LDL CHOLESTEROL, DIRECT: Direct LDL: 101 mg/dL

## 2015-09-26 LAB — LIPID PANEL
CHOLESTEROL: 189 mg/dL (ref 0–200)
HDL: 62.3 mg/dL (ref >39.00)
NonHDL: 127.05
TRIGLYCERIDES: 357 mg/dL — AB (ref 0.0–149.0)
Total CHOL/HDL Ratio: 3
VLDL: 71.4 mg/dL — AB (ref 0.0–40.0)

## 2015-09-26 LAB — HEMOGLOBIN A1C: Hgb A1c MFr Bld: 6 % (ref 4.6–6.5)

## 2015-09-26 MED ORDER — BUTALBITAL-APAP-CAFFEINE 50-325-40 MG PO TABS: ORAL_TABLET | ORAL | Status: DC

## 2015-09-26 MED ORDER — OMEPRAZOLE 20 MG PO CPDR: 20.0000 mg | DELAYED_RELEASE_CAPSULE | Freq: Every day | ORAL | Status: DC

## 2015-09-26 MED ORDER — TRAMADOL HCL 50 MG PO TABS: 50.0000 mg | ORAL_TABLET | Freq: Three times a day (TID) | ORAL | Status: DC | PRN

## 2015-09-26 MED ORDER — CYCLOBENZAPRINE HCL 5 MG PO TABS: 5.0000 mg | ORAL_TABLET | Freq: Three times a day (TID) | ORAL | Status: DC | PRN

## 2015-09-26 NOTE — Patient Instructions (Signed)
Please take all new medication as prescribed - the pain medication, muscle relaxer as needed, and the prilosec  Please continue all other medications as before, and refills have been done if requested - the migraine medication  Please have the pharmacy call with any other refills you may need.  Please continue your efforts at being more active, low cholesterol diet, and weight control.  You are otherwise up to date with prevention measures today.  Please keep your appointments with your specialists as you may have planned  Please go to the LAB in the Basement (turn left off the elevator) for the tests to be done today  You will be contacted by phone if any changes need to be made immediately.  Otherwise, you will receive a letter about your results with an explanation, but please check with MyChart first.  Please remember to sign up for MyChart if you have not done so, as this will be important to you in the future with finding out test results, communicating by private email, and scheduling acute appointments online when needed.  Please return in 1 year for your yearly visit, or sooner if needed, with Lab testing done 3-5 days before

## 2015-09-26 NOTE — Assessment & Plan Note (Signed)
Worsening symptom wise, no red flags, for re-start PPI, follow anti reflux precautions

## 2015-09-26 NOTE — Assessment & Plan Note (Signed)

## 2015-09-26 NOTE — Telephone Encounter (Signed)
Order placed for add on

## 2015-09-26 NOTE — Progress Notes (Signed)
Pre visit review using our clinic review tool, if applicable. No additional management support is needed unless otherwise documented below in the visit note. 

## 2015-09-26 NOTE — Assessment & Plan Note (Signed)
stable overall by history and exam, recent data reviewed with pt, and pt to continue medical treatment as before,  to f/u any worsening symptoms or concerns Lab Results  Component Value Date   LDLCALC 68 07/21/2012   For f/u lab

## 2015-09-26 NOTE — Assessment & Plan Note (Signed)
C/w msk strain, mod to severe, for tramadol prn, flexeril prn,  to f/u any worsening symptoms or concerns, does not appear to need imaging at this time

## 2015-09-26 NOTE — Assessment & Plan Note (Signed)
stable overall by history and exam,  and pt to continue medical treatment as before,  to f/u any worsening symptoms or concerns, for med refill 

## 2015-09-26 NOTE — Progress Notes (Signed)
Subjective:    [name], female    DOB: 12/14/1950, 65 y.o.   MRN: [id number]  HPI  Here for wellness and f/u;  Overall doing ok;  Pt denies Chest pain, worsening SOB, DOE, wheezing, orthopnea, PND, worsening LE edema, palpitations, dizziness or syncope.  Pt denies neurological change such as new headache and migraines have remained stable in freq and severity ,no facial or extremity weakness.  Pt denies polydipsia, polyuria, or low sugar symptoms. Pt states overall good compliance with treatment and medications, good tolerability, and has been trying to follow appropriate diet.  Pt denies worsening depressive symptoms, suicidal ideation or panic. No fever, night sweats, wt loss, loss of appetite, or other constitutional symptoms.  Pt states good ability with ADL's, has low fall risk, home safety reviewed and adequate, no other significant changes in hearing or vision, and only occasionally active with exercise. Wt Readings from Last 3 Encounters:  09/26/15 147 lb (66.679 kg)  08/02/15 148 lb (67.132 kg)  06/26/15 148 lb 4.8 oz (67.268 kg)  Pt continues to have 5 days left lower back LBP with gradual subjective worsening, sometimes 3/10 with sitting still, then 8/10 with movement, worse to bend and twist or standing, though walking is not so bad,but no bowel or bladder change, fever, wt loss,  worsening LE pain/numbness/weakness, gait change or falls. No prior hx of same, no prior films, MRI, surgury.  Not sure why started, wakes her up at night to turn over.  Denies urinary symptoms such as dysuria, frequency, urgency, flank pain, hematuria or n/v, fever, chills.  Has had mild worsening reflux, but no abd pain, dysphagia, n/v, bowel change or blood. Keeps her 82 mo old granddaughter 3 days per wk, with some lifting more lately.  Had more pain late last wk, used her migraine med for back pain once.  Past Medical History  Diagnosis Date  . ALLERGIC RHINITIS   . [name]     . ANXIETY   . ASTHMA   . DEPRESSION   . HYPERLIPIDEMIA   . COMMON MIGRAINE   . [name]   . DIVERTICULOSIS, COLON   . Mild mitral regurgitation by prior echocardiogram    Past Surgical History  Procedure Laterality Date  . Appendectomy    . Abdominal hysterectomy      reports that she has quit smoking. She does not have any smokeless tobacco history on file. She reports that she does not drink alcohol or use illicit drugs. family history includes Heart disease in her brother and father. There is no history of Colon cancer. Allergies  Allergen Reactions  . Nefazodone   . Trazodone And Nefazodone    Current Outpatient Prescriptions on File Prior to Visit  Medication Sig Dispense Refill  . amitriptyline (ELAVIL) 50 MG tablet TAKE 1 TABLET AT BEDTIME AS NEEDED FOR SLEEP. 90 tablet 0  . aspirin EC 81 MG tablet Take 1 tablet (81 mg total) by mouth daily. 90 tablet 11  . atorvastatin (LIPITOR) 10 MG tablet TAKE 1 TABLET ONCE DAILY. 90 tablet 0  . Calcium Carbonate-Vit D-Min (CALCIUM 1200 PO) Take 1 capsule by mouth daily.    . Investigational vitamin D 600 UNITS capsule SWOG [id number] Take 1,200 Units by mouth daily. Take with food.    Marland Kitchen MINIVELLE 0.05 MG/24HR patch     . tolterodine (DETROL LA) 2 MG 24 hr capsule Take 1 capsule by mouth daily.      No current facility-administered medications  on file prior to visit.   Review of Systems Constitutional: Negative for increased diaphoresis, or other activity, appetite or siginficant weight change other than noted HENT: Negative for worsening hearing loss, ear pain, facial swelling, mouth sores and neck stiffness.   Eyes: Negative for other worsening pain, redness or visual disturbance.  Respiratory: Negative for choking or stridor Cardiovascular: Negative for other chest pain and palpitations.  Gastrointestinal: Negative for worsening diarrhea, blood in stool, or abdominal distention Genitourinary: Negative for  hematuria, flank pain or change in urine volume.  Musculoskeletal: Negative for myalgias or other joint complaints.  Skin: Negative for other color change and wound or drainage.  Neurological: Negative for syncope and numbness. other than noted Hematological: Negative for adenopathy. or other swelling Psychiatric/Behavioral: Negative for hallucinations, SI, self-injury, decreased concentration or other worsening agitation.      Objective:   Physical Exam BP 126/78 mmHg  Pulse 98  Temp(Src) 98.5 F (36.9 C) (Oral)  Resp 20  Wt 147 lb (66.679 kg)  SpO2 94% VS noted,  Constitutional: Pt is oriented to person, place, and time. Appears well-developed and well-nourished, in no significant distress Head: Normocephalic and atraumatic  Eyes: Conjunctivae and EOM are normal. Pupils are equal, round, and reactive to light Right Ear: External ear normal.  Left Ear: External ear normal Nose: Nose normal.  Mouth/Throat: Oropharynx is clear and moist  Neck: Normal range of motion. Neck supple. No JVD present. No tracheal deviation present or significant neck LA or mass Cardiovascular: Normal rate, regular rhythm, normal heart sounds and intact distal pulses.   Pulmonary/Chest: Effort normal and breath sounds without rales or wheezing  Abdominal: Soft. Bowel sounds are normal. NT. No HSM  Musculoskeletal: Normal range of motion. Exhibits no edema Lymphadenopathy: Has no cervical adenopathy.  Neurological: Pt is alert and oriented to person, place, and time. Pt has normal reflexes. No cranial nerve deficit. Motor 5/5 intact, sens/dtr intact Skin: Skin is warm and dry. No rash noted or new ulcers Psychiatric:  Has normal mood and affect. Behavior is normal.  Spine nontender + marked left paravertebral tender with spasm noted    Assessment & Plan:

## 2015-09-26 NOTE — Telephone Encounter (Signed)
Refill sent to pharmacy.   

## 2015-09-26 NOTE — Assessment & Plan Note (Signed)
stable overall by history and exam, recent data reviewed with pt, and pt to continue medical treatment as before,  to f/u any worsening symptoms or concerns BP Readings from Last 3 Encounters:  09/26/15 126/78  08/02/15 120/72  06/26/15 120/70

## 2015-09-27 LAB — HEPATITIS C ANTIBODY: HCV Ab: NEGATIVE

## 2015-12-04 ENCOUNTER — Ambulatory Visit (INDEPENDENT_AMBULATORY_CARE_PROVIDER_SITE_OTHER): Payer: BC Managed Care – PPO | Admitting: Family Medicine

## 2015-12-04 ENCOUNTER — Encounter: Payer: Self-pay | Admitting: Family Medicine

## 2015-12-04 VITALS — BP 114/66 | HR 96 | Temp 97.9°F | Ht 61.0 in | Wt 145.0 lb

## 2015-12-04 DIAGNOSIS — M25472 Effusion, left ankle: Secondary | ICD-10-CM | POA: Diagnosis not present

## 2015-12-04 DIAGNOSIS — M25471 Effusion, right ankle: Secondary | ICD-10-CM

## 2015-12-04 NOTE — Progress Notes (Signed)
Subjective:    Patient ID: Janae Sauce Muzio, female    DOB: [birth year], 65 y.o.   MRN: [id number]  HPI  Ms. Guthmiller is a 65 year old female who presents today with report of bilateral edema of her feet for one week. Today she states that edema is not present. Associated symptoms during episode of swelling included discomfort with walking and improved with elevation.  Prior to this episode she reports being on her feet 13 hours a day during this time with cleaning and after helping her daughter move. She reported a trigger of "stubbing her toe" which she reports has resolved. Associated symptom of tingling was noted when edema was present however it is not present today. She denies chest pain, SOB, DOE, wheezing, orthopnea, palpitations, dizziness, or syncope. She reports eating out more often due to recent beach trip that included fried foods.   Review of Systems  Constitutional: Negative for fever and chills.  Respiratory: Negative for cough and shortness of breath.   Cardiovascular: Negative for chest pain, palpitations and leg swelling.  Gastrointestinal: Negative for nausea, vomiting, abdominal pain, diarrhea and constipation.  Genitourinary: Negative for dysuria, urgency, frequency and hematuria.  Skin: Negative for rash.  Neurological: Negative for dizziness, light-headedness and headaches.   Past Medical History  Diagnosis Date  . ALLERGIC RHINITIS   . [name]   . ANXIETY   . ASTHMA   . DEPRESSION   . HYPERLIPIDEMIA   . COMMON MIGRAINE   . [name]   . DIVERTICULOSIS, COLON   . Mild mitral regurgitation by prior echocardiogram   . GERD (gastroesophageal reflux disease) 09/26/2015     Social History   Social History  . Marital Status: Single    Spouse Name: N/A  . Number of Children: N/A  . Years of Education: N/A   Occupational History  . Not on file.   Social History Main Topics  . Smoking status: Former Games developer  . Smokeless tobacco:  Not on file  . Alcohol Use: No     Comment: occasional   . Drug Use: No  . Sexual Activity: Not on file   Other Topics Concern  . Not on file   Social History Narrative   Occasional  Caffeine drinker     Past Surgical History  Procedure Laterality Date  . Appendectomy    . Abdominal hysterectomy      Family History  Problem Relation Age of Onset  . Heart disease Father   . Heart disease Brother   . Colon cancer Neg Hx     Allergies  Allergen Reactions  . Nefazodone   . Trazodone And Nefazodone     Current Outpatient Prescriptions on File Prior to Visit  Medication Sig Dispense Refill  . amitriptyline (ELAVIL) 50 MG tablet TAKE 1 TABLET AT BEDTIME AS NEEDED FOR SLEEP. 90 tablet 0  . aspirin EC 81 MG tablet Take 1 tablet (81 mg total) by mouth daily. 90 tablet 11  . atorvastatin (LIPITOR) 10 MG tablet TAKE 1 TABLET ONCE DAILY. 90 tablet 0  . butalbital-acetaminophen-caffeine (FIORICET, ESGIC) 50-325-40 MG tablet TAKE 1 TABLET TWICE DAILY AS NEEDED FOR HEADACHE. 60 tablet 0  . Calcium Carbonate-Vit D-Min (CALCIUM 1200 PO) Take 1 capsule by mouth daily.    . Investigational vitamin D 600 UNITS capsule SWOG [id number] Take 1,200 Units by mouth daily. Take with food.    Marland Kitchen MINIVELLE 0.05 MG/24HR patch     . omeprazole (PRILOSEC) 20 MG capsule Take  1 capsule (20 mg total) by mouth daily. 90 capsule 3  . tolterodine (DETROL LA) 2 MG 24 hr capsule Take 1 capsule by mouth daily.      No current facility-administered medications on file prior to visit.    BP 114/66 mmHg  Pulse 96  Temp(Src) 97.9 F (36.6 C) (Oral)  Ht 5\' 1"  (1.549 m)  Wt 145 lb (65.772 kg)  BMI 27.41 kg/m2  SpO2 96%       Objective:   Physical Exam  Constitutional: She is oriented to person, place, and time. She appears well-developed and well-nourished.  Neck: Neck supple.  Cardiovascular: Normal rate, regular rhythm and normal heart sounds.  Exam reveals no gallop and no friction rub.   No murmur  heard. Pulmonary/Chest: Effort normal and breath sounds normal. She has no wheezes. She has no rales.  Abdominal: Soft. Bowel sounds are normal. There is no tenderness.  Musculoskeletal: She exhibits no edema.  Lymphadenopathy:    She has no cervical adenopathy.  Neurological: She is alert and oriented to person, place, and time.  Skin: Skin is warm and dry. No rash noted.  Psychiatric: She has a normal mood and affect. Her behavior is normal. Judgment and thought content normal.          Assessment & Plan:  1. Swelling of both ankles Advised patient to monitor salt intake and elevate feet above heart level at night if swelling of ankles occur. Also discussed that trauma to her foot that she mentioned when moving daughter may have contributed to edema that was present. Discussed with patient the importance of follow up if she notices that edema reoccurs or she has associated discomfort or redness with edema. Patient is also advised to monitor BP if edema occurs and document this information for PCP. Patient will follow up with PCP if symptoms reoccur. She also has lab work scheduled that her PCP has ordered. She will follow up with provider as recommended.  Roddie McJulia Aleah Ahlgrim, FNP-C

## 2015-12-04 NOTE — Patient Instructions (Addendum)
Please monitor salt intake and elevate feet at night above heart level if swelling is noted. If swelling reoccurs or is associated with discomfort or redness, please seek medical attention for further evaluation and treatment.

## 2016-01-05 ENCOUNTER — Ambulatory Visit (INDEPENDENT_AMBULATORY_CARE_PROVIDER_SITE_OTHER): Payer: BC Managed Care – PPO | Admitting: Family

## 2016-01-05 ENCOUNTER — Encounter: Payer: Self-pay | Admitting: Family

## 2016-01-05 DIAGNOSIS — R1030 Lower abdominal pain, unspecified: Secondary | ICD-10-CM

## 2016-01-05 MED ORDER — ATORVASTATIN CALCIUM 10 MG PO TABS: 10.0000 mg | ORAL_TABLET | Freq: Every day | ORAL | 0 refills | Status: DC

## 2016-01-05 MED ORDER — LINACLOTIDE 145 MCG PO CAPS: 145.0000 ug | ORAL_CAPSULE | Freq: Every day | ORAL | 0 refills | Status: DC

## 2016-01-05 MED ORDER — AMITRIPTYLINE HCL 50 MG PO TABS
50.0000 mg | ORAL_TABLET | Freq: Every evening | ORAL | 0 refills | Status: DC | PRN
Start: 2016-01-05 — End: 2016-03-29

## 2016-01-05 NOTE — Patient Instructions (Signed)
Thank you for choosing [facility].  Summary/Instructions:  Please try the Linzess - 1 tablet daily.  Increase fiber and water intake slowly.  Your prescription(s) have been submitted to your pharmacy or been printed and provided for you. Please take as directed and contact our office if you believe you are having problem(s) with the medication(s) or have any questions.   If your symptoms worsen or fail to improve, please contact our office for further instruction, or in case of emergency go directly to the emergency room at the closest medical facility.    Constipation, Adult Constipation is when a person has fewer than three bowel movements a week, has difficulty having a bowel movement, or has stools that are dry, hard, or larger than normal. As people grow older, constipation is more common. A low-fiber diet, not taking in enough fluids, and taking certain medicines may make constipation worse.  CAUSES   Certain medicines, such as antidepressants, pain medicine, iron supplements, antacids, and water pills.   Certain diseases, such as diabetes, irritable bowel syndrome (IBS), thyroid disease, or depression.   Not drinking enough water.   Not eating enough fiber-rich foods.   Stress or travel.   Lack of physical activity or exercise.   Ignoring the urge to have a bowel movement.   Using laxatives too much.  SIGNS AND SYMPTOMS   Having fewer than three bowel movements a week.   Straining to have a bowel movement.   Having stools that are hard, dry, or larger than normal.   Feeling full or bloated.   Pain in the lower abdomen.   Not feeling relief after having a bowel movement.  DIAGNOSIS  Your health care provider will take a medical history and perform a physical exam. Further testing may be done for severe constipation. Some tests may include:  A barium enema X-ray to examine your rectum, colon, and, sometimes, your small intestine.   A  sigmoidoscopy to examine your lower colon.   A colonoscopy to examine your entire colon. TREATMENT  Treatment will depend on the severity of your constipation and what is causing it. Some dietary treatments include drinking more fluids and eating more fiber-rich foods. Lifestyle treatments may include regular exercise. If these diet and lifestyle recommendations do not help, your health care provider may recommend taking over-the-counter laxative medicines to help you have bowel movements. Prescription medicines may be prescribed if over-the-counter medicines do not work.  HOME CARE INSTRUCTIONS   Eat foods that have a lot of fiber, such as fruits, vegetables, whole grains, and beans.  Limit foods high in fat and processed sugars, such as french fries, hamburgers, cookies, candies, and soda.   A fiber supplement may be added to your diet if you cannot get enough fiber from foods.   Drink enough fluids to keep your urine clear or pale yellow.   Exercise regularly or as directed by your health care provider.   Go to the restroom when you have the urge to go. Do not hold it.   Only take over-the-counter or prescription medicines as directed by your health care provider. Do not take other medicines for constipation without talking to your health care provider first.  SEEK IMMEDIATE MEDICAL CARE IF:   You have bright red blood in your stool.   Your constipation lasts for more than 4 days or gets worse.   You have abdominal or rectal pain.   You have thin, pencil-like stools.   You have unexplained weight loss.  MAKE SURE YOU:   Understand these instructions.  Will watch your condition.  Will get help right away if you are not doing well or get worse.   This information is not intended to replace advice given to you by your health care provider. Make sure you discuss any questions you have with your health care provider.   Document Released: 02/09/2004 Document Revised:  06/03/2014 Document Reviewed: 02/22/2013 Elsevier Interactive Patient Education [employer]2016 [employer]vier Inc.

## 2016-01-05 NOTE — Progress Notes (Signed)
Subjective:    Patient ID: Samantha SauceKaren D Eager, female    DOB: May 24, 1951, 65 y.o.   MRN: [id number]  Chief Complaint  Patient presents with  . Abdominal Pain    stomach has been hurting on and off for a week as well as having nausea, has had some nausea today    HPI:  [name] is a 65 y.o. female who  has a past medical history of ALLERGIC RHINITIS; [name]; ANXIETY; ASTHMA; COMMON MIGRAINE; DEPRESSION; DIVERTICULOSIS, COLON; GERD (gastroesophageal reflux disease) (09/26/2015); HYPERLIPIDEMIA; [name]; [name] and presents today for an acute office visit.   Associated symptom of pain located in her lower abdomen has been going on for about 1 week. Modifying factors include pepto bismol which helped with her symptoms, however the symptoms continue to return. Notes she had a low grade fever for 1 night at 99.4. She has had nausea without diarrhea, or vomiting. Describes having bowel movements 1-2 times per week. Pain is described as dull. Severity was enough to effect her sleep one evening. Course of the symptoms continuously worsen. Endorses some increased urinary frequency. No vaginal discharge or pain.   Allergies  Allergen Reactions  . Nefazodone   . Trazodone And Nefazodone      Current Outpatient Prescriptions on File Prior to Visit  Medication Sig Dispense Refill  . aspirin EC 81 MG tablet Take 1 tablet (81 mg total) by mouth daily. 90 tablet 11  . butalbital-acetaminophen-caffeine (FIORICET, ESGIC) 50-325-40 MG tablet TAKE 1 TABLET TWICE DAILY AS NEEDED FOR HEADACHE. 60 tablet 0  . Calcium Carbonate-Vit D-Min (CALCIUM 1200 PO) Take 1 capsule by mouth daily.    . Investigational vitamin D 600 UNITS capsule SWOG [id number] Take 1,200 Units by mouth daily. Take with food.    Marland Kitchen. MINIVELLE 0.05 MG/24HR patch     . omeprazole (PRILOSEC) 20 MG capsule Take 1 capsule (20 mg total) by mouth daily. 90 capsule 3  .  tolterodine (DETROL LA) 2 MG 24 hr capsule Take 1 capsule by mouth daily.      No current facility-administered medications on file prior to visit.      Past Surgical History:  Procedure Laterality Date  . ABDOMINAL HYSTERECTOMY    . APPENDECTOMY       Allergies  Allergen Reactions  . Nefazodone   . Trazodone And Nefazodone      Current Outpatient Prescriptions on File Prior to Visit  Medication Sig Dispense Refill  . aspirin EC 81 MG tablet Take 1 tablet (81 mg total) by mouth daily. 90 tablet 11  . butalbital-acetaminophen-caffeine (FIORICET, ESGIC) 50-325-40 MG tablet TAKE 1 TABLET TWICE DAILY AS NEEDED FOR HEADACHE. 60 tablet 0  . Calcium Carbonate-Vit D-Min (CALCIUM 1200 PO) Take 1 capsule by mouth daily.    . Investigational vitamin D 600 UNITS capsule SWOG [id number] Take 1,200 Units by mouth daily. Take with food.    Marland Kitchen. MINIVELLE 0.05 MG/24HR patch     . omeprazole (PRILOSEC) 20 MG capsule Take 1 capsule (20 mg total) by mouth daily. 90 capsule 3  . tolterodine (DETROL LA) 2 MG 24 hr capsule Take 1 capsule by mouth daily.      No current facility-administered medications on file prior to visit.     Past Medical History:  Diagnosis Date  . ALLERGIC RHINITIS   . [name]   . ANXIETY   . ASTHMA   . COMMON MIGRAINE   . DEPRESSION   .  DIVERTICULOSIS, COLON   . GERD (gastroesophageal reflux disease) 09/26/2015  . HYPERLIPIDEMIA   . [name]   . Mild mitral regurgitation by prior echocardiogram      Past Surgical History:  Procedure Laterality Date  . ABDOMINAL HYSTERECTOMY    . APPENDECTOMY      Review of Systems  Constitutional: Negative for chills and fever.  Gastrointestinal: Positive for abdominal pain. Negative for blood in stool, constipation, diarrhea and rectal pain.  Genitourinary: Positive for frequency. Negative for dysuria, flank pain, pelvic pain and urgency.      Objective:    BP (!) 126/92 (BP Location: Left  Arm, Patient Position: Sitting, Cuff Size: Normal)   Pulse 89   Temp 97.9 F (36.6 C) (Oral)   Resp 16   Ht  (1.549 m)   Wt 144 lb 1.9 oz (65.4 kg)   SpO2 97%   BMI 27.23 kg/m  Nursing note and vital signs reviewed.  Physical Exam  Constitutional: She is oriented to person, place, and time. She appears well-developed and well-nourished. No distress.  Cardiovascular: Normal rate, regular rhythm, normal heart sounds and intact distal pulses.   Pulmonary/Chest: Effort normal and breath sounds normal.  Abdominal: Normal appearance and bowel sounds are normal. There is no hepatosplenomegaly. There is tenderness in the right lower quadrant, suprapubic area and left lower quadrant. There is no rigidity, no rebound, no guarding, no CVA tenderness, no tenderness at McBurney's point and negative Murphy's sign.  Neurological: She is alert and oriented to person, place, and time.  Skin: Skin is warm and dry.  Psychiatric: She has a normal mood and affect. Her behavior is normal. Judgment and thought content normal.       Assessment & Plan:   Problem List Items Addressed This Visit      Other   Lower abdominal pain    Symptoms and exam are consistent with constipation as she has 1-2 bowel movements per week. Abdominal exam is otherwise benign. Recommend bowel regimen increasing fiber and water intake. Will trial Linzess. Follow up if symptoms worsen or do not improve.       Relevant Medications   linaclotide (LINZESS) 145 MCG CAPS capsule    Other Visit Diagnoses   None.      I have changed Ms. Atkison's atorvastatin and amitriptyline. I am also having her start on linaclotide. Additionally, I am having her maintain her Investigational vitamin D, Calcium Carbonate-Vit D-Min (CALCIUM 1200 PO), MINIVELLE, aspirin EC, tolterodine, butalbital-acetaminophen-caffeine, and omeprazole.   Meds ordered this encounter  Medications  . atorvastatin (LIPITOR) 10 MG tablet    Sig: Take 1 tablet  (10 mg total) by mouth daily.    Dispense:  90 tablet    Refill:  0  . amitriptyline (ELAVIL) 50 MG tablet    Sig: Take 1 tablet (50 mg total) by mouth at bedtime as needed. for sleep    Dispense:  90 tablet    Refill:  0  . linaclotide (LINZESS) 145 MCG CAPS capsule    Sig: Take 1 capsule (145 mcg total) by mouth daily before breakfast.    Dispense:  4 capsule    Refill:  0    Order Specific Question:   Supervising Provider    Answer:   Hillard Danker A [[id number]     Follow-up: No Follow-up on file.  Jeanine Luz, FNP

## 2016-01-05 NOTE — Assessment & Plan Note (Signed)
Symptoms and exam are consistent with constipation as she has 1-2 bowel movements per week. Abdominal exam is otherwise benign. Recommend bowel regimen increasing fiber and water intake. Will trial Linzess. Follow up if symptoms worsen or do not improve.

## 2016-02-23 ENCOUNTER — Encounter: Payer: Self-pay | Admitting: Nurse Practitioner

## 2016-02-23 ENCOUNTER — Ambulatory Visit (INDEPENDENT_AMBULATORY_CARE_PROVIDER_SITE_OTHER): Payer: BC Managed Care – PPO | Admitting: Nurse Practitioner

## 2016-02-23 ENCOUNTER — Ambulatory Visit (INDEPENDENT_AMBULATORY_CARE_PROVIDER_SITE_OTHER)
Admission: RE | Admit: 2016-02-23 | Discharge: 2016-02-23 | Disposition: A | Discharge status: Home / Self Care | Payer: BC Managed Care – PPO | Source: Ambulatory Visit | Attending: Nurse Practitioner | Admitting: Nurse Practitioner

## 2016-02-23 VITALS — BP 122/82 | HR 87 | Temp 98.7°F | Wt 143.0 lb

## 2016-02-23 DIAGNOSIS — S8011XA Contusion of right lower leg, initial encounter: Secondary | ICD-10-CM

## 2016-02-23 DIAGNOSIS — S8991XA Unspecified injury of right lower leg, initial encounter: Secondary | ICD-10-CM

## 2016-02-23 NOTE — Progress Notes (Signed)
Subjective:  [name], female    DOB: 09/07/1950  Age: 65 y.o. MRN: [id number]  CC: Leg Pain (Pt stated injured Rt back of the leg/lateral side of the foot is painful/bruise for about 1 week)  Leg Pain   The incident occurred 5 to 7 days ago. Incident location: at [employer] (box of diaper fell on right leg) The injury mechanism was a direct blow. The pain is present in the right leg. The quality of the pain is described as aching. The pain is mild. The pain has been intermittent since onset. Pertinent negatives include no inability to bear weight, loss of motion, loss of sensation, muscle weakness, numbness or tingling. She reports no foreign bodies present. The symptoms are aggravated by palpation. She has tried nothing for the symptoms.    Outpatient Medications Prior to Visit  Medication Sig Dispense Refill  . amitriptyline (ELAVIL) 50 MG tablet Take 1 tablet (50 mg total) by mouth at bedtime as needed. for sleep 90 tablet 0  . aspirin EC 81 MG tablet Take 1 tablet (81 mg total) by mouth daily. 90 tablet 11  . atorvastatin (LIPITOR) 10 MG tablet Take 1 tablet (10 mg total) by mouth daily. 90 tablet 0  . butalbital-acetaminophen-caffeine (FIORICET, ESGIC) 50-325-40 MG tablet TAKE 1 TABLET TWICE DAILY AS NEEDED FOR HEADACHE. 60 tablet 0  . Calcium Carbonate-Vit D-Min (CALCIUM 1200 PO) Take 1 capsule by mouth daily.    . Investigational vitamin D 600 UNITS capsule SWOG X3244 Take 1,200 Units by mouth daily. Take with food.    . linaclotide (LINZESS) 145 MCG CAPS capsule Take 1 capsule (145 mcg total) by mouth daily before breakfast. 4 capsule 0  . MINIVELLE 0.05 MG/24HR patch     . omeprazole (PRILOSEC) 20 MG capsule Take 1 capsule (20 mg total) by mouth daily. 90 capsule 3  . tolterodine (DETROL LA) 2 MG 24 hr capsule Take 1 capsule by mouth daily.      No facility-administered medications prior to visit.     ROS See HPI  Objective:  BP 122/82 (BP Location: Left Arm,  Patient Position: Sitting, Cuff Size: Normal)   Pulse 87   Temp 98.7 F (37.1 C)   Wt 143 lb (64.9 kg)   SpO2 99%   BMI 27.02 kg/m   BP Readings from Last 3 Encounters:  02/23/16 122/82  01/05/16 (!) 126/92  12/04/15 114/66    Wt Readings from Last 3 Encounters:  02/23/16 143 lb (64.9 kg)  01/05/16 144 lb 1.9 oz (65.4 kg)  12/04/15 145 lb (65.8 kg)    Physical Exam  Constitutional: She is oriented to person, place, and time.  Cardiovascular: Normal rate.   Pulmonary/Chest: Effort normal.  Musculoskeletal:       Right lower leg: She exhibits no edema, no deformity and no laceration.       Legs: Neurological: She is alert and oriented to person, place, and time.  Normal gait. Normal and equal pos.tibialis and dorsal pedis pulses. No LE edema. Normal ankle and knee ROM.  Skin: Skin is warm and dry.    Lab Results  Component Value Date   WBC 7.8 09/26/2015   HGB 13.9 09/26/2015   HCT 40.6 09/26/2015   PLT 367.0 09/26/2015   GLUCOSE 122 (H) 09/26/2015   CHOL 189 09/26/2015   TRIG 357.0 (H) 09/26/2015   HDL 62.30 09/26/2015   LDLDIRECT 101.0 09/26/2015   LDLCALC 68 07/21/2012   ALT 22 09/26/2015  AST 18 09/26/2015   NA 142 09/26/2015   K 4.2 09/26/2015   CL 107 09/26/2015   CREATININE 0.90 09/26/2015   BUN 8 09/26/2015   CO2 27 09/26/2015   TSH 3.12 09/26/2015   INR 1.0 05/10/2008   HGBA1C 6.0 09/26/2015    Dg Tibia/fibula Right  Result Date: 02/23/2016 CLINICAL DATA:  Right lower leg injury 1 week ago. EXAM: RIGHT TIBIA AND FIBULA - 2 VIEW COMPARISON:  None. FINDINGS: There is no evidence of fracture or other focal bone lesions. Soft tissues are unremarkable. IMPRESSION: Normal right tibia and fibula. Electronically Signed   By: Lupita RaiderJames  Green Jr, M.D.   On: 02/23/2016 17:06    Assessment & Plan:   [name] was seen today for leg pain.  Diagnoses and all orders for this visit:  Right leg injury, initial encounter -     Cancel: XR Tibia/Fibula Right;  Future -     DG Tibia/Fibula Right; Future  Traumatic hematoma of right lower leg, initial encounter -     Cancel: XR Tibia/Fibula Right; Future -     DG Tibia/Fibula Right; Future   I am having [name] maintain her Investigational vitamin D, Calcium Carbonate-Vit D-Min (CALCIUM 1200 PO), MINIVELLE, aspirin EC, tolterodine, butalbital-acetaminophen-caffeine, omeprazole, atorvastatin, amitriptyline, linaclotide, and FABIOR.  Meds ordered this encounter  Medications  . FABIOR 0.1 % FOAM    Sig: apply TO FACE EVERY DAY AT BEDTIME over moisturizer    Refill:  5    Follow-up: No Follow-up on file.  Alysia Pennaharlotte Alyssa Mancera, NP

## 2016-02-23 NOTE — Progress Notes (Signed)
Pre visit review using our clinic review tool, if applicable. No additional management support is needed unless otherwise documented below in the visit note. 

## 2016-02-23 NOTE — Progress Notes (Signed)
Normal results, see office note

## 2016-02-23 NOTE — Patient Instructions (Signed)
Will call with x-ray results.  Return to office if develops any signs of cellulitis or DVT.  Cellulitis Cellulitis is an infection of the skin and the tissue beneath it. The infected area is usually red and tender. Cellulitis occurs most often in the arms and lower legs.  CAUSES  Cellulitis is caused by bacteria that enter the skin through cracks or cuts in the skin. The most common types of bacteria that cause cellulitis are staphylococci and streptococci. SIGNS AND SYMPTOMS   Redness and warmth.  Swelling.  Tenderness or pain.  Fever. DIAGNOSIS  Your health care provider can usually determine what is wrong based on a physical exam. Blood tests may also be done. TREATMENT  Treatment usually involves taking an antibiotic medicine. HOME CARE INSTRUCTIONS   Take your antibiotic medicine as directed by your health care provider. Finish the antibiotic even if you start to feel better.  Keep the infected arm or leg elevated to reduce swelling.  Apply a warm cloth to the affected area up to 4 times per day to relieve pain.  Take medicines only as directed by your health care provider.  Keep all follow-up visits as directed by your health care provider. SEEK MEDICAL CARE IF:   You notice red streaks coming from the infected area.  Your red area gets larger or turns dark in color.  Your bone or joint underneath the infected area becomes painful after the skin has healed.  Your infection returns in the same area or another area.  You notice a swollen bump in the infected area.  You develop new symptoms.  You have a fever. SEEK IMMEDIATE MEDICAL CARE IF:   You feel very sleepy.  You develop vomiting or diarrhea.  You have a general ill feeling (malaise) with muscle aches and pains.   This information is not intended to replace advice given to you by your health care provider. Make sure you discuss any questions you have with your health care provider.   Document  Released: 02/20/2005 Document Revised: 02/01/2015 Document Reviewed: 07/29/2011 Elsevier Interactive Patient Education 2016 [employer]  Deep Vein Thrombosis A deep vein thrombosis (DVT) is a blood clot (thrombus) that usually occurs in a deep, larger vein of the lower leg or the pelvis, or in an upper extremity such as the arm. These are dangerous and can lead to serious and even life-threatening complications if the clot travels to the lungs. A DVT can damage the valves in your leg veins so that instead of flowing upward, the blood pools in the lower leg. This is called post-thrombotic syndrome, and it can result in pain, swelling, discoloration, and sores on the leg. CAUSES A DVT is caused by the formation of a blood clot in your leg, pelvis, or arm. Usually, several things contribute to the formation of blood clots. A clot may develop when:  Your blood flow slows down.  Your vein becomes damaged in some way.  You have a condition that makes your blood clot more easily. RISK FACTORS A DVT is more likely to develop in:  People who are older, especially over 64 years of age.  People who are overweight (obese).  People who sit or lie still for a long time, such as during long-distance travel (over 4 hours), bed rest, hospitalization, or during recovery from certain medical conditions like a stroke.  People who do not engage in much physical activity (sedentary lifestyle).  People who have chronic breathing disorders.  People who have a  personal or family history of blood clots or blood clotting disease.  People who have peripheral vascular disease (PVD), diabetes, or some types of cancer.  People who have heart disease, especially if the person had a recent heart attack or has congestive heart failure.  People who have neurological diseases that affect the legs (leg paresis).  People who have had a traumatic injury, such as breaking a hip or leg.  People who have recently had  major or lengthy surgery, especially on the hip, knee, or abdomen.  People who have had a central line placed inside a large vein.  People who take medicines that contain the hormone estrogen. These include birth control pills and hormone replacement therapy.  Pregnancy or during childbirth or the postpartum period.  Long plane flights (over 8 hours). SIGNS AND SYMPTOMS Symptoms of a DVT can include:   Swelling of your leg or arm, especially if one side is much worse.  Warmth and redness of your leg or arm, especially if one side is much worse.  Pain in your arm or leg. If the clot is in your leg, symptoms may be more noticeable or worse when you stand or walk.  A feeling of pins and needles, if the clot is in the arm. The symptoms of a DVT that has traveled to the lungs (pulmonary embolism, PE) usually start suddenly and include:  Shortness of breath while active or at rest.  Coughing or coughing up blood or blood-tinged mucus.  Chest pain that is often worse with deep breaths.  Rapid or irregular heartbeat.  Feeling light-headed or dizzy.  Fainting.  Feeling anxious.  Sweating. There may also be pain and swelling in a leg if that is where the blood clot started. These symptoms may represent a serious problem that is an emergency. Do not wait to see if the symptoms will go away. Get medical help right away. Call your local emergency services (911 in the [place] Do not drive yourself to the hospital. DIAGNOSIS Your health care provider will take a medical history and perform a physical exam. You may also have other tests, including:  Blood tests to assess the clotting properties of your blood.  Imaging tests, such as CT, ultrasound, MRI, X-ray, and other tests to see if you have clots anywhere in your body. TREATMENT After a DVT is identified, it can be treated. The type of treatment that you receive depends on many factors, such as the cause of your DVT, your risk for  bleeding or developing more clots, and other medical conditions that you have. Sometimes, a combination of treatments is necessary. Treatment options may be combined and include:  Monitoring the blood clot with ultrasound.  Taking medicines by mouth, such as newer blood thinners (anticoagulants), thrombolytics, or warfarin.  Taking anticoagulant medicine by injection or through an IV tube.  Wearing compression stockings or using different types ofdevices.  Surgery (rare) to remove the blood clot or to place a filter in your abdomen to stop the blood clot from traveling to your lungs. Treatments for a DVT are often divided into immediate treatment and long-term treatment (up to 3 months after DVT). You can work with your health care provider to choose the treatment program that is best for you. HOME CARE INSTRUCTIONS If you are taking a newer oral anticoagulant:  Take the medicine every single day at the same time each day.  Understand what foods and drugs interact with this medicine.  Understand that there are no  regular blood tests required when using this medicine.  Understand the side effects of this medicine, including excessive bruising or bleeding. Ask your health care provider or pharmacist about other possible side effects. If you are taking warfarin:  Understand how to take warfarin and know which foods can affect how warfarin works in [employer] account [employer].  Understand that it is dangerous to take too much or too little warfarin. Too much warfarin increases the risk of bleeding. Too little warfarin continues to allow the risk for blood clots.  Follow your PT and INR blood testing schedule. The PT and INR results allow your health care provider to adjust your dose of warfarin. It is very important that you have your PT and INR tested as often as told by your health care provider.  Avoid major changes in your diet, or tell your health care provider before you change your diet. Arrange a  visit with a registered dietitian to answer your questions. Many foods, especially foods that are high in vitamin K, can interfere with warfarin and affect the PT and INR results. Eat a consistent amount of foods that are high in vitamin K, such as:  Spinach, kale, broccoli, cabbage, collard greens, turnip greens, Brussels sprouts, peas, cauliflower, seaweed, and parsley.  Beef liver and pork liver.  Green tea.  Soybean oil.  Tell your health care provider about any and all medicines, vitamins, and supplements that you take, including aspirin and other over-the-counter anti-inflammatory medicines. Be especially cautious with aspirin and anti-inflammatory medicines. Do not take those before you ask your health care provider if it is safe to do so. This is important because many medicines can interfere with warfarin and affect the PT and INR results.  Do not start or stop taking any over-the-counter or prescription medicine unless your health care provider or pharmacist tells you to do so. If you take warfarin, you will also need to do these things:  Hold pressure over cuts for longer than usual.  Tell your dentist and other health care providers that you are taking warfarin before you have any procedures in which bleeding may occur.  Avoid alcohol or drink very small amounts. Tell your health care provider if you change your alcohol intake.  Do not use tobacco products, including cigarettes, chewing tobacco, and e-cigarettes. If you need help quitting, ask your health care provider.  Avoid contact sports. General Instructions  Take over-the-counter and prescription medicines only as told by your health care provider. Anticoagulant medicines can have side effects, including easy bruising and difficulty stopping bleeding. If you are prescribed an anticoagulant, you will also need to do these things:  Hold pressure over cuts for longer than usual.  Tell your dentist and other health care  providers that you are taking anticoagulants before you have any procedures in which bleeding may occur.  Avoid contact sports.  Wear a medical alert bracelet or carry a medical alert card that says you have had a PE.  Ask your health care provider how soon you can go back to your normal activities. Stay active to prevent new blood clots from forming.  Make sure to exercise while traveling or when you have been sitting or standing for a long period of time. It is very important to exercise. Exercise your legs by walking or by tightening and relaxing your leg muscles often. Take frequent walks.  Wear compression stockings as told by your health care provider to help prevent more blood clots from forming.  Do not  use tobacco products, including cigarettes, chewing tobacco, and e-cigarettes. If you need help quitting, ask your health care provider.  Keep all follow-up appointments with your health care provider. This is important. PREVENTION Take these actions to decrease your risk of developing another DVT:  Exercise regularly. For at least 30 minutes every day, engage in:  Activity that involves moving your arms and legs.  Activity that encourages good blood flow through your body by increasing your heart rate.  Exercise your arms and legs every hour during long-distance travel (over 4 hours). Drink plenty of water and avoid drinking alcohol while traveling.  Avoid sitting or lying in bed for long periods of time without moving your legs.  Maintain a weight that is appropriate for your height. Ask your health care provider what weight is healthy for you.  If you are a woman who is over 57 years of age, avoid unnecessary use of medicines that contain estrogen. These include birth control pills.  Do not smoke, especially if you take estrogen medicines. If you need help quitting, ask your health care provider. If you are hospitalized, prevention measures may include:  Early walking  after surgery, as soon as your health care provider says that it is safe.  Receiving anticoagulants to prevent blood clots.If you cannot take anticoagulants, other options may be available, such as wearing compression stockings or using different types of devices. SEEK IMMEDIATE MEDICAL CARE IF:  You have new or increased pain, swelling, or redness in an arm or leg.  You have numbness or tingling in an arm or leg.  You have shortness of breath while active or at rest.  You have chest pain.  You have a rapid or irregular heartbeat.  You feel light-headed or dizzy.  You cough up blood.  You notice blood in your vomit, bowel movement, or urine. These symptoms may represent a serious problem that is an emergency. Do not wait to see if the symptoms will go away. Get medical help right away. Call your local emergency services (911 in the [place] Do not drive yourself to the hospital.   This information is not intended to replace advice given to you by your health care provider. Make sure you discuss any questions you have with your health care provider.   Document Released: 05/13/2005 Document Revised: 02/01/2015 Document Reviewed: 09/07/2014 Elsevier Interactive Patient Education [employer]

## 2016-03-29 ENCOUNTER — Other Ambulatory Visit: Payer: Self-pay | Admitting: Internal Medicine

## 2016-04-06 ENCOUNTER — Other Ambulatory Visit: Payer: Self-pay | Admitting: Internal Medicine

## 2016-04-08 ENCOUNTER — Other Ambulatory Visit: Payer: Self-pay | Admitting: Family

## 2016-04-17 ENCOUNTER — Other Ambulatory Visit: Payer: Self-pay | Admitting: Family

## 2016-04-22 ENCOUNTER — Other Ambulatory Visit: Payer: Self-pay | Admitting: Gynecology

## 2016-04-22 DIAGNOSIS — R928 Other abnormal and inconclusive findings on diagnostic imaging of breast: Secondary | ICD-10-CM

## 2016-05-15 ENCOUNTER — Ambulatory Visit
Admission: RE | Admit: 2016-05-15 | Discharge: 2016-05-15 | Disposition: A | Discharge status: Home / Self Care | Payer: BC Managed Care – PPO | Source: Ambulatory Visit | Attending: Gynecology | Admitting: Gynecology

## 2016-05-15 ENCOUNTER — Other Ambulatory Visit: Payer: BC Managed Care – PPO

## 2016-05-15 DIAGNOSIS — R928 Other abnormal and inconclusive findings on diagnostic imaging of breast: Secondary | ICD-10-CM

## 2016-07-10 ENCOUNTER — Telehealth: Payer: Self-pay | Admitting: Emergency Medicine

## 2016-07-10 NOTE — Telephone Encounter (Signed)
Pt states she fell and hurt her knee. It is bruised and wants to know if you can give her a call back about it. She is aware you are out of the office. Please advise thanks.

## 2016-07-11 NOTE — Telephone Encounter (Signed)
Called patient she advised me that she spoke with her orthopedic surgeon and she is fine

## 2016-07-23 ENCOUNTER — Ambulatory Visit (INDEPENDENT_AMBULATORY_CARE_PROVIDER_SITE_OTHER): Payer: BC Managed Care – PPO | Admitting: Nurse Practitioner

## 2016-07-23 ENCOUNTER — Ambulatory Visit (INDEPENDENT_AMBULATORY_CARE_PROVIDER_SITE_OTHER)
Admission: RE | Admit: 2016-07-23 | Discharge: 2016-07-23 | Disposition: A | Discharge status: Home / Self Care | Payer: BC Managed Care – PPO | Source: Ambulatory Visit | Attending: Nurse Practitioner | Admitting: Nurse Practitioner

## 2016-07-23 ENCOUNTER — Encounter: Payer: Self-pay | Admitting: Nurse Practitioner

## 2016-07-23 ENCOUNTER — Other Ambulatory Visit (INDEPENDENT_AMBULATORY_CARE_PROVIDER_SITE_OTHER): Payer: BC Managed Care – PPO

## 2016-07-23 VITALS — BP 126/82 | HR 75 | Temp 98.3°F | Ht 61.0 in | Wt 147.0 lb

## 2016-07-23 DIAGNOSIS — S8991XA Unspecified injury of right lower leg, initial encounter: Secondary | ICD-10-CM

## 2016-07-23 DIAGNOSIS — W19XXXA Unspecified fall, initial encounter: Secondary | ICD-10-CM

## 2016-07-23 DIAGNOSIS — R5383 Other fatigue: Secondary | ICD-10-CM

## 2016-07-23 DIAGNOSIS — M25461 Effusion, right knee: Secondary | ICD-10-CM

## 2016-07-23 DIAGNOSIS — M25561 Pain in right knee: Secondary | ICD-10-CM

## 2016-07-23 LAB — IBC PANEL
IRON: 57 ug/dL (ref 42–145)
Saturation Ratios: 14.2 % — ABNORMAL LOW (ref 20.0–50.0)
Transferrin: 287 mg/dL (ref 212.0–360.0)

## 2016-07-23 NOTE — Progress Notes (Signed)
Subjective:  [name], female    DOB: [birth year]  Age: 66 y.o. MRN: [id number]  CC: Knee Pain (knee pain,fall 1 wk ago/ tired easy/ med consult?)   Knee Pain   The incident occurred 5 to 7 days ago. The incident occurred at home. The injury mechanism was a direct blow. The pain is present in the right knee. The quality of the pain is described as aching. The pain is moderate. The pain has been constant since onset. Pertinent negatives include no inability to bear weight, loss of motion, loss of sensation, muscle weakness, numbness or tingling. The symptoms are aggravated by movement and palpation. She has tried rest and elevation for the symptoms. The treatment provided mild relief.   Fatigue: complains of chronic intermittent fatigue. Worse in last 2weeks. Will like iron level checked. Denies any GI/GU bleed. No palpitation, no chest pain, no SOB.  Outpatient Medications Prior to Visit  Medication Sig Dispense Refill  . amitriptyline (ELAVIL) 50 MG tablet TAKE 1 TABLET AT BEDTIME AS NEEDED FOR SLEEP. 90 tablet 0  . aspirin EC 81 MG tablet Take 1 tablet (81 mg total) by mouth daily. 90 tablet 11  . atorvastatin (LIPITOR) 10 MG tablet TAKE 1 TABLET ONCE DAILY. 90 tablet 0  . butalbital-acetaminophen-caffeine (FIORICET, ESGIC) 50-325-40 MG tablet TAKE 1 TABLET TWICE DAILY AS NEEDED FOR HEADACHE. 60 tablet 0  . Calcium Carbonate-Vit D-Min (CALCIUM 1200 PO) Take 1 capsule by mouth daily.    Marland Kitchen FABIOR 0.1 % FOAM apply TO FACE EVERY DAY AT BEDTIME over moisturizer  5  . Investigational vitamin D 600 UNITS capsule SWOG S0812 Take 1,200 Units by mouth daily. Take with food.    . linaclotide (LINZESS) 145 MCG CAPS capsule Take 1 capsule (145 mcg total) by mouth daily before breakfast. 4 capsule 0  . MINIVELLE 0.05 MG/24HR patch     . omeprazole (PRILOSEC) 20 MG capsule Take 1 capsule (20 mg total) by mouth daily. 90 capsule 3  . tolterodine (DETROL LA) 2 MG 24 hr capsule Take 1  capsule by mouth daily.      No facility-administered medications prior to visit.     ROS See HPI  Objective:  BP 126/82   Pulse 75   Temp 98.3 F (36.8 C)   Ht 5\' 1"  (1.549 m)   Wt 147 lb (66.7 kg)   SpO2 97%   BMI 27.78 kg/m   BP Readings from Last 3 Encounters:  07/23/16 126/82  02/23/16 122/82  01/05/16 (!) 126/92    Wt Readings from Last 3 Encounters:  07/23/16 147 lb (66.7 kg)  02/23/16 143 lb (64.9 kg)  01/05/16 144 lb 1.9 oz (65.4 kg)    Physical Exam  Constitutional: She is oriented to person, place, and time. No distress.  Cardiovascular: Normal rate.   Pulmonary/Chest: Effort normal.  Musculoskeletal: She exhibits edema and tenderness.       Right knee: She exhibits swelling, effusion, ecchymosis and bony tenderness. She exhibits normal range of motion, no deformity, no laceration, no erythema and normal alignment. Tenderness found. Patellar tendon tenderness noted. No medial joint line and no lateral joint line tenderness noted.       Legs: Positive apprehension test on right knee.  Neurological: She is alert and oriented to person, place, and time.  Skin: Skin is warm and dry.  Vitals reviewed.   Lab Results  Component Value Date   WBC 7.8 09/26/2015   HGB 13.9 09/26/2015  HCT 40.6 09/26/2015   PLT 367.0 09/26/2015   GLUCOSE 122 (H) 09/26/2015   CHOL 189 09/26/2015   TRIG 357.0 (H) 09/26/2015   HDL 62.30 09/26/2015   LDLDIRECT 101.0 09/26/2015   LDLCALC 68 07/21/2012   ALT 22 09/26/2015   AST 18 09/26/2015   NA 142 09/26/2015   K 4.2 09/26/2015   CL 107 09/26/2015   CREATININE 0.90 09/26/2015   BUN 8 09/26/2015   CO2 27 09/26/2015   TSH 3.12 09/26/2015   INR 1.0 05/10/2008   HGBA1C 6.0 09/26/2015    US Breast Ltd Uni Right Inc Axilla  Result Date: 05/15/2016 CLINICAL DATA:  Superior right asymmetry seen on most recent screening mammography. EXAM: 2D DIGITAL DIAGNOSTIC RIGHT MAMMOGRAM WITH CAD AND ADJUNCT TOMO ULTRASOUND RIGHT  BREAST COMPARISON:  Previous exam(s). ACR Breast Density Category b: There are scattered areas of fibroglandular density. FINDINGS: Additional mammographic views of the right breast demonstrate no suspicious masses, areas of architectural distortion or microcalcifications. The previously seen asymmetry effaces to glandular tissue. Mammographic images were processed with CAD. On physical exam, no suspicious masses are seen. Targeted ultrasound is performed, showing no suspicious masses or shadowing lesions. IMPRESSION: No mammographic or sonographic evidence of malignancy in the right breast. RECOMMENDATION: Screening mammogram in one year.(Code:SM-B-01Y) I have discussed the findings and recommendations with the patient. Results were also provided in writing at the conclusion of the visit. If applicable, a reminder letter will be sent to the patient regarding the next appointment. BI-RADS CATEGORY  1: Negative. Electronically Signed   By: Ted Mcalpine M.D.   On: 05/15/2016 15:27   Mm Diag Breast Tomo Uni Right  Result Date: 05/15/2016 CLINICAL DATA:  Superior right asymmetry seen on most recent screening mammography. EXAM: 2D DIGITAL DIAGNOSTIC RIGHT MAMMOGRAM WITH CAD AND ADJUNCT TOMO ULTRASOUND RIGHT BREAST COMPARISON:  Previous exam(s). ACR Breast Density Category b: There are scattered areas of fibroglandular density. FINDINGS: Additional mammographic views of the right breast demonstrate no suspicious masses, areas of architectural distortion or microcalcifications. The previously seen asymmetry effaces to glandular tissue. Mammographic images were processed with CAD. On physical exam, no suspicious masses are seen. Targeted ultrasound is performed, showing no suspicious masses or shadowing lesions. IMPRESSION: No mammographic or sonographic evidence of malignancy in the right breast. RECOMMENDATION: Screening mammogram in one year.(Code:SM-B-01Y) I have discussed the findings and recommendations  with the patient. Results were also provided in writing at the conclusion of the visit. If applicable, a reminder letter will be sent to the patient regarding the next appointment. BI-RADS CATEGORY  1: Negative. Electronically Signed   By: Ted Mcalpine M.D.   On: 05/15/2016 15:27    Assessment & Plan:   Karren was seen today for knee pain.  Diagnoses and all orders for this visit:  Injury of right knee, initial encounter -     DG Knee Complete 4 Views Right; Future -     MR Knee Right Wo Contrast; Future  Fall, initial encounter -     DG Knee Complete 4 Views Right; Future -     MR Knee Right Wo Contrast; Future  Fatigue, unspecified type -     Ferritin; Future -     IBC panel; Future  Pain and swelling of knee, right -     MR Knee Right Wo Contrast; Future   I am having Ms. Zehner maintain her Investigational vitamin D, Calcium Carbonate-Vit D-Min (CALCIUM 1200 PO), MINIVELLE, aspirin EC, tolterodine, butalbital-acetaminophen-caffeine, omeprazole, linaclotide, FABIOR,  amitriptyline, and atorvastatin.  No orders of the defined types were placed in this encounter.   Follow-up: Return if symptoms worsen or fail to improve.  Alysia Pennaharlotte Zaccary Creech, NP

## 2016-07-23 NOTE — Progress Notes (Signed)
Pre visit review using our clinic review tool, if applicable. No additional management support is needed unless otherwise documented below in the visit note. 

## 2016-07-23 NOTE — Patient Instructions (Addendum)
Normal knee x-ray. MRI ordered to evaluate ligament and meniscus.

## 2016-07-24 ENCOUNTER — Telehealth: Payer: Self-pay | Admitting: Nurse Practitioner

## 2016-07-24 ENCOUNTER — Other Ambulatory Visit: Payer: Self-pay | Admitting: Family

## 2016-07-24 ENCOUNTER — Other Ambulatory Visit: Payer: Self-pay | Admitting: Internal Medicine

## 2016-07-24 LAB — FERRITIN: Ferritin: 28 ng/mL (ref 10.0–291.0)

## 2016-07-24 NOTE — Telephone Encounter (Signed)
Patient said she would need us to set up that MRI for her knee. Could you please put a referral in for that. Thank you.

## 2016-07-24 NOTE — Telephone Encounter (Signed)
Already done. Please also inform her that iron level is mildly decrease as previous reading 7880yrs ago. Encourage iron rich foods and f/up with her pcp to eval for possible GI bleed.

## 2016-07-25 ENCOUNTER — Telehealth: Payer: Self-pay | Admitting: *Deleted

## 2016-07-25 NOTE — Telephone Encounter (Signed)
Ok

## 2016-07-25 NOTE — Telephone Encounter (Signed)
Patient informed. And will make an appointment later to follow up with pcp

## 2016-07-25 NOTE — Telephone Encounter (Signed)
Left msg on triage stating saw NP couple days ago, and wanting to let her know she does not want to have the MRI. Will be seeing her Orthopedic MD.../lmb

## 2016-08-09 ENCOUNTER — Ambulatory Visit (INDEPENDENT_AMBULATORY_CARE_PROVIDER_SITE_OTHER): Payer: BC Managed Care – PPO | Admitting: Internal Medicine

## 2016-08-09 ENCOUNTER — Encounter: Payer: Self-pay | Admitting: Internal Medicine

## 2016-08-09 VITALS — BP 132/78 | HR 101 | Temp 99.8°F | Ht 61.0 in | Wt 146.0 lb

## 2016-08-09 DIAGNOSIS — R059 Cough, unspecified: Secondary | ICD-10-CM

## 2016-08-09 DIAGNOSIS — R05 Cough: Secondary | ICD-10-CM

## 2016-08-09 DIAGNOSIS — I10 Essential (primary) hypertension: Secondary | ICD-10-CM

## 2016-08-09 DIAGNOSIS — R062 Wheezing: Secondary | ICD-10-CM | POA: Diagnosis not present

## 2016-08-09 DIAGNOSIS — Z0001 Encounter for general adult medical examination with abnormal findings: Secondary | ICD-10-CM

## 2016-08-09 DIAGNOSIS — E785 Hyperlipidemia, unspecified: Secondary | ICD-10-CM | POA: Diagnosis not present

## 2016-08-09 DIAGNOSIS — R739 Hyperglycemia, unspecified: Secondary | ICD-10-CM

## 2016-08-09 MED ORDER — AMITRIPTYLINE HCL 50 MG PO TABS: 50.0000 mg | ORAL_TABLET | Freq: Every evening | ORAL | 1 refills | Status: DC | PRN

## 2016-08-09 MED ORDER — AZITHROMYCIN 250 MG PO TABS: ORAL_TABLET | ORAL | 1 refills | Status: DC

## 2016-08-09 MED ORDER — BUTALBITAL-APAP-CAFFEINE 50-325-40 MG PO TABS: ORAL_TABLET | ORAL | 0 refills | Status: DC

## 2016-08-09 MED ORDER — METHYLPREDNISOLONE ACETATE 80 MG/ML IJ SUSP
80.0000 mg | Freq: Once | INTRAMUSCULAR | Status: AC
  Administered 2016-08-09: 80 mg via INTRAMUSCULAR

## 2016-08-09 MED ORDER — TOLTERODINE TARTRATE ER 2 MG PO CP24: 2.0000 mg | ORAL_CAPSULE | Freq: Every day | ORAL | 3 refills | Status: DC

## 2016-08-09 MED ORDER — ATORVASTATIN CALCIUM 10 MG PO TABS
10.0000 mg | ORAL_TABLET | Freq: Every day | ORAL | 3 refills | Status: DC
Start: 2016-08-09 — End: 2017-10-09

## 2016-08-09 MED ORDER — PREDNISONE 10 MG PO TABS: ORAL_TABLET | ORAL | 0 refills | Status: DC

## 2016-08-09 MED ORDER — OMEPRAZOLE 20 MG PO CPDR: 20.0000 mg | DELAYED_RELEASE_CAPSULE | Freq: Every day | ORAL | 3 refills | Status: DC

## 2016-08-09 MED ORDER — HYDROCODONE-HOMATROPINE 5-1.5 MG/5ML PO SYRP: 5.0000 mL | ORAL_SOLUTION | Freq: Four times a day (QID) | ORAL | 0 refills | Status: DC | PRN

## 2016-08-09 NOTE — Assessment & Plan Note (Signed)
stable overall by history and exam, recent data reviewed with pt, and pt to continue medical treatment as before,  to f/u any worsening symptoms or concerns Lab Results  Component Value Date   LDLCALC 68 07/21/2012

## 2016-08-09 NOTE — Assessment & Plan Note (Signed)
stable overall by history and exam, recent data reviewed with pt, and pt to continue medical treatment as before,  to f/u any worsening symptoms or concerns  

## 2016-08-09 NOTE — Assessment & Plan Note (Signed)
stable overall by history and exam, recent data reviewed with pt, and pt to continue medical treatment as before,  to f/u any worsening symptoms or concerns Lab Results  Component Value Date   HGBA1C 6.0 09/26/2015

## 2016-08-09 NOTE — Assessment & Plan Note (Signed)
c/w bronchospasm, for depomedro IM 80, and predpac asd,  to f/u any worsening symptoms or concerns

## 2016-08-09 NOTE — Patient Instructions (Addendum)
You had the steroid shot today  Please take all new medication as prescribed - the antibiotic, cough medicine, and prednisone  You can also take Mucinex (or it's generic off brand) for congestion, and tylenol as needed for pain.  Please continue all other medications as before, and refills have been done if requested.  Please have the pharmacy call with any other refills you may need.  Please continue your efforts at being more active, low cholesterol diet, and weight control.  You are otherwise up to date with prevention measures today.  Please keep your appointments with your specialists as you may have planned  Please go to the LAB in the Basement (turn left off the elevator) for the tests to be done today  You will be contacted by phone if any changes need to be made immediately.  Otherwise, you will receive a letter about your results with an explanation, but please check with MyChart first.  Please remember to sign up for MyChart if you have not done so, as this will be important to you in the future with finding out test results, communicating by private email, and scheduling acute appointments online when needed.  Please return in 1 year for your yearly visit, or sooner if needed, with Lab testing done 3-5 days before  OK to cancel the return visit in May

## 2016-08-09 NOTE — Progress Notes (Signed)
Subjective:    [name], female    DOB: 06-Jan-1951, 66 y.o.   MRN: [id number]  HPI  Here for wellness and f/u;  Overall doing ok;  Pt denies Chest pain, worsening SOB, DOE, wheezing, orthopnea, PND, worsening LE edema, palpitations, dizziness or syncope.  Pt denies neurological change such as new headache, facial or extremity weakness.  Pt denies polydipsia, polyuria, or low sugar symptoms. Pt states overall good compliance with treatment and medications, good tolerability, and has been trying to follow appropriate diet.  Pt denies worsening depressive symptoms, suicidal ideation or panic. No fever, night sweats, wt loss, loss of appetite, or other constitutional symptoms.  Pt states good ability with ADL's, has low fall risk, home safety reviewed and adequate, no other significant changes in hearing or vision, and only occasionally active with exercise.  Wt Readings from Last 3 Encounters:  08/09/16 146 lb (66.2 kg)  07/23/16 147 lb (66.7 kg)  02/23/16 143 lb (64.9 kg)   BP Readings from Last 3 Encounters:  08/09/16 132/78  07/23/16 126/82  02/23/16 122/82  Also Here with acute onset mild to mod 2-3 days ST, HA, general weakness and malaise, with prod cough greenish sputum, but Pt denies chest pain.   Past Medical History:  Diagnosis Date  . ALLERGIC RHINITIS   . [name]   . ANXIETY   . ASTHMA   . COMMON MIGRAINE   . DEPRESSION   . DIVERTICULOSIS, COLON   . GERD (gastroesophageal reflux disease) 09/26/2015  . HYPERLIPIDEMIA   . [name]   . Mild mitral regurgitation by prior echocardiogram    Past Surgical History:  Procedure Laterality Date  . ABDOMINAL HYSTERECTOMY    . APPENDECTOMY      reports that she has quit smoking. She has never used smokeless tobacco. She reports that she does not drink alcohol or use drugs. family history includes Heart disease in her brother and father. Allergies  Allergen Reactions  . Nefazodone    . Trazodone And Nefazodone    Current Outpatient Prescriptions on File Prior to Visit  Medication Sig Dispense Refill  . aspirin EC 81 MG tablet Take 1 tablet (81 mg total) by mouth daily. 90 tablet 11  . Calcium Carbonate-Vit D-Min (CALCIUM 1200 PO) Take 1 capsule by mouth daily.    Marland Kitchen FABIOR 0.1 % FOAM apply TO FACE EVERY DAY AT BEDTIME over moisturizer  5  . Investigational vitamin D 600 UNITS capsule SWOG S0812 Take 1,200 Units by mouth daily. Take with food.    . linaclotide (LINZESS) 145 MCG CAPS capsule Take 1 capsule (145 mcg total) by mouth daily before breakfast. 4 capsule 0  . MINIVELLE 0.05 MG/24HR patch      No current facility-administered medications on file prior to visit.    Review of Systems Constitutional: Negative for increased diaphoresis, or other activity, appetite or siginficant weight change other than noted HENT: Negative for worsening hearing loss, ear pain, facial swelling, mouth sores and neck stiffness.   Eyes: Negative for other worsening pain, redness or visual disturbance.  Respiratory: Negative for choking or stridor Cardiovascular: Negative for other chest pain and palpitations.  Gastrointestinal: Negative for worsening diarrhea, blood in stool, or abdominal distention Genitourinary: Negative for hematuria, flank pain or change in urine volume.  Musculoskeletal: Negative for myalgias or other joint complaints.  Skin: Negative for other color change and wound or drainage.  Neurological: Negative for syncope and numbness. other than noted Hematological: Negative  for adenopathy. or other swelling Psychiatric/Behavioral: Negative for hallucinations, SI, self-injury, decreased concentration or other worsening agitation.  All other system neg per pt    Objective:   Physical Exam BP 132/78   Pulse (!) 101   Temp 99.8 F (37.7 C)   Ht 5\' 1"  (1.549 m)   Wt 146 lb (66.2 kg)   SpO2 96%   BMI 27.59 kg/m  VS noted, mild ill, no accessory muscle  use Constitutional: Pt is oriented to person, place, and time. Appears well-developed and well-nourished, in no significant distress Head: Normocephalic and atraumatic  Eyes: Conjunctivae and EOM are normal. Pupils are equal, round, and reactive to light Right Ear: External ear normal.  Left Ear: External ear normal Nose: Nose normal.  Mouth/Throat: Oropharynx is clear and moist  Bilat tm's with mild erythema.  Max sinus areas mild tender.  Pharynx with mild erythema, no exudate Neck: Normal range of motion. Neck supple. No JVD present. No tracheal deviation present but has mild significant neck LA, but no other mass Cardiovascular: Normal rate, regular rhythm, normal heart sounds and intact distal pulses.   Pulmonary/Chest: Effort normal and breath sounds decreased without rales but with mild bilat  wheezing  Abdominal: Soft. Bowel sounds are normal. NT. No HSM  Musculoskeletal: Normal range of motion. Exhibits no edema Lymphadenopathy: Has no cervical adenopathy.  Neurological: Pt is alert and oriented to person, place, and time. Pt has normal reflexes. No cranial nerve deficit. Motor grossly intact Skin: Skin is warm and dry. No rash noted or new ulcers Psychiatric:  Has normal mood and affect. Behavior is normal.  No other exam findings    Assessment & Plan:

## 2016-08-09 NOTE — Assessment & Plan Note (Signed)

## 2016-08-09 NOTE — Assessment & Plan Note (Signed)
Mild to mod, c/w bronchitis vs pna, for antibx course, cough med prn,  to f/u any worsening symptoms or concerns 

## 2016-08-19 ENCOUNTER — Other Ambulatory Visit: Payer: Self-pay | Admitting: Internal Medicine

## 2016-08-19 NOTE — Telephone Encounter (Signed)
Is this refill appropriate? It was just filled on 08/09/16. Please advise.

## 2016-08-20 ENCOUNTER — Other Ambulatory Visit (INDEPENDENT_AMBULATORY_CARE_PROVIDER_SITE_OTHER): Payer: BC Managed Care – PPO

## 2016-08-20 DIAGNOSIS — R739 Hyperglycemia, unspecified: Secondary | ICD-10-CM | POA: Diagnosis not present

## 2016-08-20 DIAGNOSIS — R7989 Other specified abnormal findings of blood chemistry: Secondary | ICD-10-CM

## 2016-08-20 DIAGNOSIS — Z0001 Encounter for general adult medical examination with abnormal findings: Secondary | ICD-10-CM | POA: Diagnosis not present

## 2016-08-20 LAB — LIPID PANEL
CHOLESTEROL: 174 mg/dL (ref 0–200)
HDL: 54.5 mg/dL (ref >39.00)
NonHDL: 119.95
TRIGLYCERIDES: 317 mg/dL — AB (ref 0.0–149.0)
Total CHOL/HDL Ratio: 3
VLDL: 63.4 mg/dL — AB (ref 0.0–40.0)

## 2016-08-20 LAB — CBC WITH DIFFERENTIAL/PLATELET
BASOS PCT: 0.7 % (ref 0.0–3.0)
Basophils Absolute: 0.1 10*3/uL (ref 0.0–0.1)
EOS PCT: 2.9 % (ref 0.0–5.0)
Eosinophils Absolute: 0.3 10*3/uL (ref 0.0–0.7)
HEMATOCRIT: 39 % (ref 36.0–46.0)
Hemoglobin: 13 g/dL (ref 12.0–15.0)
Lymphocytes Relative: 30.3 % (ref 12.0–46.0)
Lymphs Abs: 3.4 10*3/uL (ref 0.7–4.0)
MCHC: 33.3 g/dL (ref 30.0–36.0)
MCV: 85.8 fl (ref 78.0–100.0)
MONOS PCT: 5.7 % (ref 3.0–12.0)
Monocytes Absolute: 0.6 10*3/uL (ref 0.1–1.0)
NEUTROS ABS: 6.7 10*3/uL (ref 1.4–7.7)
Neutrophils Relative %: 60.4 % (ref 43.0–77.0)
PLATELETS: 363 10*3/uL (ref 150.0–400.0)
RBC: 4.54 Mil/uL (ref 3.87–5.11)
RDW: 14.1 % (ref 11.5–15.5)
WBC: 11.2 10*3/uL — ABNORMAL HIGH (ref 4.0–10.5)

## 2016-08-20 LAB — URINALYSIS, ROUTINE W REFLEX MICROSCOPIC
Bilirubin Urine: NEGATIVE
HGB URINE DIPSTICK: NEGATIVE
Ketones, ur: NEGATIVE
Leukocytes, UA: NEGATIVE
NITRITE: NEGATIVE
RBC / HPF: NONE SEEN (ref 0–?)
Specific Gravity, Urine: 1.025 (ref 1.000–1.030)
Total Protein, Urine: NEGATIVE
URINE GLUCOSE: NEGATIVE
Urobilinogen, UA: 0.2 (ref 0.0–1.0)
WBC UA: NONE SEEN (ref 0–?)
pH: 5.5 (ref 5.0–8.0)

## 2016-08-20 LAB — HEPATIC FUNCTION PANEL
ALT: 17 U/L (ref 0–35)
AST: 15 U/L (ref 0–37)
Albumin: 4 g/dL (ref 3.5–5.2)
Alkaline Phosphatase: 92 U/L (ref 39–117)
BILIRUBIN DIRECT: 0.1 mg/dL (ref 0.0–0.3)
BILIRUBIN TOTAL: 0.4 mg/dL (ref 0.2–1.2)
Total Protein: 6.8 g/dL (ref 6.0–8.3)

## 2016-08-20 LAB — BASIC METABOLIC PANEL
BUN: 15 mg/dL (ref 6–23)
CALCIUM: 8.7 mg/dL (ref 8.4–10.5)
CO2: 29 mEq/L (ref 19–32)
CREATININE: 0.75 mg/dL (ref 0.40–1.20)
Chloride: 104 mEq/L (ref 96–112)
GFR: 82.44 mL/min (ref >60.00)
GLUCOSE: 98 mg/dL (ref 70–99)
Potassium: 3.8 mEq/L (ref 3.5–5.1)
Sodium: 139 mEq/L (ref 135–145)

## 2016-08-20 LAB — TSH: TSH: 2.34 u[IU]/mL (ref 0.35–4.50)

## 2016-08-20 LAB — LDL CHOLESTEROL, DIRECT: LDL DIRECT: 80 mg/dL

## 2016-08-20 LAB — HEMOGLOBIN A1C: HEMOGLOBIN A1C: 6.1 % (ref 4.6–6.5)

## 2016-08-20 NOTE — Telephone Encounter (Signed)
Ok for refill but would need ROV for further

## 2016-08-29 ENCOUNTER — Telehealth: Payer: Self-pay | Admitting: Internal Medicine

## 2016-08-29 MED ORDER — AMITRIPTYLINE HCL 50 MG PO TABS: 50.0000 mg | ORAL_TABLET | Freq: Every evening | ORAL | 0 refills | Status: DC | PRN

## 2016-08-29 NOTE — Telephone Encounter (Signed)
I sent the ONE PILL only to pharmacy, as this is what was requested

## 2016-08-29 NOTE — Telephone Encounter (Signed)
Pt called in and said that she is on vacation and left her sleeping meds at home and wanted to know if we could send 1 pill to  [facility]  [address] [place] [facility] [place]

## 2016-09-10 ENCOUNTER — Ambulatory Visit (INDEPENDENT_AMBULATORY_CARE_PROVIDER_SITE_OTHER): Payer: Medicare Other | Admitting: Internal Medicine

## 2016-09-10 ENCOUNTER — Encounter: Payer: Self-pay | Admitting: Internal Medicine

## 2016-09-10 VITALS — BP 130/64 | HR 84 | Wt 142.0 lb

## 2016-09-10 DIAGNOSIS — R05 Cough: Secondary | ICD-10-CM

## 2016-09-10 DIAGNOSIS — R062 Wheezing: Secondary | ICD-10-CM | POA: Diagnosis not present

## 2016-09-10 DIAGNOSIS — M1811 Unilateral primary osteoarthritis of first carpometacarpal joint, right hand: Secondary | ICD-10-CM

## 2016-09-10 DIAGNOSIS — R059 Cough, unspecified: Secondary | ICD-10-CM

## 2016-09-10 DIAGNOSIS — M545 Low back pain, unspecified: Secondary | ICD-10-CM

## 2016-09-10 MED ORDER — AZITHROMYCIN 250 MG PO TABS: ORAL_TABLET | ORAL | 1 refills | Status: DC

## 2016-09-10 MED ORDER — PREDNISONE 10 MG PO TABS: ORAL_TABLET | ORAL | 0 refills | Status: DC

## 2016-09-10 MED ORDER — DICLOFENAC SODIUM 1 % TD GEL: 4.0000 g | Freq: Four times a day (QID) | TRANSDERMAL | 11 refills | Status: DC | PRN

## 2016-09-10 MED ORDER — HYDROCODONE-HOMATROPINE 5-1.5 MG/5ML PO SYRP: 5.0000 mL | ORAL_SOLUTION | Freq: Four times a day (QID) | ORAL | 0 refills | Status: AC | PRN

## 2016-09-10 NOTE — Progress Notes (Signed)
Subjective:    [name], female    DOB: [birth year], 66 y.o.   MRN: [id number]  HPI  Here after recent illness improved but now similar - Here with acute onset mild to mod 6-7 days ST, HA, general weakness and malaise, with prod cough greenish sputum with wheezing/sob/doe.with several areas of sharp pleuritic back pains  but Pt denies chest pain, increased sob or doe, orthopnea, PND, increased LE swelling, palpitations, dizziness or syncope.  Also has worsening 1 mo hand DJD pain for which pennsaid helped previously but is not covered by insurance.  Pt denies new neurological symptoms such as new headache, or facial or extremity weakness or numbness   Pt denies polydipsia, polyuria Past Medical History:  Diagnosis Date  . ALLERGIC RHINITIS   . [name]   . ANXIETY   . ASTHMA   . COMMON MIGRAINE   . DEPRESSION   . DIVERTICULOSIS, COLON   . GERD (gastroesophageal reflux disease) 09/26/2015  . HYPERLIPIDEMIA   . [name]   . Mild mitral regurgitation by prior echocardiogram    Past Surgical History:  Procedure Laterality Date  . ABDOMINAL HYSTERECTOMY    . APPENDECTOMY      reports that she has quit smoking. She has never used smokeless tobacco. She reports that she does not drink alcohol or use drugs. family history includes Heart disease in her brother and father. Allergies  Allergen Reactions  . Nefazodone   . Trazodone And Nefazodone    Current Outpatient Prescriptions on File Prior to Visit  Medication Sig Dispense Refill  . amitriptyline (ELAVIL) 50 MG tablet Take 1 tablet (50 mg total) by mouth at bedtime as needed. Yearly physical due in May must make appt for future refills 1 tablet 0  . aspirin EC 81 MG tablet Take 1 tablet (81 mg total) by mouth daily. 90 tablet 11  . atorvastatin (LIPITOR) 10 MG tablet Take 1 tablet (10 mg total) by mouth daily. Yearly physical due in may must see MD for refills 90 tablet 3  .  butalbital-acetaminophen-caffeine (FIORICET, ESGIC) 50-325-40 MG tablet TAKE 1 TABLET TWICE DAILY AS NEEDED FOR HEADACHE. 60 tablet 0  . Calcium Carbonate-Vit D-Min (CALCIUM 1200 PO) Take 1 capsule by mouth daily.    Marland Kitchen FABIOR 0.1 % FOAM apply TO FACE EVERY DAY AT BEDTIME over moisturizer  5  . Investigational vitamin D 600 UNITS capsule SWOG S0812 Take 1,200 Units by mouth daily. Take with food.    . linaclotide (LINZESS) 145 MCG CAPS capsule Take 1 capsule (145 mcg total) by mouth daily before breakfast. 4 capsule 0  . MINIVELLE 0.05 MG/24HR patch     . omeprazole (PRILOSEC) 20 MG capsule Take 1 capsule (20 mg total) by mouth daily. 90 capsule 3  . tolterodine (DETROL LA) 2 MG 24 hr capsule Take 1 capsule (2 mg total) by mouth daily. 90 capsule 3   No current facility-administered medications on file prior to visit.    Review of Systems  Constitutional: Negative for other unusual diaphoresis or sweats HENT: Negative for ear discharge or swelling Eyes: Negative for other worsening visual disturbances Respiratory: Negative for stridor or other swelling  Gastrointestinal: Negative for worsening distension or other blood Genitourinary: Negative for retention or other urinary change Musculoskeletal: Negative for other MSK pain or swelling Skin: Negative for color change or other new lesions Neurological: Negative for worsening tremors and other numbness  Psychiatric/Behavioral: Negative for worsening agitation or other fatigue All  other system neg per pt    Objective:   Physical Exam BP 130/64   Pulse 84   Wt 142 lb (64.4 kg)   SpO2 98%   BMI 26.83 kg/m  VS noted, mild ill Constitutional: Pt appears in NAD HENT: Head: NCAT.  Right Ear: External ear normal.  Left Ear: External ear normal.  Eyes: . Pupils are equal, round, and reactive to light. Conjunctivae and EOM are normal Bilat tm's with mild erythema.  Max sinus areas non tender.  Pharynx with mild erythema, no exudate Nose:  without d/c or deformity Neck: Neck supple. Gross normal ROM Cardiovascular: Normal rate and regular rhythm.   Pulmonary/Chest: Effort normal and breath sounds decreased without rales but with mild scattered wheezing.  Neurological: Pt is alert. At baseline orientation, motor grossly intact Skin: Skin is warm. No rashes, other new lesions, no LE edema + tender bouchard nodes Psychiatric: Pt behavior is normal without agitation  No other exam findings    Assessment & Plan:

## 2016-09-10 NOTE — Patient Instructions (Signed)
Please take all new medication as prescribed - the antibiotic, cough medicine if needed, prednisone, and the Voltaren gel for pain  Please continue all other medications as before, and refills have been done if requested.  Please have the pharmacy call with any other refills you may need.  Please keep your appointments with your specialists as you may have planned

## 2016-09-10 NOTE — Assessment & Plan Note (Signed)
Most likely msk it seems, no neuro changes, for tylenol prn

## 2016-09-10 NOTE — Assessment & Plan Note (Signed)
/  Mild to mod, for predpac asd,  to f/u any worsening symptoms or concerns 

## 2016-09-10 NOTE — Assessment & Plan Note (Signed)
Mild to mod, for voltaren gel prn,  to f/u any worsening symptoms or concerns  

## 2016-09-10 NOTE — Progress Notes (Signed)
Pre visit review using our clinic review tool, if applicable. No additional management support is needed unless otherwise documented below in the visit note. 

## 2016-09-10 NOTE — Assessment & Plan Note (Signed)
Mild to mod, c/w bronchitis vs pna, declines cxr, for antibx course, cough med prn,  to f/u any worsening symptoms or concerns 

## 2016-09-12 ENCOUNTER — Telehealth: Payer: Self-pay | Admitting: *Deleted

## 2016-09-12 NOTE — Telephone Encounter (Signed)
Notified [place] spoke w/Jalah gave approval status.....[name]

## 2016-09-12 NOTE — Telephone Encounter (Signed)
Rec'd msg pt needing PA on the Voltaren Gel [employer] Member ID [id number]. Completed PA on cover-my-meds gave response  Asra Quizon (Key: [id number])- [employer] is reviewing your PA request. Typically an electronic response will be received within 72 hours...[name]

## 2016-09-12 NOTE — Telephone Encounter (Signed)
PA has been approved through 05/26/17.

## 2016-10-02 ENCOUNTER — Ambulatory Visit (INDEPENDENT_AMBULATORY_CARE_PROVIDER_SITE_OTHER): Payer: Medicare Other | Admitting: Internal Medicine

## 2016-10-02 VITALS — BP 120/76 | HR 84 | Ht 61.0 in | Wt 144.0 lb

## 2016-10-02 DIAGNOSIS — J45909 Unspecified asthma, uncomplicated: Secondary | ICD-10-CM

## 2016-10-02 DIAGNOSIS — I10 Essential (primary) hypertension: Secondary | ICD-10-CM

## 2016-10-02 DIAGNOSIS — R0781 Pleurodynia: Secondary | ICD-10-CM

## 2016-10-02 MED ORDER — LIDOCAINE 5 % EX PTCH: 1.0000 | MEDICATED_PATCH | CUTANEOUS | 0 refills | Status: DC

## 2016-10-02 MED ORDER — HYDROCODONE-ACETAMINOPHEN 5-325 MG PO TABS: 1.0000 | ORAL_TABLET | Freq: Four times a day (QID) | ORAL | 0 refills | Status: DC | PRN

## 2016-10-02 NOTE — Patient Instructions (Addendum)
Please take all new medication as prescribed - the pain patch, as well as the hydrocodone if needed  Please continue all other medications as before, and refills have been done if requested.  Please have the pharmacy call with any other refills you may need.  Please keep your appointments with your specialists as you may have planned  Please go to the XRAY Department in the Basement (go straight as you get off the elevator) for the x-ray testing toimorrow  You will be contacted by phone if any changes need to be made immediately.  Otherwise, you will receive a letter about your results with an explanation, but please check with MyChart first.  Please remember to sign up for MyChart if you have not done so, as this will be important to you in the future with finding out test results, communicating by private email, and scheduling acute appointments online when needed.

## 2016-10-02 NOTE — Progress Notes (Signed)
Subjective:    [name], female    DOB: 1950/12/27, 66 y.o.   MRN: [id number]  HPI  Here with left lateral CP after a mistep going downstairs, stumbled and fell hard on metal edge of washing machine yesterday, now with persistent moderate pain to left lateral lower chest wall, sharp, pleuritic, worse to breathe, better to lie down, nothing else makes better or worse. Pt denies other chest pain, increased sob or doe, wheezing, orthopnea, PND, increased LE swelling, palpitations, dizziness or syncope.  No cough, fever, ST or congestion.   Past Medical History:  Diagnosis Date  . ALLERGIC RHINITIS   . [name]   . ANXIETY   . ASTHMA   . COMMON MIGRAINE   . DEPRESSION   . DIVERTICULOSIS, COLON   . GERD (gastroesophageal reflux disease) 09/26/2015  . HYPERLIPIDEMIA   . [name]   . Mild mitral regurgitation by prior echocardiogram    Past Surgical History:  Procedure Laterality Date  . ABDOMINAL HYSTERECTOMY    . APPENDECTOMY      reports that she has quit smoking. She has never used smokeless tobacco. She reports that she does not drink alcohol or use drugs. family history includes Heart disease in her brother and father. Allergies  Allergen Reactions  . Nefazodone   . Trazodone And Nefazodone    Current Outpatient Prescriptions on File Prior to Visit  Medication Sig Dispense Refill  . amitriptyline (ELAVIL) 50 MG tablet Take 1 tablet (50 mg total) by mouth at bedtime as needed. Yearly physical due in May must make appt for future refills 1 tablet 0  . aspirin EC 81 MG tablet Take 1 tablet (81 mg total) by mouth daily. 90 tablet 11  . atorvastatin (LIPITOR) 10 MG tablet Take 1 tablet (10 mg total) by mouth daily. Yearly physical due in may must see MD for refills 90 tablet 3  . butalbital-acetaminophen-caffeine (FIORICET, ESGIC) 50-325-40 MG tablet TAKE 1 TABLET TWICE DAILY AS NEEDED FOR HEADACHE. 60 tablet 0  . Calcium  Carbonate-Vit D-Min (CALCIUM 1200 PO) Take 1 capsule by mouth daily.    . diclofenac sodium (VOLTAREN) 1 % GEL Apply 4 g topically 4 (four) times daily as needed. 400 g 11  . FABIOR 0.1 % FOAM apply TO FACE EVERY DAY AT BEDTIME over moisturizer  5  . Investigational vitamin D 600 UNITS capsule SWOG S0812 Take 1,200 Units by mouth daily. Take with food.    . linaclotide (LINZESS) 145 MCG CAPS capsule Take 1 capsule (145 mcg total) by mouth daily before breakfast. 4 capsule 0  . MINIVELLE 0.05 MG/24HR patch     . omeprazole (PRILOSEC) 20 MG capsule Take 1 capsule (20 mg total) by mouth daily. 90 capsule 3  . tolterodine (DETROL LA) 2 MG 24 hr capsule Take 1 capsule (2 mg total) by mouth daily. 90 capsule 3   No current facility-administered medications on file prior to visit.     Review of Systems  Constitutional: Negative for other unusual diaphoresis or sweats HENT: Negative for ear discharge or swelling Eyes: Negative for other worsening visual disturbances Respiratory: Negative for stridor or other swelling  Gastrointestinal: Negative for worsening distension or other blood Genitourinary: Negative for retention or other urinary change Musculoskeletal: Negative for other MSK pain or swelling Skin: Negative for color change or other new lesions Neurological: Negative for worsening tremors and other numbness  Psychiatric/Behavioral: Negative for worsening agitation or other fatigue All other system neg  per pt    Objective:   Physical Exam BP 120/76   Pulse 84   Ht 5\' 1"  (1.549 m)   Wt 144 lb (65.3 kg)   SpO2 97%   BMI 27.21 kg/m  VS noted,  Constitutional: Pt appears in NAD HENT: Head: NCAT.  Right Ear: External ear normal.  Left Ear: External ear normal.  Eyes: . Pupils are equal, round, and reactive to light. Conjunctivae and EOM are normal Nose: without d/c or deformity Neck: Neck supple. Gross normal ROM Cardiovascular: Normal rate and regular rhythm.   Pulmonary/Chest:  Effort normal and breath sounds without rales or wheezing.  Left lateral ribs anterior axillary line at about t10 tender Abd:  Soft, NT, ND, + BS, no organomegaly Neurological: Pt is alert. At baseline orientation, motor grossly intact Skin: Skin is warm. No rashes, other new lesions, no LE edema Psychiatric: Pt behavior is normal without agitation  No other exam findings    Assessment & Plan:

## 2016-10-03 ENCOUNTER — Ambulatory Visit (INDEPENDENT_AMBULATORY_CARE_PROVIDER_SITE_OTHER)
Admission: RE | Admit: 2016-10-03 | Discharge: 2016-10-03 | Disposition: A | Discharge status: Home / Self Care | Payer: Medicare Other | Source: Ambulatory Visit | Attending: Internal Medicine | Admitting: Internal Medicine

## 2016-10-03 DIAGNOSIS — R0781 Pleurodynia: Secondary | ICD-10-CM

## 2016-10-03 NOTE — Assessment & Plan Note (Addendum)
Suspect deep bruising with no outward signs, cant r/o fx - for rib films/cxr, pain control with lidoderm/hydrocodone prn,  to f/u any worsening symptoms or concerns

## 2016-10-03 NOTE — Assessment & Plan Note (Signed)
stable overall by history and exam, recent data reviewed with pt, and pt to continue medical treatment as before,  to f/u any worsening symptoms or concerns @LASTSAO2(3)@  

## 2016-10-03 NOTE — Assessment & Plan Note (Signed)
stable overall by history and exam, recent data reviewed with pt, and pt to continue medical treatment as before,  to f/u any worsening symptoms or concerns BP Readings from Last 3 Encounters:  10/02/16 120/76  09/10/16 130/64  08/09/16 132/78

## 2017-02-11 ENCOUNTER — Ambulatory Visit (INDEPENDENT_AMBULATORY_CARE_PROVIDER_SITE_OTHER): Payer: Medicare Other | Admitting: Internal Medicine

## 2017-02-11 ENCOUNTER — Encounter: Payer: Self-pay | Admitting: Internal Medicine

## 2017-02-11 VITALS — BP 112/70 | HR 99 | Temp 98.5°F | Ht 61.0 in | Wt 136.0 lb

## 2017-02-11 DIAGNOSIS — I10 Essential (primary) hypertension: Secondary | ICD-10-CM

## 2017-02-11 DIAGNOSIS — R05 Cough: Secondary | ICD-10-CM

## 2017-02-11 DIAGNOSIS — R059 Cough, unspecified: Secondary | ICD-10-CM

## 2017-02-11 DIAGNOSIS — R739 Hyperglycemia, unspecified: Secondary | ICD-10-CM | POA: Diagnosis not present

## 2017-02-11 MED ORDER — LEVOFLOXACIN 250 MG PO TABS: 250.0000 mg | ORAL_TABLET | Freq: Every day | ORAL | 0 refills | Status: AC

## 2017-02-11 MED ORDER — HYDROCODONE-HOMATROPINE 5-1.5 MG/5ML PO SYRP: 5.0000 mL | ORAL_SOLUTION | Freq: Four times a day (QID) | ORAL | 0 refills | Status: AC | PRN

## 2017-02-11 NOTE — Patient Instructions (Signed)
Please take all new medication as prescribed - the antibiotic, and cough medicine if needed  Please continue all other medications as before, and refills have been done if requested.  Please have the pharmacy call with any other refills you may need.  Please keep your appointments with your specialists as you may have planned     

## 2017-02-11 NOTE — Assessment & Plan Note (Signed)
stable overall by history and exam, recent data reviewed with pt, and pt to continue medical treatment as before,  to f/u any worsening symptoms or concerns Lab Results  Component Value Date   HGBA1C 6.1 08/20/2016

## 2017-02-11 NOTE — Progress Notes (Signed)
Subjective:    [name], female    DOB: [birth year], 66 y.o.   MRN: [id number]  HPI  Here with acute onset mild to mod 2-3 days ST, HA, general weakness and malaise, with prod cough greenish sputum, but Pt denies chest pain, increased sob or doe, wheezing, orthopnea, PND, increased LE swelling, palpitations, dizziness or syncope.  Seemed to start with ST and only got worse.  Pt denies new neurological symptoms such as new headache, or facial or extremity weakness or numbness   Pt denies polydipsia, polyuria Past Medical History:  Diagnosis Date  . ALLERGIC RHINITIS   . [name]   . ANXIETY   . ASTHMA   . COMMON MIGRAINE   . DEPRESSION   . DIVERTICULOSIS, COLON   . GERD (gastroesophageal reflux disease) 09/26/2015  . HYPERLIPIDEMIA   . [name]   . Mild mitral regurgitation by prior echocardiogram    Past Surgical History:  Procedure Laterality Date  . ABDOMINAL HYSTERECTOMY    . APPENDECTOMY      reports that she has quit smoking. She has never used smokeless tobacco. She reports that she does not drink alcohol or use drugs. family history includes Heart disease in her brother and father. Allergies  Allergen Reactions  . Nefazodone   . Trazodone And Nefazodone    Current Outpatient Prescriptions on File Prior to Visit  Medication Sig Dispense Refill  . amitriptyline (ELAVIL) 50 MG tablet Take 1 tablet (50 mg total) by mouth at bedtime as needed. Yearly physical due in May must make appt for future refills 1 tablet 0  . aspirin EC 81 MG tablet Take 1 tablet (81 mg total) by mouth daily. 90 tablet 11  . atorvastatin (LIPITOR) 10 MG tablet Take 1 tablet (10 mg total) by mouth daily. Yearly physical due in may must see MD for refills 90 tablet 3  . butalbital-acetaminophen-caffeine (FIORICET, ESGIC) 50-325-40 MG tablet TAKE 1 TABLET TWICE DAILY AS NEEDED FOR HEADACHE. 60 tablet 0  . Calcium Carbonate-Vit D-Min (CALCIUM 1200 PO) Take  1 capsule by mouth daily.    . diclofenac sodium (VOLTAREN) 1 % GEL Apply 4 g topically 4 (four) times daily as needed. 400 g 11  . FABIOR 0.1 % FOAM apply TO FACE EVERY DAY AT BEDTIME over moisturizer  5  . HYDROcodone-acetaminophen (NORCO/VICODIN) 5-325 MG tablet Take 1 tablet by mouth every 6 (six) hours as needed for moderate pain. 30 tablet 0  . Investigational vitamin D 600 UNITS capsule SWOG S0812 Take 1,200 Units by mouth daily. Take with food.    . lidocaine (LIDODERM) 5 % Place 1 patch onto the skin daily. Remove & Discard patch within 12 hours or as directed by MD 30 patch 0  . linaclotide (LINZESS) 145 MCG CAPS capsule Take 1 capsule (145 mcg total) by mouth daily before breakfast. 4 capsule 0  . MINIVELLE 0.05 MG/24HR patch     . omeprazole (PRILOSEC) 20 MG capsule Take 1 capsule (20 mg total) by mouth daily. 90 capsule 3  . tolterodine (DETROL LA) 2 MG 24 hr capsule Take 1 capsule (2 mg total) by mouth daily. 90 capsule 3   No current facility-administered medications on file prior to visit.    Review of Systems  Constitutional: Negative for other unusual diaphoresis or sweats HENT: Negative for ear discharge or swelling Eyes: Negative for other worsening visual disturbances Respiratory: Negative for stridor or other swelling  Gastrointestinal: Negative for worsening distension or  other blood Genitourinary: Negative for retention or other urinary change Musculoskeletal: Negative for other MSK pain or swelling Skin: Negative for color change or other new lesions Neurological: Negative for worsening tremors and other numbness  Psychiatric/Behavioral: Negative for worsening agitation or other fatigue All other system neg per pt    Objective:   Physical Exam BP 112/70   Pulse 99   Temp 98.5 F (36.9 C) (Oral)   Ht  (1.549 m)   Wt 136 lb (61.7 kg)   SpO2 97%   BMI 25.70 kg/m  VS noted, mild ill Constitutional: Pt appears in NAD HENT: Head: NCAT.  Right Ear:  External ear normal.  Left Ear: External ear normal.  Eyes: . Pupils are equal, round, and reactive to light. Conjunctivae and EOM are normal Nose: without d/c or deformity Bilat tm's with mild erythema.  Max sinus areas non tender.  Pharynx with mild erythema, no exudate Neck: Neck supple. Gross normal ROM Cardiovascular: Normal rate and regular rhythm.   Pulmonary/Chest: Effort normal and breath sounds decreased and course without rales or wheezing.  Abd:  Soft, NT, ND, + BS, no organomegaly Neurological: Pt is alert. At baseline orientation, motor grossly intact Skin: Skin is warm. No rashes, other new lesions, no LE edema Psychiatric: Pt behavior is normal without agitation         Assessment & Plan:

## 2017-02-11 NOTE — Assessment & Plan Note (Signed)
Mild to mod, c/w bronchitis vs pna, declines cxr, for antibx course, to f/u any worsening symptoms or concerns  

## 2017-02-11 NOTE — Assessment & Plan Note (Signed)
stable overall by history and exam, recent data reviewed with pt, and pt to continue medical treatment as before,  to f/u any worsening symptoms or concerns BP Readings from Last 3 Encounters:  02/11/17 112/70  10/02/16 120/76  09/10/16 130/64

## 2017-04-20 ENCOUNTER — Other Ambulatory Visit: Payer: Self-pay | Admitting: Internal Medicine

## 2017-07-23 ENCOUNTER — Ambulatory Visit: Payer: Medicare Other | Admitting: Family

## 2017-07-23 ENCOUNTER — Encounter: Payer: Self-pay | Admitting: Family

## 2017-07-23 VITALS — BP 116/84 | HR 101 | Temp 101.1°F | Ht 61.0 in | Wt 145.0 lb

## 2017-07-23 DIAGNOSIS — J209 Acute bronchitis, unspecified: Secondary | ICD-10-CM | POA: Diagnosis not present

## 2017-07-23 MED ORDER — BENZONATATE 100 MG PO CAPS: 100.0000 mg | ORAL_CAPSULE | Freq: Three times a day (TID) | ORAL | 0 refills | Status: DC | PRN

## 2017-07-23 MED ORDER — CEFDINIR 300 MG PO CAPS: 300.0000 mg | ORAL_CAPSULE | Freq: Two times a day (BID) | ORAL | 0 refills | Status: DC

## 2017-07-23 NOTE — Progress Notes (Signed)
[name] is a [age] y.o. female with the following history as recorded in [employer]:  Patient Active Problem List   Diagnosis Date Noted  . Rib pain on left side 10/02/2016  . Hyperglycemia 08/09/2016  . GERD (gastroesophageal reflux disease) 09/26/2015  . Abnormal chest x-ray 09/06/2014  . Diverticulitis of colon without hemorrhage 04/20/2014  . Wheezing 04/20/2014  . Cough 04/06/2014  . Pain in the chest 02/24/2014  . Right wrist tendinitis 12/14/2013  . CMC arthritis, thumb, degenerative 12/14/2013  . Paresthesia 03/01/2013  . Other malaise and fatigue 03/01/2013  . Pain of right thumb 02/04/2013  . Palpitations 01/26/2013  . Lower gastrointestinal bleed 07/28/2012  . Lower abdominal pain 06/25/2012  . Back pain 07/29/2011  . Encounter for preventative adult health care exam with abnormal findings 07/26/2011  . Asthma 07/19/2008  . [name] 11/17/2007  . ANXIETY 11/17/2007  . Essential hypertension 11/17/2007  . DIVERTICULOSIS, COLON 11/17/2007  . [name] 11/17/2007  . Hyperlipidemia 01/26/2007  . DEPRESSION 01/24/2007  . Migraine without aura 01/24/2007  . ALLERGIC RHINITIS 01/24/2007    Current Outpatient Medications  Medication Sig Dispense Refill  . amitriptyline (ELAVIL) 50 MG tablet Take 1 tablet (50 mg total) by mouth at bedtime as needed. Yearly physical due in May must make appt for future refills 1 tablet 0  . aspirin EC 81 MG tablet Take 1 tablet (81 mg total) by mouth daily. 90 tablet 11  . atorvastatin (LIPITOR) 10 MG tablet Take 1 tablet (10 mg total) by mouth daily. Yearly physical due in may must see MD for refills 90 tablet 3  . butalbital-acetaminophen-caffeine (FIORICET, ESGIC) 50-325-40 MG tablet TAKE 1 TABLET TWICE DAILY AS NEEDED FOR HEADACHE. 60 tablet 0  . Calcium Carbonate-Vit D-Min (CALCIUM 1200 PO) Take 1 capsule by mouth daily.    . diclofenac sodium (VOLTAREN) 1 % GEL Apply 4 g topically 4 (four) times daily  as needed. 400 g 11  . estradiol (VIVELLE-DOT) 0.05 MG/24HR patch estradiol 0.05 mg/24 hr semiweekly transdermal patch  Apply patch twice weekly    . FABIOR 0.1 % FOAM apply TO FACE EVERY DAY AT BEDTIME over moisturizer  5  . Investigational vitamin D 600 UNITS capsule SWOG S0812 Take 1,200 Units by mouth daily. Take with food.    . linaclotide (LINZESS) 145 MCG CAPS capsule Take 1 capsule (145 mcg total) by mouth daily before breakfast. 4 capsule 0  . meloxicam (MOBIC) 15 MG tablet     . MINIVELLE 0.05 MG/24HR patch     . omeprazole (PRILOSEC) 20 MG capsule Take 1 capsule (20 mg total) by mouth daily. 90 capsule 3  . tolterodine (DETROL LA) 2 MG 24 hr capsule Take 1 capsule (2 mg total) by mouth daily. 90 capsule 3  . benzonatate (TESSALON) 100 MG capsule Take 1 capsule (100 mg total) by mouth 3 (three) times daily as needed. 20 capsule 0  . cefdinir (OMNICEF) 300 MG capsule Take 1 capsule (300 mg total) by mouth 2 (two) times daily. 20 capsule 0   No current facility-administered medications for this visit.     Allergies: Nefazodone and Trazodone and nefazodone  Past Medical History:  Diagnosis Date  . ALLERGIC RHINITIS   . [name]   . ANXIETY   . ASTHMA   . COMMON MIGRAINE   . DEPRESSION   . DIVERTICULOSIS, COLON   . GERD (gastroesophageal reflux disease) 09/26/2015  . HYPERLIPIDEMIA   . [name]   . Mild  mitral regurgitation by prior echocardiogram     Past Surgical History:  Procedure Laterality Date  . ABDOMINAL HYSTERECTOMY    . APPENDECTOMY      Family History  Problem Relation Age of Onset  . Heart disease Father   . Heart disease Brother   . Colon cancer Neg Hx     Social History   Tobacco Use  . Smoking status: Former Games developer  . Smokeless tobacco: Never Used  Substance Use Topics  . Alcohol use: No    Comment: occasional     Subjective:  Patient presents with 3 day history of cough/ congestion/ fever; started suddenly on  Monday; did not take a flu shot this year; denies any chest pain but does feel short of breath; not taking any medications for symptom relief; notes that she is prone to bronchitis; has not taken anything for fever because she was concerned about mixing with her Mobic;    Objective:  Vitals:   07/23/17 1029  BP: 116/84  Pulse: (!) 101  Temp: (!) 101.1 F (38.4 C)  TempSrc: Oral  SpO2: 95%  Weight: 145 lb 0.6 oz (65.8 kg)  Height: 5\' 1"  (1.549 m)    General: Well developed, well nourished, in no acute distress  Skin : Warm and dry.  Head: Normocephalic and atraumatic  Eyes: Sclera and conjunctiva clear; pupils round and reactive to light; extraocular movements intact  Ears: External normal; canals clear; tympanic membranes normal  Oropharynx: Pink, supple. No suspicious lesions  Neck: Supple without thyromegaly, adenopathy  Lungs: Respirations unlabored; clear to auscultation bilaterally without wheeze, rales, rhonchi  CVS exam: normal rate and regular rhythm.  Neurologic: Alert and oriented; speech intact; face symmetrical; moves all extremities well; CNII-XII intact without focal deficit   Assessment:  1. Acute bronchitis, unspecified organism     Plan:  Suspect original flu- like virus- now progressing into secondary bronchitis; Rx for Omnicef 300 mg bid x 10 days; Rx for Tessalon 100 mg tid prn; okay for her to use Hycodan cough syrup that she has at home; increase fluids, rest and follow-up worse, no better.   No Follow-up on file.  No orders of the defined types were placed in this encounter.   Requested Prescriptions   Signed Prescriptions Disp Refills  . cefdinir (OMNICEF) 300 MG capsule 20 capsule 0    Sig: Take 1 capsule (300 mg total) by mouth 2 (two) times daily.  . benzonatate (TESSALON) 100 MG capsule 20 capsule 0    Sig: Take 1 capsule (100 mg total) by mouth 3 (three) times daily as needed.

## 2017-08-11 ENCOUNTER — Encounter: Payer: Self-pay | Admitting: Family

## 2017-08-11 ENCOUNTER — Emergency Department (INDEPENDENT_AMBULATORY_CARE_PROVIDER_SITE_OTHER)
Admit: 2017-08-11 | Discharge: 2017-08-11 | Disposition: A | Discharge status: Home / Self Care | Payer: Medicare Other | Attending: Family | Admitting: Family

## 2017-08-11 ENCOUNTER — Ambulatory Visit: Payer: Medicare Other | Admitting: Family

## 2017-08-11 VITALS — BP 122/78 | HR 87 | Temp 98.4°F | Ht 61.0 in | Wt 145.1 lb

## 2017-08-11 DIAGNOSIS — R05 Cough: Secondary | ICD-10-CM | POA: Diagnosis not present

## 2017-08-11 DIAGNOSIS — R059 Cough, unspecified: Secondary | ICD-10-CM

## 2017-08-11 DIAGNOSIS — J209 Acute bronchitis, unspecified: Secondary | ICD-10-CM | POA: Diagnosis not present

## 2017-08-11 MED ORDER — LEVOFLOXACIN 500 MG PO TABS: 500.0000 mg | ORAL_TABLET | Freq: Every day | ORAL | 0 refills | Status: DC

## 2017-08-11 MED ORDER — HYDROCODONE-HOMATROPINE 5-1.5 MG/5ML PO SYRP: 5.0000 mL | ORAL_SOLUTION | Freq: Four times a day (QID) | ORAL | 0 refills | Status: AC | PRN

## 2017-08-11 NOTE — ED Notes (Signed)
Coming from PCP-states right ear swollen-R/O mastoiditis

## 2017-08-11 NOTE — Progress Notes (Signed)
[name] is a 67 y.o. female with the following history as recorded in Epic[employer]:  Patient Active Problem List   Diagnosis Date Noted  . Rib pain on left side 10/02/2016  . Hyperglycemia 08/09/2016  . GERD (gastroesophageal reflux disease) 09/26/2015  . Abnormal chest x-ray 09/06/2014  . Diverticulitis of colon without hemorrhage 04/20/2014  . Wheezing 04/20/2014  . Cough 04/06/2014  . Pain in the chest 02/24/2014  . Right wrist tendinitis 12/14/2013  . CMC arthritis, thumb, degenerative 12/14/2013  . Paresthesia 03/01/2013  . Other malaise and fatigue 03/01/2013  . Pain of right thumb 02/04/2013  . Palpitations 01/26/2013  . Lower gastrointestinal bleed 07/28/2012  . Lower abdominal pain 06/25/2012  . Back pain 07/29/2011  . Encounter for preventative adult health care exam with abnormal findings 07/26/2011  . Asthma 07/19/2008  . [name] 11/17/2007  . ANXIETY 11/17/2007  . Essential hypertension 11/17/2007  . DIVERTICULOSIS, COLON 11/17/2007  . [name] 11/17/2007  . Hyperlipidemia 01/26/2007  . DEPRESSION 01/24/2007  . Migraine without aura 01/24/2007  . ALLERGIC RHINITIS 01/24/2007    Current Outpatient Medications  Medication Sig Dispense Refill  . amitriptyline (ELAVIL) 50 MG tablet Take 1 tablet (50 mg total) by mouth at bedtime as needed. Yearly physical due in May must make appt for future refills 1 tablet 0  . aspirin EC 81 MG tablet Take 1 tablet (81 mg total) by mouth daily. 90 tablet 11  . atorvastatin (LIPITOR) 10 MG tablet Take 1 tablet (10 mg total) by mouth daily. Yearly physical due in may must see MD for refills 90 tablet 3  . benzonatate (TESSALON) 100 MG capsule Take 1 capsule (100 mg total) by mouth 3 (three) times daily as needed. 20 capsule 0  . butalbital-acetaminophen-caffeine (FIORICET, ESGIC) 50-325-40 MG tablet TAKE 1 TABLET TWICE DAILY AS NEEDED FOR HEADACHE. 60 tablet 0  . Calcium Carbonate-Vit D-Min  (CALCIUM 1200 PO) Take 1 capsule by mouth daily.    . diclofenac sodium (VOLTAREN) 1 % GEL Apply 4 g topically 4 (four) times daily as needed. 400 g 11  . estradiol (VIVELLE-DOT) 0.05 MG/24HR patch estradiol 0.05 mg/24 hr semiweekly transdermal patch  Apply patch twice weekly    . FABIOR 0.1 % FOAM apply TO FACE EVERY DAY AT BEDTIME over moisturizer  5  . Investigational vitamin D 600 UNITS capsule SWOG S0812 Take 1,200 Units by mouth daily. Take with food.    . linaclotide (LINZESS) 145 MCG CAPS capsule Take 1 capsule (145 mcg total) by mouth daily before breakfast. 4 capsule 0  . meloxicam (MOBIC) 15 MG tablet     . MINIVELLE 0.05 MG/24HR patch     . omeprazole (PRILOSEC) 20 MG capsule Take 1 capsule (20 mg total) by mouth daily. 90 capsule 3  . tolterodine (DETROL LA) 2 MG 24 hr capsule Take 1 capsule (2 mg total) by mouth daily. 90 capsule 3  . cefdinir (OMNICEF) 300 MG capsule Take 1 capsule (300 mg total) by mouth 2 (two) times daily. (Patient not taking: Reported on 08/11/2017) 20 capsule 0  . HYDROcodone-homatropine (HYCODAN) 5-1.5 MG/5ML syrup Take 5 mLs by mouth every 6 (six) hours as needed for up to 10 days for cough. 60 mL 0  . levofloxacin (LEVAQUIN) 500 MG tablet Take 1 tablet (500 mg total) by mouth daily. 7 tablet 0   No current facility-administered medications for this visit.     Allergies: Nefazodone and Trazodone and nefazodone  Past Medical History:  Diagnosis Date  . ALLERGIC RHINITIS   . [name]   . ANXIETY   . ASTHMA   . COMMON MIGRAINE   . DEPRESSION   . DIVERTICULOSIS, COLON   . GERD (gastroesophageal reflux disease) 09/26/2015  . HYPERLIPIDEMIA   . [name]   . Mild mitral regurgitation by prior echocardiogram     Past Surgical History:  Procedure Laterality Date  . ABDOMINAL HYSTERECTOMY    . APPENDECTOMY      Family History  Problem Relation Age of Onset  . Heart disease Father   . Heart disease Brother   . Colon  cancer Neg Hx     Social History   Tobacco Use  . Smoking status: Former Games developer  . Smokeless tobacco: Never Used  Substance Use Topics  . Alcohol use: No    Comment: occasional     Subjective:  Patient presents with recurrent cough/ congestion; was seen at the end of February and treated with 10 days of antibiotics; felt like she was doing better but symptoms started to return in the past 3 days; currently taking OTC Allegra 180 mg daily; no fever with this particular episode; feels like she has coughing fits/ uncontrollable coughing; does not respond to inhalers; prefers not to take oral steroids unless "absolutely necessary." Also mentions concerns regarding new onset pain over her right rib in the past few days- worried she may have broken a rib.   Objective:  Vitals:   08/11/17 1538  BP: 122/78  Pulse: 87  Temp: 98.4 F (36.9 C)  TempSrc: Oral  Weight: 145 lb 1.3 oz (65.8 kg)  Height: 5\' 1"  (1.549 m)    General: Well developed, well nourished, in no acute distress  Skin : Warm and dry.  Head: Normocephalic and atraumatic  Eyes: Sclera and conjunctiva clear; pupils round and reactive to light; extraocular movements intact  Ears: External normal; canals clear; tympanic membranes normal  Oropharynx: Pink, supple. No suspicious lesions  Neck: Supple without thyromegaly, adenopathy  Lungs: Respirations unlabored; clear to auscultation bilaterally without wheeze, rales, rhonchi  CVS exam: normal rate and regular rhythm.  Neurologic: Alert and oriented; speech intact; face symmetrical; moves all extremities well; CNII-XII intact without focal deficit  Assessment:  1. Cough   2. Acute bronchitis     Plan:  Update CXR and right rib X-ray today; Rx for Levaquin 500 mg qd x 7 days; sample of Symbicort 160 given in office- 2 puffs bid x 7-10 days; Rx for Hycodan cough syrup given- she has been given this by her PCP in the past with success; increase fluids, rest and follow-up worse,  no better; if symptoms persist, will need oral steroids.   No Follow-up on file.  Orders Placed This Encounter  Procedures  . DG Chest 2 View    Standing Status:   Future    Number of Occurrences:   1    Standing Expiration Date:   10/12/2018    Order Specific Question:   Reason for Exam (SYMPTOM  OR DIAGNOSIS REQUIRED)    Answer:   cough    Order Specific Question:   Preferred imaging location?    Answer:   [facility]    Order Specific Question:   Radiology Contrast Protocol - do NOT remove file path    Answer:   \\[email or web address]  . DG Ribs Unilateral Right    Standing Status:   Future    Number of Occurrences:   1    Standing  Expiration Date:   10/12/2018    Order Specific Question:   Reason for Exam (SYMPTOM  OR DIAGNOSIS REQUIRED)    Answer:   pain/ coughing    Order Specific Question:   Preferred [facility]?    [facility]    Order Specific Question:   Radiology Contrast Protocol - do NOT remove file path    Answer:   \\[email or web address]    Requested Prescriptions   Signed Prescriptions Disp Refills  . levofloxacin (LEVAQUIN) 500 MG tablet 7 tablet 0    Sig: Take 1 tablet (500 mg total) by mouth daily.  Marland Kitchen. HYDROcodone-homatropine (HYCODAN) 5-1.5 MG/5ML syrup 60 mL 0    Sig: Take 5 mLs by mouth every 6 (six) hours as needed for up to 10 days for cough.

## 2017-08-14 ENCOUNTER — Telehealth: Payer: Self-pay | Admitting: Family

## 2017-08-14 NOTE — Telephone Encounter (Signed)
Pt  Returned  [facility]  Call  For   X ray  Results  CRM  [id number]  Verified   No Result  Note in P[facility] result pool   Pt  Advised  Of  Xray  Results   Pt advised  That Ria ClockLaura  Murray  Suspects  She  Pulled a  Muscle  From  Coughing    Pt advised   She  Should  That  She  Should  Contact the  Office  If  She  Sees  No  Improvement after  Second  Round  Of anti biotics

## 2017-08-20 ENCOUNTER — Ambulatory Visit: Payer: Medicare Other | Admitting: Internal Medicine

## 2017-09-24 LAB — HM MAMMOGRAPHY

## 2017-10-01 ENCOUNTER — Other Ambulatory Visit: Payer: Self-pay | Admitting: Internal Medicine

## 2017-10-01 NOTE — Telephone Encounter (Signed)
Done erx 

## 2017-10-03 ENCOUNTER — Telehealth: Payer: Self-pay | Admitting: Internal Medicine

## 2017-10-03 MED ORDER — BUTALBITAL-APAP-CAFFEINE 50-325-40 MG PO TABS: ORAL_TABLET | ORAL | 0 refills | Status: DC

## 2017-10-03 NOTE — Telephone Encounter (Signed)
Done erx 

## 2017-10-03 NOTE — Telephone Encounter (Signed)
Copied from CRM [id number]. Topic: Quick Communication - Rx Refill/Question >> Oct 03, 2017 12:13 PM Raquel Sarna wrote: butalbital-acetaminophen-caffeine (FIORICET, ESGIC) [phone] MG tablet  Needing refills - pt is going out of town and needs this today.  [facility] - [place], [place] - [address] [place] [address] Phone: [phone] Fax: [phone]

## 2017-10-09 ENCOUNTER — Other Ambulatory Visit: Payer: Self-pay | Admitting: Internal Medicine

## 2017-10-09 ENCOUNTER — Ambulatory Visit: Payer: Medicare Other | Admitting: Internal Medicine

## 2017-10-09 ENCOUNTER — Other Ambulatory Visit (INDEPENDENT_AMBULATORY_CARE_PROVIDER_SITE_OTHER): Payer: Medicare Other

## 2017-10-09 ENCOUNTER — Encounter: Payer: Self-pay | Admitting: Internal Medicine

## 2017-10-09 VITALS — BP 120/78 | HR 84 | Temp 98.0°F | Ht 61.0 in | Wt 144.0 lb

## 2017-10-09 DIAGNOSIS — R739 Hyperglycemia, unspecified: Secondary | ICD-10-CM

## 2017-10-09 DIAGNOSIS — R05 Cough: Secondary | ICD-10-CM

## 2017-10-09 DIAGNOSIS — J45909 Unspecified asthma, uncomplicated: Secondary | ICD-10-CM

## 2017-10-09 DIAGNOSIS — Z23 Encounter for immunization: Secondary | ICD-10-CM | POA: Diagnosis not present

## 2017-10-09 DIAGNOSIS — I10 Essential (primary) hypertension: Secondary | ICD-10-CM

## 2017-10-09 DIAGNOSIS — R059 Cough, unspecified: Secondary | ICD-10-CM

## 2017-10-09 DIAGNOSIS — Z0001 Encounter for general adult medical examination with abnormal findings: Secondary | ICD-10-CM

## 2017-10-09 LAB — URINALYSIS, ROUTINE W REFLEX MICROSCOPIC
BILIRUBIN URINE: NEGATIVE
Hgb urine dipstick: NEGATIVE
Ketones, ur: NEGATIVE
Leukocytes, UA: NEGATIVE
Nitrite: NEGATIVE
PH: 6 (ref 5.0–8.0)
RBC / HPF: NONE SEEN (ref 0–?)
SPECIFIC GRAVITY, URINE: 1.01 (ref 1.000–1.030)
TOTAL PROTEIN, URINE-UPE24: NEGATIVE
URINE GLUCOSE: NEGATIVE
UROBILINOGEN UA: 0.2 (ref 0.0–1.0)

## 2017-10-09 LAB — CBC WITH DIFFERENTIAL/PLATELET
BASOS PCT: 1.1 % (ref 0.0–3.0)
Basophils Absolute: 0.1 10*3/uL (ref 0.0–0.1)
EOS PCT: 4.2 % (ref 0.0–5.0)
Eosinophils Absolute: 0.3 10*3/uL (ref 0.0–0.7)
HCT: 39.6 % (ref 36.0–46.0)
HEMOGLOBIN: 13.6 g/dL (ref 12.0–15.0)
Lymphocytes Relative: 34.7 % (ref 12.0–46.0)
Lymphs Abs: 2.6 10*3/uL (ref 0.7–4.0)
MCHC: 34.4 g/dL (ref 30.0–36.0)
MCV: 84.8 fl (ref 78.0–100.0)
Monocytes Absolute: 0.6 10*3/uL (ref 0.1–1.0)
Monocytes Relative: 8.6 % (ref 3.0–12.0)
NEUTROS ABS: 3.9 10*3/uL (ref 1.4–7.7)
Neutrophils Relative %: 51.4 % (ref 43.0–77.0)
PLATELETS: 344 10*3/uL (ref 150.0–400.0)
RBC: 4.67 Mil/uL (ref 3.87–5.11)
RDW: 13.7 % (ref 11.5–15.5)
WBC: 7.5 10*3/uL (ref 4.0–10.5)

## 2017-10-09 LAB — HEPATIC FUNCTION PANEL
ALT: 17 U/L (ref 0–35)
AST: 16 U/L (ref 0–37)
Albumin: 4.2 g/dL (ref 3.5–5.2)
Alkaline Phosphatase: 73 U/L (ref 39–117)
BILIRUBIN TOTAL: 0.4 mg/dL (ref 0.2–1.2)
Bilirubin, Direct: 0.1 mg/dL (ref 0.0–0.3)
TOTAL PROTEIN: 7.2 g/dL (ref 6.0–8.3)

## 2017-10-09 LAB — BASIC METABOLIC PANEL
BUN: 5 mg/dL — AB (ref 6–23)
CHLORIDE: 105 meq/L (ref 96–112)
CO2: 28 mEq/L (ref 19–32)
Calcium: 9 mg/dL (ref 8.4–10.5)
Creatinine, Ser: 0.77 mg/dL (ref 0.40–1.20)
GFR: 79.69 mL/min (ref >60.00)
GLUCOSE: 99 mg/dL (ref 70–99)
POTASSIUM: 3.4 meq/L — AB (ref 3.5–5.1)
SODIUM: 141 meq/L (ref 135–145)

## 2017-10-09 LAB — LIPID PANEL
CHOLESTEROL: 165 mg/dL (ref 0–200)
HDL: 63.2 mg/dL (ref >39.00)
LDL CALC: 74 mg/dL (ref 0–99)
NonHDL: 101.47
Total CHOL/HDL Ratio: 3
Triglycerides: 135 mg/dL (ref 0.0–149.0)
VLDL: 27 mg/dL (ref 0.0–40.0)

## 2017-10-09 LAB — TSH: TSH: 2.8 u[IU]/mL (ref 0.35–4.50)

## 2017-10-09 LAB — HEMOGLOBIN A1C: Hgb A1c MFr Bld: 5.9 % (ref 4.6–6.5)

## 2017-10-09 MED ORDER — AZITHROMYCIN 250 MG PO TABS: ORAL_TABLET | ORAL | 1 refills | Status: DC

## 2017-10-09 MED ORDER — HYDROCODONE-HOMATROPINE 5-1.5 MG/5ML PO SYRP: 5.0000 mL | ORAL_SOLUTION | Freq: Four times a day (QID) | ORAL | 0 refills | Status: AC | PRN

## 2017-10-09 NOTE — Patient Instructions (Addendum)
You had the Prevnar pneumonia shot today  Please take all new medication as prescribed - the antibiotic, and cough medicine  Please continue all other medications as before, and refills have been done if requested.  Please have the pharmacy call with any other refills you may need.  Please continue your efforts at being more active, low cholesterol diet, and weight control.  You are otherwise up to date with prevention measures today.  Please keep your appointments with your specialists as you may have planned  Please go to the LAB in the Basement (turn left off the elevator) for the tests to be done today  You will be contacted by phone if any changes need to be made immediately.  Otherwise, you will receive a letter about your results with an explanation, but please check with MyChart first.  Please remember to sign up for MyChart if you have not done so, as this will be important to you in the future with finding out test results, communicating by private email, and scheduling acute appointments online when needed.  Please return in 1 year for your yearly visit, or sooner if needed, with Lab testing done 3-5 days before

## 2017-10-09 NOTE — Progress Notes (Signed)
Subjective:    [name], female    DOB: 04-13-1951, 67 y.o.   MRN: [id number]  HPI  Here for wellness and f/u;  Overall doing ok;  Pt denies Chest pain, worsening SOB, DOE, wheezing, orthopnea, PND, worsening LE edema, palpitations, dizziness or syncope.  Pt denies neurological change such as new headache, facial or extremity weakness.  Pt denies polydipsia, polyuria, or low sugar symptoms. Pt states overall good compliance with treatment and medications, good tolerability, and has been trying to follow appropriate diet.  Pt denies worsening depressive symptoms, suicidal ideation or panic. No fever, night sweats, wt loss, loss of appetite, or other constitutional symptoms.  Pt states good ability with ADL's, has low fall risk, home safety reviewed and adequate, no other significant changes in hearing or vision, and only occasionally active with exercise. Also, Here with acute onset mild to mod 2-3 days ST, HA, general weakness and malaise, with prod cough greenish sputum, Does have several wks ongoing nasal allergy symptoms with clearish congestion, itch and sneezing, without fever, pain, ST, cough, swelling or wheezing, but Allegra helping.   Past Medical History:  Diagnosis Date  . ALLERGIC RHINITIS   . [name]   . ANXIETY   . ASTHMA   . COMMON MIGRAINE   . DEPRESSION   . DIVERTICULOSIS, COLON   . GERD (gastroesophageal reflux disease) 09/26/2015  . HYPERLIPIDEMIA   . [name]   . Mild mitral regurgitation by prior echocardiogram    Past Surgical History:  Procedure Laterality Date  . ABDOMINAL HYSTERECTOMY    . APPENDECTOMY      reports that she has quit smoking. She has never used smokeless tobacco. She reports that she does not drink alcohol or use drugs. family history includes Heart disease in her brother and father. Allergies  Allergen Reactions  . Nefazodone   . Trazodone And Nefazodone    Current Outpatient Medications on  File Prior to Visit  Medication Sig Dispense Refill  . amitriptyline (ELAVIL) 50 MG tablet Take 1 tablet (50 mg total) by mouth at bedtime as needed. Yearly physical due in May must make appt for future refills 1 tablet 0  . aspirin EC 81 MG tablet Take 1 tablet (81 mg total) by mouth daily. 90 tablet 11  . benzonatate (TESSALON) 100 MG capsule Take 1 capsule (100 mg total) by mouth 3 (three) times daily as needed. 20 capsule 0  . butalbital-acetaminophen-caffeine (FIORICET, ESGIC) 50-325-40 MG tablet TAKE 1 TABLET TWICE DAILY AS NEEDED FOR HEADACHE. 60 tablet 0  . Calcium Carbonate-Vit D-Min (CALCIUM 1200 PO) Take 1 capsule by mouth daily.    . cefdinir (OMNICEF) 300 MG capsule Take 1 capsule (300 mg total) by mouth 2 (two) times daily. 20 capsule 0  . diclofenac sodium (VOLTAREN) 1 % GEL Apply 4 g topically 4 (four) times daily as needed. 400 g 11  . estradiol (VIVELLE-DOT) 0.05 MG/24HR patch estradiol 0.05 mg/24 hr semiweekly transdermal patch  Apply patch twice weekly    . FABIOR 0.1 % FOAM apply TO FACE EVERY DAY AT BEDTIME over moisturizer  5  . Investigational vitamin D 600 UNITS capsule SWOG S0812 Take 1,200 Units by mouth daily. Take with food.    Marland Kitchen levofloxacin (LEVAQUIN) 500 MG tablet Take 1 tablet (500 mg total) by mouth daily. 7 tablet 0  . linaclotide (LINZESS) 145 MCG CAPS capsule Take 1 capsule (145 mcg total) by mouth daily before breakfast. 4 capsule 0  .  meloxicam (MOBIC) 15 MG tablet     . MINIVELLE 0.05 MG/24HR patch     . omeprazole (PRILOSEC) 20 MG capsule Take 1 capsule (20 mg total) by mouth daily. 90 capsule 3  . tolterodine (DETROL LA) 2 MG 24 hr capsule Take 1 capsule (2 mg total) by mouth daily. 90 capsule 3   No current facility-administered medications on file prior to visit.    Review of Systems Constitutional: Negative for other unusual diaphoresis, sweats, appetite or weight changes HENT: Negative for other worsening hearing loss, ear pain, facial swelling,  mouth sores or neck stiffness.   Eyes: Negative for other worsening pain, redness or other visual disturbance.  Respiratory: Negative for other stridor or swelling Cardiovascular: Negative for other palpitations or other chest pain  Gastrointestinal: Negative for worsening diarrhea or loose stools, blood in stool, distention or other pain Genitourinary: Negative for hematuria, flank pain or other change in urine volume.  Musculoskeletal: Negative for myalgias or other joint swelling.  Skin: Negative for other color change, or other wound or worsening drainage.  Neurological: Negative for other syncope or numbness. Hematological: Negative for other adenopathy or swelling Psychiatric/Behavioral: Negative for hallucinations, other worsening agitation, SI, self-injury, or new decreased concentration All other system neg per pt    Objective:   Physical Exam BP 120/78   Pulse 84   Temp 98 F (36.7 C) (Oral)   Ht  (1.549 m)   Wt 144 lb (65.3 kg)   SpO2 96%   BMI 27.21 kg/m  VS noted, mild ill Constitutional: Pt is oriented to person, place, and time. Appears well-developed and well-nourished, in no significant distress and comfortable Head: Normocephalic and atraumatic  Bilat tm's with mild erythema.  Max sinus areas mild tender.  Pharynx with mild erythema, no exudate Eyes: Conjunctivae and EOM are normal. Pupils are equal, round, and reactive to light Right Ear: External ear normal without discharge Left Ear: External ear normal without discharge Nose: Nose without discharge or deformity Mouth/Throat: Oropharynx is without other ulcerations and moist  Neck: Normal range of motion. Neck supple. No JVD present. No tracheal deviation present or significant neck LA or mass Cardiovascular: Normal rate, regular rhythm, normal heart sounds and intact distal pulses.   Pulmonary/Chest: WOB normal and breath sounds without rales or wheezing  Abdominal: Soft. Bowel sounds are normal. NT. No  HSM  Musculoskeletal: Normal range of motion. Exhibits no edema Lymphadenopathy: Has no other cervical adenopathy.  Neurological: Pt is alert and oriented to person, place, and time. Pt has normal reflexes. No cranial nerve deficit. Motor grossly intact, Gait intact Skin: Skin is warm and dry. No rash noted or new ulcerations Psychiatric:  Has normal mood and affect. Behavior is normal without agitation No other exam findings    Assessment & Plan:

## 2017-10-10 ENCOUNTER — Telehealth: Payer: Self-pay | Admitting: Internal Medicine

## 2017-10-10 NOTE — Telephone Encounter (Signed)
Copied from CRM [id number]. Topic: Quick Communication - See Telephone Encounter >> Oct 10, 2017 10:39 AM Raquel Sarna wrote: Efraim Kaufmann appeals dept w/  [facility]  [phone]  ext [phone]  Has questions on pt's Fiorinal w/ codeine 30 mg. They will also fax over the questions.

## 2017-10-10 NOTE — Telephone Encounter (Signed)
Called Melissa, LVM with my direct line number.

## 2017-10-11 ENCOUNTER — Encounter: Payer: Self-pay | Admitting: Internal Medicine

## 2017-10-11 NOTE — Assessment & Plan Note (Addendum)
Mild to mod, cw/ bronchitis vs pna, declines cxr, for antibx course,  to f/u any worsening symptoms or concerns  In addition to the time spent performing CPE, I spent an additional 15 minutes face to face,in which greater than 50% of this time was spent in counseling and coordination of care for patient's illness as documented, including the differential dx, treatment, further evaluation and other management of cough with bronchitis, hyperglycemia, HTN, asthma

## 2017-10-11 NOTE — Assessment & Plan Note (Signed)

## 2017-10-11 NOTE — Assessment & Plan Note (Signed)
stable overall by history and exam, recent data reviewed with pt, and pt to continue medical treatment as before,  to f/u any worsening symptoms or concerns BP Readings from Last 3 Encounters:  10/09/17 120/78  08/11/17 122/78  07/23/17 116/84

## 2017-10-11 NOTE — Assessment & Plan Note (Signed)
stable overall by history and exam, recent data reviewed with pt, and pt to continue medical treatment as before,  to f/u any worsening symptoms or concerns Lab Results  Component Value Date   HGBA1C 5.9 10/09/2017

## 2017-10-11 NOTE — Assessment & Plan Note (Signed)
stable overall by history and exam, and pt to continue medical treatment as before,  to f/u any worsening symptoms or concerns 

## 2017-10-31 ENCOUNTER — Ambulatory Visit: Payer: Medicare Other | Admitting: Internal Medicine

## 2017-10-31 ENCOUNTER — Encounter: Payer: Self-pay | Admitting: Internal Medicine

## 2017-10-31 VITALS — BP 118/72 | HR 87 | Temp 98.9°F | Ht 61.0 in | Wt 141.0 lb

## 2017-10-31 DIAGNOSIS — J45909 Unspecified asthma, uncomplicated: Secondary | ICD-10-CM | POA: Diagnosis not present

## 2017-10-31 DIAGNOSIS — J069 Acute upper respiratory infection, unspecified: Secondary | ICD-10-CM

## 2017-10-31 DIAGNOSIS — F411 Generalized anxiety disorder: Secondary | ICD-10-CM

## 2017-10-31 DIAGNOSIS — J309 Allergic rhinitis, unspecified: Secondary | ICD-10-CM

## 2017-10-31 MED ORDER — HYDROCODONE-HOMATROPINE 5-1.5 MG/5ML PO SYRP: 5.0000 mL | ORAL_SOLUTION | Freq: Four times a day (QID) | ORAL | 0 refills | Status: DC | PRN

## 2017-10-31 MED ORDER — AZITHROMYCIN 250 MG PO TABS: ORAL_TABLET | ORAL | 1 refills | Status: DC

## 2017-10-31 NOTE — Patient Instructions (Addendum)
Please take all new medication as prescribed - the antibiotic, and cough medicine if needed  You can also take OTC Nasacort for allergies, and Mucinex (or it's generic off brand) for congestion, and tylenol as needed for pain.  Please continue all other medications as before, and refills have been done if requested.  Please have the pharmacy call with any other refills you may need.  Please keep your appointments with your specialists as you may have planned

## 2017-10-31 NOTE — Progress Notes (Signed)
Subjective:    [name], female    DOB: 1950-11-22, 67 y.o.   MRN: [id number]  HPI   Here with 2-3 days acute onset fever, facial pain, pressure, headache, general weakness and malaise, and greenish d/c, with mild ST and cough, but pt denies chest pain, wheezing, increased sob or doe, orthopnea, PND, increased LE swelling, palpitations, dizziness or syncope.  Does have several wks ongoing nasal allergy symptoms with clearish congestion, itch and sneezing, without fever, pain, ST, cough, swelling or wheezing.  Denies worsening depressive symptoms, suicidal ideation, or panic; has ongoing anxiety.  No other new complaints or interval hx Past Medical History:  Diagnosis Date  . ALLERGIC RHINITIS   . [name]   . ANXIETY   . ASTHMA   . COMMON MIGRAINE   . DEPRESSION   . DIVERTICULOSIS, COLON   . GERD (gastroesophageal reflux disease) 09/26/2015  . HYPERLIPIDEMIA   . [name]   . Mild mitral regurgitation by prior echocardiogram    Past Surgical History:  Procedure Laterality Date  . ABDOMINAL HYSTERECTOMY    . APPENDECTOMY      reports that she has quit smoking. She has never used smokeless tobacco. She reports that she does not drink alcohol or use drugs. family history includes Heart disease in her brother and father. Allergies  Allergen Reactions  . Nefazodone   . Trazodone And Nefazodone    Current Outpatient Medications on File Prior to Visit  Medication Sig Dispense Refill  . amitriptyline (ELAVIL) 50 MG tablet TAKE 1 TABLET AT BEDTIME AS NEEDED FOR SLEEP. 90 tablet 3  . aspirin EC 81 MG tablet Take 1 tablet (81 mg total) by mouth daily. 90 tablet 11  . atorvastatin (LIPITOR) 10 MG tablet TAKE 1 TABLET ONCE DAILY. 90 tablet 3  . butalbital-acetaminophen-caffeine (FIORICET, ESGIC) 50-325-40 MG tablet TAKE 1 TABLET TWICE DAILY AS NEEDED FOR HEADACHE. 60 tablet 0  . Calcium Carbonate-Vit D-Min (CALCIUM 1200 PO) Take 1 capsule by  mouth daily.    . cefdinir (OMNICEF) 300 MG capsule Take 1 capsule (300 mg total) by mouth 2 (two) times daily. 20 capsule 0  . diclofenac sodium (VOLTAREN) 1 % GEL Apply 4 g topically 4 (four) times daily as needed. 400 g 11  . estradiol (VIVELLE-DOT) 0.05 MG/24HR patch estradiol 0.05 mg/24 hr semiweekly transdermal patch  Apply patch twice weekly    . FABIOR 0.1 % FOAM apply TO FACE EVERY DAY AT BEDTIME over moisturizer  5  . Investigational vitamin D 600 UNITS capsule SWOG S0812 Take 1,200 Units by mouth daily. Take with food.    Marland Kitchen levofloxacin (LEVAQUIN) 500 MG tablet Take 1 tablet (500 mg total) by mouth daily. 7 tablet 0  . linaclotide (LINZESS) 145 MCG CAPS capsule Take 1 capsule (145 mcg total) by mouth daily before breakfast. 4 capsule 0  . meloxicam (MOBIC) 15 MG tablet     . MINIVELLE 0.05 MG/24HR patch     . omeprazole (PRILOSEC) 20 MG capsule Take 1 capsule (20 mg total) by mouth daily. 90 capsule 3  . tolterodine (DETROL LA) 2 MG 24 hr capsule Take 1 capsule (2 mg total) by mouth daily. 90 capsule 3  . benzonatate (TESSALON) 100 MG capsule Take 1 capsule (100 mg total) by mouth 3 (three) times daily as needed. (Patient not taking: Reported on 10/31/2017) 20 capsule 0   No current facility-administered medications on file prior to visit.    Review of Systems  Constitutional: Negative for other unusual diaphoresis or sweats HENT: Negative for ear discharge or swelling Eyes: Negative for other worsening visual disturbances Respiratory: Negative for stridor or other swelling  Gastrointestinal: Negative for worsening distension or other blood Genitourinary: Negative for retention or other urinary change Musculoskeletal: Negative for other MSK pain or swelling Skin: Negative for color change or other new lesions Neurological: Negative for worsening tremors and other numbness  Psychiatric/Behavioral: Negative for worsening agitation or other fatigue All other system neg per pt      Objective:   Physical Exam BP 118/72 (BP Location: Left Arm, Patient Position: Sitting, Cuff Size: Normal)   Pulse 87   Temp 98.9 F (37.2 C) (Oral)   Ht 5\' 1"  (1.549 m)   Wt 141 lb (64 kg)   SpO2 95%   BMI 26.64 kg/m  VS noted, mild ill Constitutional: Pt appears in NAD HENT: Head: NCAT.  Right Ear: External ear normal.  Left Ear: External ear normal.  Eyes: . Pupils are equal, round, and reactive to light. Conjunctivae and EOM are normal Bilat tm's with mild erythema.  Max sinus areas mild tender.  Pharynx with mild erythema, no exudate Nose: without d/c or deformity Neck: Neck supple. Gross normal ROM Cardiovascular: Normal rate and regular rhythm.   Pulmonary/Chest: Effort normal and breath sounds without rales or wheezing.  Neurological: Pt is alert. At baseline orientation, motor grossly intact Skin: Skin is warm. No rashes, other new lesions, no LE edema Psychiatric: Pt behavior is normal without agitation , mild nervous No other exam findings       Assessment & Plan:

## 2017-11-02 NOTE — Assessment & Plan Note (Signed)
Mild to mod, for nasacort asd,  to f/u any worsening symptoms or concerns 

## 2017-11-02 NOTE — Assessment & Plan Note (Signed)
Overall stable,. stable overall by history and exam, and pt to continue medical treatment as before,  to f/u any worsening symptoms or concerns

## 2017-11-02 NOTE — Assessment & Plan Note (Signed)
Mild to mod, for antibx course,  to f/u any worsening symptoms or concerns 

## 2017-11-02 NOTE — Assessment & Plan Note (Signed)
stable overall by history and exam, and pt to continue medical treatment as before,  to f/u any worsening symptoms or concerns 

## 2017-11-10 ENCOUNTER — Encounter: Payer: Self-pay | Admitting: Internal Medicine

## 2017-11-10 ENCOUNTER — Ambulatory Visit: Payer: Medicare Other | Admitting: Internal Medicine

## 2017-11-10 VITALS — BP 112/76 | HR 89 | Temp 98.4°F | Ht 61.0 in | Wt 139.0 lb

## 2017-11-10 DIAGNOSIS — R05 Cough: Secondary | ICD-10-CM

## 2017-11-10 DIAGNOSIS — R0600 Dyspnea, unspecified: Secondary | ICD-10-CM

## 2017-11-10 DIAGNOSIS — R059 Cough, unspecified: Secondary | ICD-10-CM

## 2017-11-10 DIAGNOSIS — R0609 Other forms of dyspnea: Secondary | ICD-10-CM

## 2017-11-10 DIAGNOSIS — I10 Essential (primary) hypertension: Secondary | ICD-10-CM | POA: Diagnosis not present

## 2017-11-10 MED ORDER — BUDESONIDE-FORMOTEROL FUMARATE 160-4.5 MCG/ACT IN AERO: 2.0000 | INHALATION_SPRAY | Freq: Two times a day (BID) | RESPIRATORY_TRACT | 3 refills | Status: DC

## 2017-11-10 MED ORDER — ALBUTEROL SULFATE HFA 108 (90 BASE) MCG/ACT IN AERS: 1.0000 | INHALATION_SPRAY | Freq: Four times a day (QID) | RESPIRATORY_TRACT | 11 refills | Status: DC | PRN

## 2017-11-10 MED ORDER — HYDROCODONE-HOMATROPINE 5-1.5 MG/5ML PO SYRP: 5.0000 mL | ORAL_SOLUTION | Freq: Four times a day (QID) | ORAL | 0 refills | Status: AC | PRN

## 2017-11-10 NOTE — Assessment & Plan Note (Signed)
Etiology unclear, ok for cough med prn for symptoms relief for now, also restart symbicort, and add albuterol MDI prn, and refer pulmonary

## 2017-11-10 NOTE — Progress Notes (Signed)
Subjective:    [name], female    DOB: [birth year], 67 y.o.   MRN: [id number]  HPI  Here with chronic persistent scant prod cough for > 5 months assoc with mild intermitent sob, now with chest soreness to cough, just wont resolve with prior tx including antibx.Mar 2019 cxr neg for acute.  Had right rib pain negative as well.   Pt denies fever, wt loss, night sweats, loss of appetite, or other constitutional symptoms  Pt denies chest pain, wheezing, orthopnea, PND, increased LE swelling, palpitations, dizziness or syncope.  Has run out of symbicort, not sure how well it helped Past Medical History:  Diagnosis Date  . ALLERGIC RHINITIS   . [name]   . ANXIETY   . ASTHMA   . COMMON MIGRAINE   . DEPRESSION   . DIVERTICULOSIS, COLON   . GERD (gastroesophageal reflux disease) 09/26/2015  . HYPERLIPIDEMIA   . [name]   . Mild mitral regurgitation by prior echocardiogram    Past Surgical History:  Procedure Laterality Date  . ABDOMINAL HYSTERECTOMY    . APPENDECTOMY      reports that she has quit smoking. She has never used smokeless tobacco. She reports that she does not drink alcohol or use drugs. family history includes Heart disease in her brother and father. Allergies  Allergen Reactions  . Nefazodone   . Trazodone And Nefazodone    Current Outpatient Medications on File Prior to Visit  Medication Sig Dispense Refill  . amitriptyline (ELAVIL) 50 MG tablet TAKE 1 TABLET AT BEDTIME AS NEEDED FOR SLEEP. 90 tablet 3  . aspirin EC 81 MG tablet Take 1 tablet (81 mg total) by mouth daily. 90 tablet 11  . atorvastatin (LIPITOR) 10 MG tablet TAKE 1 TABLET ONCE DAILY. 90 tablet 3  . butalbital-acetaminophen-caffeine (FIORICET, ESGIC) 50-325-40 MG tablet TAKE 1 TABLET TWICE DAILY AS NEEDED FOR HEADACHE. 60 tablet 0  . Calcium Carbonate-Vit D-Min (CALCIUM 1200 PO) Take 1 capsule by mouth daily.    . diclofenac sodium (VOLTAREN) 1 % GEL  Apply 4 g topically 4 (four) times daily as needed. 400 g 11  . estradiol (VIVELLE-DOT) 0.05 MG/24HR patch estradiol 0.05 mg/24 hr semiweekly transdermal patch  Apply patch twice weekly    . FABIOR 0.1 % FOAM apply TO FACE EVERY DAY AT BEDTIME over moisturizer  5  . Investigational vitamin D 600 UNITS capsule SWOG S0812 Take 1,200 Units by mouth daily. Take with food.    . linaclotide (LINZESS) 145 MCG CAPS capsule Take 1 capsule (145 mcg total) by mouth daily before breakfast. 4 capsule 0  . meloxicam (MOBIC) 15 MG tablet     . MINIVELLE 0.05 MG/24HR patch     . omeprazole (PRILOSEC) 20 MG capsule Take 1 capsule (20 mg total) by mouth daily. 90 capsule 3  . tolterodine (DETROL LA) 2 MG 24 hr capsule Take 1 capsule (2 mg total) by mouth daily. 90 capsule 3   No current facility-administered medications on file prior to visit.    ROS:   Constitutional: Negative for other unusual diaphoresis or sweats HENT: Negative for ear discharge or swelling Eyes: Negative for other worsening visual disturbances Respiratory: Negative for stridor or other swelling  Gastrointestinal: Negative for worsening distension or other blood Genitourinary: Negative for retention or other urinary change Musculoskeletal: Negative for other MSK pain or swelling Skin: Negative for color change or other new lesions Neurological: Negative for worsening tremors and other numbness  Psychiatric/Behavioral: Negative for worsening agitation or other fatigue All other system neg per pt    Objective:   Physical Exam BP 112/76   Pulse 89   Temp 98.4 F (36.9 C) (Oral)   Ht 5\' 1"  (1.549 m)   Wt 139 lb (63 kg)   SpO2 97%   BMI 26.26 kg/m  VS noted, non toxic Constitutional: Pt appears in NAD HENT: Head: NCAT.  Right Ear: External ear normal.  Left Ear: External ear normal.  Eyes: . Pupils are equal, round, and reactive to light. Conjunctivae and EOM are normal Nose: without d/c or deformity Neck: Neck supple. Gross  normal ROM Cardiovascular: Normal rate and regular rhythm.   Pulmonary/Chest: Effort normal and breath sounds mild decreased without rales or wheezing.  Abd:  Soft, NT, ND, + BS, no organomegaly Neurological: Pt is alert. At baseline orientation, motor grossly intact Skin: Skin is warm. No rashes, other new lesions, no LE edema Psychiatric: Pt behavior is normal without agitation  No other exam findings    Assessment & Plan:

## 2017-11-10 NOTE — Assessment & Plan Note (Signed)
stable overall by history and exam, recent data reviewed with pt, and pt to continue medical treatment as before,  to f/u any worsening symptoms or concerns BP Readings from Last 3 Encounters:  11/10/17 112/76  10/31/17 118/72  10/09/17 120/78

## 2017-11-10 NOTE — Assessment & Plan Note (Signed)
For inhalers as above, ? Asthma vs other

## 2017-11-10 NOTE — Patient Instructions (Signed)
Please take all new medication as prescribed - the albuterol and symbicort inhalers  Please continue all other medications as before, and refills have been done if requested. - the cough medicine  Please have the pharmacy call with any other refills you may need.  Please keep your appointments with your specialists as you may have planned  You will be contacted regarding the referral for: Pulmonary

## 2017-11-11 ENCOUNTER — Encounter: Payer: Self-pay | Admitting: Internal Medicine

## 2017-11-11 ENCOUNTER — Ambulatory Visit: Payer: Medicare Other | Admitting: Internal Medicine

## 2017-11-11 VITALS — BP 114/64 | HR 82 | Ht 61.0 in | Wt 141.0 lb

## 2017-11-11 DIAGNOSIS — J45991 Cough variant asthma: Secondary | ICD-10-CM

## 2017-11-11 DIAGNOSIS — R0609 Other forms of dyspnea: Secondary | ICD-10-CM | POA: Diagnosis not present

## 2017-11-11 LAB — NITRIC OXIDE: NITRIC OXIDE: 10

## 2017-11-11 MED ORDER — PREDNISONE 10 MG PO TABS: ORAL_TABLET | ORAL | 0 refills | Status: DC

## 2017-11-11 MED ORDER — PANTOPRAZOLE SODIUM 40 MG PO TBEC
40.0000 mg | DELAYED_RELEASE_TABLET | Freq: Every day | ORAL | 2 refills | Status: DC
Start: 2017-11-11 — End: 2019-08-05

## 2017-11-11 MED ORDER — FAMOTIDINE 20 MG PO TABS: ORAL_TABLET | ORAL | 11 refills | Status: DC

## 2017-11-11 NOTE — Progress Notes (Signed)
Subjective:     [name], female   DOB: May 27, 1951,    MRN: [id number]  HPI  62 yowf  quit smoking 1994 with tendency to bad coughs with each cold requiring ov's since around 2000 when developed gen ant cp with cough dx as pleurisy and bronchitis - cp sometimes worse on R, sometime L always anterior- new episode early  march 2019 with nl cxr > some better p rx then worse again April 2019 rx zpak x two not much better so referred to pulmonary clinic 11/11/2017 by Dr  Jonny Ruiz   11/11/2017 1st [facility] office visit/ Takashi Korol   Chief Complaint  Patient presents with  . Pulmonary Consult    Referred by Dr. Oliver Barre.  Pt c/o cough and SOB off and on since March 2019. Her cough is non prod. She states she feels some pressure in her chest. She gets SOB walking her dog and "when I do just about anything".    tried symbicort no better [facility] = can't walk a nl pace on a flat grade s sob but does fine slow and flat     Chest heaviness with exertion better at rest since March 2019 Cough wakes her up about 4 h p hs and cough med     Kouffman Reflux v Neurogenic Cough Differentiator Reflux Comments  Do you awaken from a sound sleep coughing violently?                            With trouble breathing? Yes  no   Do you have choking episodes when you cannot  Get enough air, gasping for air ?              occ    Do you usually cough when you lie down into  The bed, or when you just lie down to rest ?                          No attributed to cough med or sporadic   Do you usually cough after meals or eating?         no   Do you cough when (or after) you bend over?    no   GERD SCORE     Kouffman Reflux v Neurogenic Cough Differentiator Neurogenic   Do you more-or-less cough all day long? Sporadic    Does change of temperature make you cough? no   Does laughing or chuckling cause you to cough? sometimes   Do fumes (perfume, automobile fumes, burned  Toast, etc.,) cause you to cough  ?      no   Does speaking, singing, or talking on the phone cause you to cough   ?               Sometimes    Neurogenic/Airway score        No obvious other patterns in  day to day or daytime variability or assoc excess/ purulent sputum or mucus plugs or hemoptysis or cp or chest tightness, subjective wheeze or overt sinus or hb symptoms. No unusual exposure hx or h/o childhood pna/ asthma or knowledge of premature birth.    Also denies any obvious fluctuation of symptoms with weather or environmental changes or other aggravating or alleviating factors except as outlined above   Current Allergies, Complete Past Medical History, Past Surgical History, Family History, and Social  History were reviewed in [facility] electronic medical record.  ROS  The following are not active complaints unless bolded Hoarseness, sore throat, dysphagia, dental problems, itching, sneezing,  nasal congestion or discharge of excess mucus or purulent secretions, ear ache,   fever, chills, sweats, unintended wt loss or wt gain, classically pleuritic or exertional cp,  orthopnea pnd or arm/hand swelling  or leg swelling, presyncope, palpitations, abdominal pain, anorexia, nausea, vomiting, diarrhea  or change in bowel habits or change in bladder habits, change in stools or change in urine, dysuria, hematuria,  rash, arthralgias, visual complaints, headache, numbness, weakness or ataxia or problems with walking or coordination,  change in mood or  memory.        Current Meds  Medication Sig  . amitriptyline (ELAVIL) 50 MG tablet TAKE 1 TABLET AT BEDTIME AS NEEDED FOR SLEEP.  Marland Kitchen. aspirin EC 81 MG tablet Take 1 tablet (81 mg total) by mouth daily.  Marland Kitchen. atorvastatin (LIPITOR) 10 MG tablet TAKE 1 TABLET ONCE DAILY.  . butalbital-acetaminophen-caffeine (FIORICET, ESGIC) 50-325-40 MG tablet TAKE 1 TABLET TWICE DAILY AS NEEDED FOR HEADACHE.  . Calcium Carbonate-Vit D-Min (CALCIUM 1200 PO) Take 1 capsule by mouth daily.  .  diclofenac sodium (VOLTAREN) 1 % GEL Apply 4 g topically 4 (four) times daily as needed.  Marland Kitchen. estradiol (VIVELLE-DOT) 0.05 MG/24HR patch estradiol 0.05 mg/24 hr semiweekly transdermal patch  Apply patch twice weekly  . FABIOR 0.1 % FOAM apply TO FACE EVERY DAY AT BEDTIME over moisturizer  . HYDROcodone-homatropine (HYCODAN) 5-1.5 MG/5ML syrup Take 5 mLs by mouth every 6 (six) hours as needed for up to 10 days for cough.  . linaclotide (LINZESS) 145 MCG CAPS capsule Take 1 capsule (145 mcg total) by mouth daily before breakfast.  . meloxicam (MOBIC) 15 MG tablet   .  ] omeprazole (PRILOSEC) 20 MG capsule Take 1 capsule (20 mg total) by mouth daily. (Patient taking differently: Take 20 mg by mouth as needed. )       Review of Systems     Objective:   Physical Exam    amb wf nad   Wt Readings from Last 3 Encounters:  11/11/17 141 lb (64 kg)  11/10/17 139 lb (63 kg)  10/31/17 141 lb (64 kg)     Vital signs reviewed - Note on arrival 02 sats  96% on RA      HEENT: nl dentition, turbinates bilaterally, and oropharynx. Nl external ear canals without cough reflex   NECK :  without JVD/Nodes/TM/ nl carotid upstrokes bilaterally   LUNGS: no acc muscle use,  Nl contour chest which is clear to A and P bilaterally without cough on insp or exp maneuvers   CV:  RRR  no s3 or murmur or increase in P2, and no edema   ABD:  Obese/ soft and nontender with nl inspiratory excursion in the supine position. No bruits or organomegaly appreciated, bowel sounds nl  MS:  Nl gait/ ext warm without deformities, calf tenderness, cyanosis or clubbing No obvious joint restrictions   SKIN: warm and dry without lesions    NEURO:  alert, approp, nl sensorium with  no motor or cerebellar deficits apparent.    ekg 11/11/2017 p walk to point of chest heavy > no specific ST or T wave changes to suggest ischemia     I personally reviewed images and agree with radiology impression as follows:  CXR:    08/21/17 There is no pneumonia nor other acute cardiopulmonary abnormality.  Labs ordered 11/11/2017   Allergy profile, bnp > did not go to lab    Labs  reviewed:      Chemistry      Component Value Date/Time   NA 141 10/09/2017 1614   K 3.4 (L) 10/09/2017 1614   CL 105 10/09/2017 1614   CO2 28 10/09/2017 1614   BUN 5 (L) 10/09/2017 1614   CREATININE 0.77 10/09/2017 1614      Component Value Date/Time   CALCIUM 9.0 10/09/2017 1614   ALKPHOS 73 10/09/2017 1614   AST 16 10/09/2017 1614   ALT 17 10/09/2017 1614   BILITOT 0.4 10/09/2017 1614        Lab Results  Component Value Date   WBC 7.5 10/09/2017   HGB 13.6 10/09/2017   HCT 39.6 10/09/2017   MCV 84.8 10/09/2017   PLT 344.0 10/09/2017       EOS                                                               0.3                                    11/11/2017     Lab Results  Component Value Date   TSH 2.80 10/09/2017         Assessment:

## 2017-11-11 NOTE — Patient Instructions (Addendum)
Prednisone 10 mg take  4 each am x 2 days,   2 each am x 2 days,  1 each am x 2 days and stop   Pantoprazole (protonix) 40 mg   Take  30-60 min before first meal of the day and Pepcid (famotidine)  20 mg one @  bedtime until return to office - this is the best way to tell whether stomach acid is contributing to your problem.     GERD (REFLUX)  is an extremely common cause of respiratory symptoms just like yours , many times with no obvious heartburn at all.    It can be treated with medication, but also with lifestyle changes including elevation of the head of your bed (ideally with 6 inch  bed blocks),  Smoking cessation, avoidance of late meals, excessive alcohol, and avoid fatty foods, chocolate, peppermint, colas, red wine, and acidic juices such as orange juice.  NO MINT OR MENTHOL PRODUCTS SO NO COUGH DROPS   USE SUGARLESS CANDY INSTEAD (Jolley ranchers or Stover's or Life Savers) or even ice chips will also do - the key is to swallow to prevent all throat clearing. NO OIL BASED VITAMINS - use powdered substitutes.   For drainage / throat tickle try take CHLORPHENIRAMINE  4 mg - take one every 4 hours as needed - available over the counter- may cause drowsiness so start with just a bedtime dose or two and see how you tolerate it before trying in daytime    Try to stop the cough medication after 5 days.   Please schedule a follow up office visit in 4 weeks, sooner if needed  - did not go to lab as req

## 2017-11-12 ENCOUNTER — Encounter: Payer: Self-pay | Admitting: Internal Medicine

## 2017-11-12 NOTE — Assessment & Plan Note (Addendum)
11/11/2017  Walked RA x 3 laps @ 185 ft each stopped due to  End of study, nl pace, no sob or desat / some chest tightness   And no ischemic  changes on ekg immediately p ex     Reported chest heaviness suggestive of AP but she's pretty sure it's been the same pattern for years with last neg myoview reivewed = nl in 10/12/ 2015 but may need to be repeated to be sure - certainly this is not likely asthma related chest tightness but could all be explained by gerd so will start there and regroup in 4 weeks.   Total time devoted to counseling  > 50 % of initial 60 min office visit:  review case with pt/ discussion of options/alternatives/ personally creating written customized instructions  in presence of pt  then going over those specific  Instructions directly with the pt including how to use all of the meds but in particular covering each new medication in detail and the difference between the maintenance= "automatic" meds and the prns using an action plan format for the latter (If this problem/symptom => do that organization reading Left to right).  Please see AVS from this visit for a full list of these instructions which I personally wrote for this pt and  are unique to this visit.

## 2017-11-12 NOTE — Assessment & Plan Note (Addendum)
PFT's 09/21/14 s obstruction  FENO 11/11/2017  =   10  - 11/11/2017 reported no better p symbicort   Unexplained persistent sporadic and noct cough/ recurrent x years assoc with atypical cp and no response to rx for asthma is probably not asthma but rather Upper airway cough syndrome (previously labeled PNDS),  is so named because it's frequently impossible to sort out how much is  CR/sinusitis with freq throat clearing (which can be related to primary GERD)   vs  causing  secondary (" extra esophageal")  GERD from wide swings in gastric pressure that occur with throat clearing, often  promoting self use of mint and menthol lozenges that reduce the lower esophageal sphincter tone and exacerbate the problem further in a cyclical fashion.   These are the same pts (now being labeled as having "irritable larynx syndrome" by some cough centers) who not infrequently have a history of having failed to tolerate ace inhibitors,  dry powder inhalers or biphosphonates or report having atypical/extraesophageal reflux symptoms that don't respond to standard doses of PPI  and are easily confused as having aecopd or asthma flares by even experienced allergists/ pulmonologists (myself included).    Of the three most common causes of  Sub-acute / recurrent or chronic cough, only one (GERD)  can actually contribute to/ trigger  the other two (asthma and post nasal drip syndrome)  and perpetuate the cylce of cough.  While not intuitively obvious, many patients with chronic low grade reflux do not cough until there is a primary insult that disturbs the protective epithelial barrier and exposes sensitive nerve endings.   This is typically viral but can due to PNDS and  either may apply here.      The point is that once this occurs, it is difficult to eliminate the cycle  using anything but a maximally effective acid suppression regimen at least in the short run, accompanied by an appropriate diet to address non acid GERD and/  eliminate pnds with 1st gen H1 blockers per guidelines / add 6 days of prednisone to cover any inflammatory/allergic component  And finally  control / eliminate the cough itself for at least 3-5 days with narcotic cough meds but then they should be stopped at that point    Will see pt back in 4 weeks to regroup with BNP / allergy profile in meantime.   Discussed:  The standardized cough guidelines published in Chest by [place] in 2006 are still the best available and consist of a multiple step process (up to 12!) , not a single office visit,  and are intended  to address this problem logically,  with an alogrithm dependent on response to empiric treatment at  each progressive step  to determine a specific diagnosis with  minimal addtional testing needed. Therefore if adherence is an issue or can't be accurately verified,  it's very unlikely the standard evaluation and treatment will be successful here.    Furthermore, response to therapy (other than acute cough suppression, which should only be used short term with avoidance of narcotic containing cough syrups if possible), can be a gradual process for which the patient is not likely to  perceive immediate benefit.  Unlike going to an eye doctor where the best perscription is almost always the first one and is immediately effective, this is almost never the case in the management of chronic cough syndromes. Therefore the patient needs to commit up front to consistently adhere to recommendations  for up  to 6 weeks of therapy directed at the likely underlying problem(s) before the response can be reasonably evaluated.    Total time devoted to counseling  > 50 % of initial 60 min office visit:  review case with pt/ discussion of options/alternatives/ personally creating written customized instructions  in presence of pt  then going over those specific  Instructions directly with the pt including how to use all of the meds but in particular covering  each new medication in detail and the difference between the maintenance= "automatic" meds and the prns using an action plan format for the latter (If this problem/symptom => do that organization reading Left to right).  Please see AVS from this visit for a full list of these instructions which I personally wrote for this pt and  are unique to this visit.

## 2017-12-09 ENCOUNTER — Ambulatory Visit: Payer: Medicare Other | Admitting: Internal Medicine

## 2017-12-26 ENCOUNTER — Encounter: Payer: Self-pay | Admitting: Internal Medicine

## 2017-12-26 ENCOUNTER — Telehealth: Payer: Self-pay | Admitting: Internal Medicine

## 2017-12-26 ENCOUNTER — Ambulatory Visit: Payer: Medicare Other | Admitting: Internal Medicine

## 2017-12-26 ENCOUNTER — Ambulatory Visit (INDEPENDENT_AMBULATORY_CARE_PROVIDER_SITE_OTHER)
Admission: RE | Admit: 2017-12-26 | Discharge: 2017-12-26 | Disposition: A | Discharge status: Home / Self Care | Payer: Medicare Other | Source: Ambulatory Visit | Attending: Internal Medicine | Admitting: Internal Medicine

## 2017-12-26 VITALS — BP 110/70 | HR 73 | Temp 98.0°F | Ht 61.0 in | Wt 140.0 lb

## 2017-12-26 DIAGNOSIS — M545 Low back pain, unspecified: Secondary | ICD-10-CM

## 2017-12-26 DIAGNOSIS — M542 Cervicalgia: Secondary | ICD-10-CM | POA: Insufficient documentation

## 2017-12-26 DIAGNOSIS — F411 Generalized anxiety disorder: Secondary | ICD-10-CM

## 2017-12-26 DIAGNOSIS — G43009 Migraine without aura, not intractable, without status migrainosus: Secondary | ICD-10-CM | POA: Diagnosis not present

## 2017-12-26 MED ORDER — BUTALBITAL-APAP-CAFFEINE 50-325-40 MG PO TABS: ORAL_TABLET | ORAL | 0 refills | Status: DC

## 2017-12-26 MED ORDER — TIZANIDINE HCL 2 MG PO TABS: 2.0000 mg | ORAL_TABLET | Freq: Four times a day (QID) | ORAL | 1 refills | Status: DC | PRN

## 2017-12-26 NOTE — Assessment & Plan Note (Signed)
Pt reassured, exam o/w benign

## 2017-12-26 NOTE — Assessment & Plan Note (Addendum)
C/w msk strain, ok for tizanidine 2 mg prn,  to f/u any worsening symptoms or concerns

## 2017-12-26 NOTE — Patient Instructions (Signed)
Please take all new medication as prescribed - the muscle relaxer as needed  Please continue all other medications as before, and refills have been done if requested - the fioricet  Please have the pharmacy call with any other refills you may need.  Please keep your appointments with your specialists as you may have planned  Please go to the XRAY Department in the Basement (go straight as you get off the elevator) for the x-ray testing  You will be contacted by phone if any changes need to be made immediately.  Otherwise, you will receive a letter about your results with an explanation, but please check with MyChart first.  Please remember to sign up for MyChart if you have not done so, as this will be important to you in the future with finding out test results, communicating by private email, and scheduling acute appointments online when needed.
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
## 2017-12-26 NOTE — Telephone Encounter (Signed)
Copied from CRM #[id number]. Topic: Quick Communication - See Telephone Encounter >> Dec 26, 2017  1:32 PM Mickel BaasMcGee, Valrie Jia B, NT wrote: CRM for notification. See Telephone encounter for: 12/26/17. Patient calling and states that the pharmacy is sending over something to Dr Jonny Ruizjohn to fill out/sign so the patient can get half of her butalbital-acetaminophen-caffeine (FIORICET, ESGIC) 50-325-40 MG tablet prescription that she had already paid for.  CB#: [phone] [facility] [place] [address]

## 2017-12-26 NOTE — Assessment & Plan Note (Signed)
Ok for med refill,  to f/u any worsening symptoms or concerns  

## 2017-12-26 NOTE — Assessment & Plan Note (Signed)
C/w msk strain, ok for tizanidine 2 mg prn,  to f/u any worsening symptoms or concerns 

## 2017-12-26 NOTE — Telephone Encounter (Signed)
FYI

## 2017-12-30 ENCOUNTER — Telehealth: Payer: Self-pay | Admitting: Emergency Medicine

## 2017-12-30 NOTE — Telephone Encounter (Signed)
Lyn Hollingsheadlexander, [facility] 12/30/2017 10:01 AM  Summary: checking on status    Pt calling in to check on the status of this: Has form been received by office? When will she be able to pick up remainder of medication (already paid for)? Pt can be reached at [phone]

## 2017-12-30 NOTE — Telephone Encounter (Signed)
Called patient to schedule AWV. Pt declined at this time. 

## 2017-12-30 NOTE — Telephone Encounter (Signed)
butalbital-acetaminophen-caffeine (FIORICET, ESGIC) 50-325-40 MG PA initiated via CoverMyMeds.   Key: [id number] - PA Case ID: [id number]  Pt ID: [id number]

## 2017-12-31 NOTE — Telephone Encounter (Signed)
Approved through 05/26/18   

## 2018-01-12 MED ORDER — BUTALBITAL-APAP-CAFFEINE 50-325-40 MG PO TABS: ORAL_TABLET | ORAL | 0 refills | Status: DC

## 2018-01-12 NOTE — Addendum Note (Signed)
Addended by: Roney MansGAY, SHIRRON on: 01/12/2018 10:40 AM   Modules accepted: Orders

## 2018-01-12 NOTE — Telephone Encounter (Signed)
Done erx 

## 2018-01-12 NOTE — Addendum Note (Signed)
Addended by: Corwin LevinsJOHN, Irish Breisch W on: 01/12/2018 10:42 AM   Modules accepted: Orders

## 2018-01-12 NOTE — Telephone Encounter (Signed)
Received a PA request for this medication again today. [facility] informed them that medication was approved on 12/31/17 and a PA was initiated on 12/30/17. Heather, pharmacist, stated the also needed a new script sent in.

## 2018-02-19 ENCOUNTER — Encounter: Payer: Self-pay | Admitting: Physician Assistant

## 2018-03-02 ENCOUNTER — Other Ambulatory Visit: Payer: Self-pay | Admitting: *Deleted

## 2018-03-02 ENCOUNTER — Encounter: Payer: Self-pay | Admitting: Physician Assistant

## 2018-03-02 ENCOUNTER — Ambulatory Visit: Payer: Medicare Other | Admitting: Physician Assistant

## 2018-03-02 ENCOUNTER — Other Ambulatory Visit (INDEPENDENT_AMBULATORY_CARE_PROVIDER_SITE_OTHER): Payer: Medicare Other

## 2018-03-02 ENCOUNTER — Telehealth: Payer: Self-pay | Admitting: *Deleted

## 2018-03-02 VITALS — BP 84/60 | HR 88 | Ht 60.25 in | Wt 136.5 lb

## 2018-03-02 DIAGNOSIS — K5792 Diverticulitis of intestine, part unspecified, without perforation or abscess without bleeding: Secondary | ICD-10-CM

## 2018-03-02 DIAGNOSIS — K5909 Other constipation: Secondary | ICD-10-CM

## 2018-03-02 DIAGNOSIS — R1030 Lower abdominal pain, unspecified: Secondary | ICD-10-CM

## 2018-03-02 LAB — CBC WITH DIFFERENTIAL/PLATELET
Basophils Absolute: 0.1 10*3/uL (ref 0.0–0.1)
Basophils Relative: 0.8 % (ref 0.0–3.0)
EOS PCT: 3.5 % (ref 0.0–5.0)
Eosinophils Absolute: 0.3 10*3/uL (ref 0.0–0.7)
HCT: 39.2 % (ref 36.0–46.0)
HEMOGLOBIN: 13.3 g/dL (ref 12.0–15.0)
LYMPHS PCT: 35.7 % (ref 12.0–46.0)
Lymphs Abs: 2.6 10*3/uL (ref 0.7–4.0)
MCHC: 33.8 g/dL (ref 30.0–36.0)
MCV: 85.2 fl (ref 78.0–100.0)
Monocytes Absolute: 0.5 10*3/uL (ref 0.1–1.0)
Monocytes Relative: 6.9 % (ref 3.0–12.0)
Neutro Abs: 3.8 10*3/uL (ref 1.4–7.7)
Neutrophils Relative %: 53.1 % (ref 43.0–77.0)
Platelets: 354 10*3/uL (ref 150.0–400.0)
RBC: 4.6 Mil/uL (ref 3.87–5.11)
RDW: 13.9 % (ref 11.5–15.5)
WBC: 7.3 10*3/uL (ref 4.0–10.5)

## 2018-03-02 LAB — COMPREHENSIVE METABOLIC PANEL
ALBUMIN: 4.2 g/dL (ref 3.5–5.2)
ALK PHOS: 80 U/L (ref 39–117)
ALT: 16 U/L (ref 0–35)
AST: 17 U/L (ref 0–37)
BUN: 16 mg/dL (ref 6–23)
CALCIUM: 9.3 mg/dL (ref 8.4–10.5)
CO2: 30 mEq/L (ref 19–32)
Chloride: 106 mEq/L (ref 96–112)
Creatinine, Ser: 0.95 mg/dL (ref 0.40–1.20)
GFR: 62.46 mL/min (ref >60.00)
Glucose, Bld: 91 mg/dL (ref 70–99)
POTASSIUM: 4.5 meq/L (ref 3.5–5.1)
Sodium: 141 mEq/L (ref 135–145)
TOTAL PROTEIN: 7.3 g/dL (ref 6.0–8.3)
Total Bilirubin: 0.4 mg/dL (ref 0.2–1.2)

## 2018-03-02 MED ORDER — LINACLOTIDE 145 MCG PO CAPS: ORAL_CAPSULE | ORAL | 4 refills | Status: DC

## 2018-03-02 MED ORDER — AMOXICILLIN-POT CLAVULANATE 875-125 MG PO TABS: ORAL_TABLET | ORAL | 0 refills | Status: DC

## 2018-03-02 NOTE — Progress Notes (Signed)
Reviewed and agree with management plan.  Mykael Batz T. Jahden Schara, MD FACG 

## 2018-03-02 NOTE — Patient Instructions (Signed)
Please go to the basement level to have your labs drawn.  We sent prescriptions to [facility] 1. Augmentin 875 mg.  2. Linzess 145 mcg , samples provided.   High Fiber Diet handout provided.  Drink lots of water daily, 60 0z.   We will call you with a cancellation for the CT scan sooner than Friday if possible.  You have been scheduled for a CT scan of the abdomen and pelvis at [facility] ([address]---this is in the same building as Press [facility]).   You are scheduled on Friday 03-06-2018 at 10:00 am. You should arrive at 9:45 am to your appointment time for registration. Please follow the written instructions below on the day of your exam:  WARNING: IF YOU ARE ALLERGIC TO IODINE/X-RAY DYE, PLEASE NOTIFY RADIOLOGY IMMEDIATELY AT [phone]! YOU WILL BE GIVEN A 13 HOUR PREMEDICATION PREP.  1) Do not eat anything after 6:00 am (4 hours prior to your test) 2) You have been given 2 bottles of oral contrast to drink. The solution may taste better if refrigerated, but do NOT add ice or any other liquid to this solution. Shake well before drinking.    Drink 1 bottle of contrast @ 8:00 am (2 hours prior to your exam)  Drink 1 bottle of contrast @ 9:00 am (1 hour prior to your exam)  You may take any medications as prescribed with a small amount of water, if necessary. If you take any of the following medications: METFORMIN, GLUCOPHAGE, GLUCOVANCE, AVANDAMET, RIOMET, FORTAMET, [place] MET, JANUMET, GLUMETZA or METAGLIP, you MAY be asked to HOLD this medication 48 hours AFTER the exam.  The purpose of you drinking the oral contrast is to aid in the visualization of your intestinal tract. The contrast solution may cause some diarrhea. Depending on your individual set of symptoms, you may also receive an intravenous injection of x-ray contrast/dye. Plan on being at [facility] for 30 minutes or longer, depending on the type of exam you are having  performed.  This test typically takes 30-45 minutes to complete.  If you have any questions regarding your exam or if you need to reschedule, you may call the CT department at [phone] between the hours of 8:00 am and 5:00 pm, Monday-Friday.  ________________________________________________________________________Normal BMI (Body Mass Index- based on height and weight) is between 23 and 30. Your BMI today is Body mass index is 26.44 kg/m. Marland Kitchen Please consider follow up  regarding your BMI with your Primary Care Provider.

## 2018-03-02 NOTE — Progress Notes (Signed)
Subjective:    [name], female    DOB: [birth year], 67 y.o.   MRN: [id number]  HPI [name] is a pleasant 67 year old white female, known to [name] who was last seen in our office in 2014 at which time she underwent colonoscopy with finding of mild sigmoid diverticulosis and otherwise negative exam. He has history of chronic constipation, GERD, migraines, hypertension and anxiety/depression. She comes in today after 2 recent episodes of lower abdominal pain associated with fever.  She says the first episode occurred at the beginning of September and lasted for about 4 days and then resolved.  She says she felt bad in general had bilateral lower quadrant abdominal pain which was fairly constant during those 4 days.  She did not develop any significant diarrhea, constipation or any melena or hematochezia.  Her appetite was decreased. She had another similar episode about 2 weeks later which again lasted about 4 days and then improved but has not completely resolved.  She had temp to 103 max 1 of these episodes.  She denies any urinary symptoms. At this time she is able to eat without any difficulty, complains of chronic problems with constipation and may go 4 to 5 days without a bowel movement.  She would like something to help with constipation. Not had any prior episodes of diverticulitis.  Review of Systems Pertinent positive and negative review of systems were noted in the above HPI section.  All other review of systems was otherwise negative.  Outpatient Encounter Medications as of 03/02/2018  Medication Sig  . amitriptyline (ELAVIL) 50 MG tablet TAKE 1 TABLET AT BEDTIME AS NEEDED FOR SLEEP.  Marland Kitchen aspirin EC 81 MG tablet Take 1 tablet (81 mg total) by mouth daily.  Marland Kitchen atorvastatin (LIPITOR) 10 MG tablet TAKE 1 TABLET ONCE DAILY.  . butalbital-acetaminophen-caffeine (FIORICET, ESGIC) 50-325-40 MG tablet TAKE 1 TABLET TWICE DAILY AS NEEDED FOR HEADACHE.  . Calcium Carbonate-Vit  D-Min (CALCIUM 1200 PO) Take 1 capsule by mouth daily.  . diclofenac sodium (VOLTAREN) 1 % GEL Apply 4 g topically 4 (four) times daily as needed.  Marland Kitchen estradiol (VIVELLE-DOT) 0.05 MG/24HR patch estradiol 0.05 mg/24 hr semiweekly transdermal patch  Apply patch twice weekly  . FABIOR 0.1 % FOAM apply TO FACE EVERY DAY AT BEDTIME over moisturizer  . famotidine (PEPCID) 20 MG tablet One at bedtime  . linaclotide (LINZESS) 145 MCG CAPS capsule Take 1 capsule by mouth daily.  . pantoprazole (PROTONIX) 40 MG tablet Take 1 tablet (40 mg total) by mouth daily. Take 30-60 min before first meal of the day (Patient taking differently: Take 40 mg by mouth as needed. Take 30-60 min before first meal of the day)  . tiZANidine (ZANAFLEX) 2 MG tablet Take 1 tablet (2 mg total) by mouth every 6 (six) hours as needed for muscle spasms.  . [DISCONTINUED] linaclotide (LINZESS) 145 MCG CAPS capsule Take 1 capsule (145 mcg total) by mouth daily before breakfast.  . amoxicillin-clavulanate (AUGMENTIN) 875-125 MG tablet Take 1 tablet by mouth twice daily for 10 days.  . meloxicam (MOBIC) 15 MG tablet Take 15 mg by mouth daily.   . [DISCONTINUED] predniSONE (DELTASONE) 10 MG tablet Take  4 each am x 2 days,   2 each am x 2 days,  1 each am x 2 days and stop   No facility-administered encounter medications on file as of 03/02/2018.    Allergies  Allergen Reactions  . Nefazodone   . Trazodone And Nefazodone  Patient Active Problem List   Diagnosis Date Noted  . Neck pain 12/26/2017  . DOE (dyspnea on exertion) 11/10/2017  . Acute upper respiratory infection 10/31/2017  . Rib pain on left side 10/02/2016  . Hyperglycemia 08/09/2016  . GERD (gastroesophageal reflux disease) 09/26/2015  . Abnormal chest x-ray 09/06/2014  . Diverticulitis of colon without hemorrhage 04/20/2014  . Wheezing 04/20/2014  . Cough 04/06/2014  . Pain in the chest 02/24/2014  . Right wrist tendinitis 12/14/2013  . CMC arthritis, thumb,  degenerative 12/14/2013  . Paresthesia 03/01/2013  . Other malaise and fatigue 03/01/2013  . Pain of right thumb 02/04/2013  . Palpitations 01/26/2013  . Lower gastrointestinal bleed 07/28/2012  . Lower abdominal pain 06/25/2012  . Low back pain 07/29/2011  . Encounter for preventative adult health care exam with abnormal findings 07/26/2011  . Cough variant asthma vs UACS 07/19/2008  . DYSPNEA 06/07/2008  . [name] 11/17/2007  . Anxiety state 11/17/2007  . Essential hypertension 11/17/2007  . DIVERTICULOSIS, COLON 11/17/2007  . [name] 11/17/2007  . Hyperlipidemia 01/26/2007  . DEPRESSION 01/24/2007  . Migraine without aura 01/24/2007  . Allergic rhinitis 01/24/2007   Social History   Socioeconomic History  . Marital status: Single    Spouse name: Not on file  . Number of children: 3  . Years of education: Not on file  . Highest education level: Not on file  Occupational History  . Occupation: retired  Scientific laboratory technician  . Financial resource strain: Not on file  . Food insecurity:    Worry: Not on file    Inability: Not on file  . Transportation needs:    Medical: Not on file    Non-medical: Not on file  Tobacco Use  . Smoking status: Former Smoker    Packs/day: 1.00    Years: 20.00    Pack years: 20.00    Types: Cigarettes    Last attempt to quit: 05/27/1992    Years since quitting: 25.7  . Smokeless tobacco: Never Used  Substance and Sexual Activity  . Alcohol use: No    Comment: occasional -1 a month  . Drug use: No  . Sexual activity: Not on file  Lifestyle  . Physical activity:    Days per week: Not on file    Minutes per session: Not on file  . Stress: Not on file  Relationships  . Social connections:    Talks on phone: Not on file    Gets together: Not on file    Attends religious service: Not on file    Active member of club or organization: Not on file    Attends meetings of clubs or organizations: Not on file     Relationship status: Not on file  . Intimate partner violence:    Fear of current or ex partner: Not on file    Emotionally abused: Not on file    Physically abused: Not on file    Forced sexual activity: Not on file  Other Topics Concern  . Not on file  Social History Narrative   Occasional  Caffeine drinker     Ms. Salada's family history includes Bone cancer in her maternal aunt; Breast cancer in her maternal aunt; Cancer in her maternal uncle; Heart disease in her brother, father, mother, and paternal grandfather; Leukemia in her maternal grandmother; Other in her son.      Objective:    Vitals:   03/02/18 1005  BP: (!) 84/60  Pulse: 88  Physical Exam; well-developed older white female in no acute distress, does not blood pressure 84/60 pulse 88, height 5 foot, weight 136, BMI 26.4.  HEENT; nontraumatic normocephalic EOMI PERRLA sclera anicteric oral mucosa moist, Cardiovascular; regular rate and rhythm with S1-S2 no murmur rub or gallop, Pulmonary; clear bilaterally Abdomen; soft, she is tender in the left lower quadrant and suprapubic region, there is no guarding or rebound there is some fullness in the left lower quadrant no definite mass no palpable hepatosplenomegaly, bowel sounds present.  Rectal ;exam not done, Extremities ;no clubbing cyanosis or edema skin warm and dry, Neuro psych ;alert and oriented, grossly nonfocal mood and affect appropriate       Assessment & Dorsey:   #16 67 year old white female with 2 recent episodes of lower abdominal pain and intermittent fever each lasting about 4 days.  The second episode was about 2 weeks ago.  She is feeling better at this time but still having some mild lower abdominal discomfort.  I suspect she has had diverticulitis, she is definitely still tender on exam, and with reported significant fevers will rule out abscess  #2 chronic constipation #3 GERD stable uses pantoprazole and famotidine as needed #4 colon cancer  screening-up-to-date with negative colonoscopy 2014  Dorsey; CBC with differential, C met Start Augmentin 875 mg p.o. twice daily x10 days to be taken with food Scheduled for CT of the abdomen and pelvis with contrast Patient had been given samples of Linzess in the past but never took it.  She would like to try this.  We gave her samples again and have sent a prescription for 145 mcg p.o. daily. Further recommendations pending results of above.  Amy S Esterwood PA-C 03/02/2018   Cc: Biagio Borg, MD

## 2018-03-02 NOTE — Telephone Encounter (Signed)
LM for the pharmacist at [facility] to please cancel the 2 prescriptions we sent there today. The patient decided she wanted to send the prescriptions to [facility]. [address]

## 2018-03-06 ENCOUNTER — Inpatient Hospital Stay: Admission: RE | Admit: 2018-03-06 | Payer: Medicare Other | Source: Ambulatory Visit

## 2018-03-09 ENCOUNTER — Telehealth: Payer: Self-pay | Admitting: Physician Assistant

## 2018-03-09 NOTE — Telephone Encounter (Signed)
Patient states she can not do her CT tomorrow 11.15.19 @1 :30pm as scheduled and wants to know if she can change it to Wednesday 11.16.19. Patient states okay to LVM.

## 2018-03-09 NOTE — Telephone Encounter (Signed)
Patient contacted and instructed. CT moved to 03/20/18 at 2:30 pm.  No openings 03/11/18

## 2018-03-10 ENCOUNTER — Inpatient Hospital Stay: Admission: RE | Admit: 2018-03-10 | Payer: Medicare Other | Source: Ambulatory Visit

## 2018-03-20 ENCOUNTER — Telehealth: Payer: Self-pay | Admitting: Physician Assistant

## 2018-03-20 ENCOUNTER — Ambulatory Visit (INDEPENDENT_AMBULATORY_CARE_PROVIDER_SITE_OTHER)
Admission: RE | Admit: 2018-03-20 | Discharge: 2018-03-20 | Disposition: A | Discharge status: Home / Self Care | Payer: Medicare Other | Source: Ambulatory Visit | Attending: Physician Assistant | Admitting: Physician Assistant

## 2018-03-20 DIAGNOSIS — K5792 Diverticulitis of intestine, part unspecified, without perforation or abscess without bleeding: Secondary | ICD-10-CM

## 2018-03-20 MED ORDER — IOPAMIDOL (ISOVUE-300) INJECTION 61%
100.0000 mL | Freq: Once | INTRAVENOUS | Status: AC | PRN
  Administered 2018-03-20: 100 mL via INTRAVENOUS

## 2018-03-20 NOTE — Telephone Encounter (Signed)
Reviewed her instructions. She will fast after 10:30 am today. May drink water. May brush her teeth. One bottle of contrast at 12:30 and 1:30. Arrive at 2:15 for a 2:30 pm appointment [place] CT today.

## 2018-03-20 NOTE — Telephone Encounter (Signed)
Patient is having her CT today and has lost her instructions. Patient has a few questions and would like to speak to the nurse.

## 2018-04-02 ENCOUNTER — Telehealth: Payer: Self-pay | Admitting: Internal Medicine

## 2018-04-02 MED ORDER — AMITRIPTYLINE HCL 100 MG PO TABS: 100.0000 mg | ORAL_TABLET | Freq: Every day | ORAL | 1 refills | Status: DC

## 2018-04-02 NOTE — Telephone Encounter (Signed)
Copied from CRM [id number]. Topic: Quick Communication - See Telephone Encounter >> Apr 02, 2018  9:46 AM Arlyss Gandy, NT wrote: CRM for notification. See Telephone encounter for: 04/02/18. Pt states that the amitriptyline (ELAVIL) 50 MG tablet is no longer helping her and wanting to see if this dosage can be increased and sent in to the pharmacy? [facility] - [place], [place] - [address] [phone] (Phone) [phone] (Fax)

## 2018-04-02 NOTE — Telephone Encounter (Signed)
Ok to let pt know we increased the elavil to 100 mg qhs prn, she can take 2 of the 50 mg tabs to use them up.  Your new rx was sent to [facility]

## 2018-04-02 NOTE — Telephone Encounter (Signed)
Ok for increased elavil to 100 qhs

## 2018-04-22 ENCOUNTER — Ambulatory Visit: Payer: Medicare Other | Admitting: Internal Medicine

## 2018-04-22 ENCOUNTER — Encounter: Payer: Self-pay | Admitting: Internal Medicine

## 2018-04-22 VITALS — BP 122/76 | HR 86 | Temp 97.6°F | Ht 60.25 in | Wt 142.0 lb

## 2018-04-22 DIAGNOSIS — R519 Headache, unspecified: Secondary | ICD-10-CM

## 2018-04-22 DIAGNOSIS — I10 Essential (primary) hypertension: Secondary | ICD-10-CM | POA: Diagnosis not present

## 2018-04-22 DIAGNOSIS — R739 Hyperglycemia, unspecified: Secondary | ICD-10-CM

## 2018-04-22 DIAGNOSIS — R51 Headache: Secondary | ICD-10-CM | POA: Diagnosis not present

## 2018-04-22 MED ORDER — BUTALBITAL-APAP-CAFFEINE 50-325-40 MG PO TABS: ORAL_TABLET | ORAL | 0 refills | Status: DC

## 2018-04-22 NOTE — Assessment & Plan Note (Signed)
stable overall by history and exam, recent data reviewed with pt, and pt to continue medical treatment as before,  to f/u any worsening symptoms or concerns  
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
## 2018-04-22 NOTE — Assessment & Plan Note (Signed)
Etiology unclear with recent worsening severity and frequency, declines topamax trial, for Head MRI, consider neurology for persistence

## 2018-04-22 NOTE — Patient Instructions (Addendum)
Please continue all other medications as before, and refills have been done if requested - the headache medication  Please have the pharmacy call with any other refills you may need.  Please continue your efforts at being more active, low cholesterol diet, and weight control.  Please keep your appointments with your specialists as you may have planned  You will be contacted regarding the referral for: Head MRI

## 2018-05-10 ENCOUNTER — Ambulatory Visit
Admission: RE | Admit: 2018-05-10 | Discharge: 2018-05-10 | Disposition: A | Discharge status: Home / Self Care | Payer: Medicare Other | Source: Ambulatory Visit | Attending: Internal Medicine | Admitting: Internal Medicine

## 2018-05-10 DIAGNOSIS — R51 Headache: Principal | ICD-10-CM

## 2018-05-10 DIAGNOSIS — R519 Headache, unspecified: Secondary | ICD-10-CM

## 2018-06-12 ENCOUNTER — Telehealth: Payer: Self-pay | Admitting: Internal Medicine

## 2018-06-12 NOTE — Telephone Encounter (Signed)
Copied from CRM [id number]. Topic: General - Other >> Jun 12, 2018 12:53 PM Tamela Oddi wrote: Reason for CRM: Patient called and requested to speak with the office manager regarding a letter that she received that said her charges had went to collection.  Patient stated that she never got a bill from her CT Scan and now it is in collections.  She also stated that there was another charge that was paid at the office when she was at her appointment.  Patient did not want to be transferred to billing and stated that she wanted to speak directly with a manager or someone with authority.  Please advise and call patient back at [phone]

## 2018-06-15 NOTE — Telephone Encounter (Signed)
Left patient vm to give me a call back in regard.

## 2018-07-06 ENCOUNTER — Other Ambulatory Visit: Payer: Self-pay | Admitting: Internal Medicine

## 2018-07-15 ENCOUNTER — Other Ambulatory Visit: Payer: Self-pay | Admitting: Internal Medicine

## 2018-07-15 NOTE — Telephone Encounter (Signed)
Done erx 

## 2018-07-29 ENCOUNTER — Ambulatory Visit: Payer: Medicare Other | Admitting: Internal Medicine

## 2018-07-31 ENCOUNTER — Ambulatory Visit: Payer: Medicare Other | Admitting: Internal Medicine

## 2018-08-04 ENCOUNTER — Encounter: Payer: Self-pay | Admitting: Internal Medicine

## 2018-08-04 ENCOUNTER — Ambulatory Visit: Payer: Medicare Other | Admitting: Internal Medicine

## 2018-08-04 VITALS — BP 124/76 | HR 93 | Temp 98.4°F | Ht 60.0 in | Wt 144.0 lb

## 2018-08-04 DIAGNOSIS — I10 Essential (primary) hypertension: Secondary | ICD-10-CM | POA: Diagnosis not present

## 2018-08-04 DIAGNOSIS — J069 Acute upper respiratory infection, unspecified: Secondary | ICD-10-CM | POA: Diagnosis not present

## 2018-08-04 DIAGNOSIS — M542 Cervicalgia: Secondary | ICD-10-CM | POA: Diagnosis not present

## 2018-08-04 DIAGNOSIS — G43009 Migraine without aura, not intractable, without status migrainosus: Secondary | ICD-10-CM | POA: Diagnosis not present

## 2018-08-04 MED ORDER — AZITHROMYCIN 250 MG PO TABS: ORAL_TABLET | ORAL | 1 refills | Status: DC

## 2018-08-04 MED ORDER — BUTALBITAL-APAP-CAFFEINE 50-325-40 MG PO TABS: ORAL_TABLET | ORAL | 0 refills | Status: DC

## 2018-08-04 MED ORDER — TIZANIDINE HCL 2 MG PO TABS: 2.0000 mg | ORAL_TABLET | Freq: Four times a day (QID) | ORAL | 1 refills | Status: DC | PRN

## 2018-08-04 NOTE — Assessment & Plan Note (Signed)
With worsening freq and severity, declines topamax for now, will refer neurology

## 2018-08-04 NOTE — Progress Notes (Signed)
Subjective:    [name], female    DOB: 05-Apr-1951, 68 y.o.   MRN: [id number]  HPI  Here with c/o HA recurrent variable locations such as frontal unilateral throbbing mild to severe, but also occasional crown and /or occipital as well, Pt denies new neurological symptoms such as new facial or extremity weakness or numbness.  Recent Head MRI neg for acute.  Also incidentally,  Here with 2-3 days acute onset fever, facial pain, pressure, headache, general weakness and malaise, and greenish d/c, with mild ST and cough, but pt denies chest pain, wheezing, increased sob or doe, orthopnea, PND, increased LE swelling, palpitations, dizziness or syncope.  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 ALLERGIC RHINITIS   . [name]   . ANXIETY   . Arthritis   . ASTHMA   . Chronic headaches   . COMMON MIGRAINE   . DEPRESSION   . Diabetes (HCC)   . DIVERTICULOSIS, COLON   . GERD (gastroesophageal reflux disease) 09/26/2015  . HYPERLIPIDEMIA   . [name]   . Mild mitral regurgitation by prior echocardiogram   . Pneumonia    Past Surgical History:  Procedure Laterality Date  . ABDOMINAL HYSTERECTOMY    . APPENDECTOMY    . CHOLECYSTECTOMY      reports that she quit smoking about 26 years ago. Her smoking use included cigarettes. She has a 20.00 pack-year smoking history. She has never used smokeless tobacco. She reports that she does not drink alcohol or use drugs. family history includes Bone cancer in her maternal aunt; Breast cancer in her maternal aunt; Cancer in her maternal uncle; Heart disease in her brother, father, mother, and paternal grandfather; Leukemia in her maternal grandmother; Other in her son. Allergies  Allergen Reactions  . Nefazodone   . Trazodone And Nefazodone    Current Outpatient Medications on File Prior to Visit  Medication Sig  Dispense Refill  . amitriptyline (ELAVIL) 100 MG tablet Take 1 tablet (100 mg total) by mouth at bedtime. 90 tablet 1  . aspirin EC 81 MG tablet Take 1 tablet (81 mg total) by mouth daily. 90 tablet 11  . atorvastatin (LIPITOR) 10 MG tablet TAKE 1 TABLET ONCE DAILY. 90 tablet 3  . Calcium Carbonate-Vit D-Min (CALCIUM 1200 PO) Take 1 capsule by mouth daily.    . diclofenac sodium (VOLTAREN) 1 % GEL Apply 4 g topically 4 (four) times daily as needed. 400 g 11  . estradiol (VIVELLE-DOT) 0.05 MG/24HR patch estradiol 0.05 mg/24 hr semiweekly transdermal patch  Apply patch twice weekly    . FABIOR 0.1 % FOAM apply TO FACE EVERY DAY AT BEDTIME over moisturizer  5  . famotidine (PEPCID) 20 MG tablet One at bedtime 30 tablet 11  . linaclotide (LINZESS) 145 MCG CAPS capsule Take 1 capsule by mouth daily. 30 capsule 4  . meloxicam (MOBIC) 15 MG tablet Take 15 mg by mouth daily.     Marland Kitchen omeprazole (PRILOSEC) 20 MG capsule TAKE (1) CAPSULE DAILY. 90 capsule 0  . pantoprazole (PROTONIX) 40 MG tablet Take 1 tablet (40 mg total) by mouth daily. Take 30-60 min before first meal of the day (Patient taking differently: Take 40 mg by mouth as needed. Take 30-60 min before first meal of the day) 30 tablet 2   No current facility-administered medications on file prior to visit.  Review of Systems  Constitutional: Negative for other unusual diaphoresis or sweats HENT: Negative for ear discharge or swelling Eyes: Negative for other worsening visual disturbances Respiratory: Negative for stridor or other swelling  Gastrointestinal: Negative for worsening distension or other blood Genitourinary: Negative for retention or other urinary change Musculoskeletal: Negative for other MSK pain or swelling Skin: Negative for color change or other new lesions Neurological: Negative for worsening tremors and other numbness  Psychiatric/Behavioral: Negative for worsening agitation or other fatigue All other system neg per  pt    Objective:   Physical Exam BP 124/76   Pulse 93   Temp 98.4 F (36.9 C) (Oral)   Ht 5' (1.524 m)   Wt 144 lb (65.3 kg)   SpO2 93%   BMI 28.12 kg/m  VS noted,  Constitutional: Pt appears in NAD HENT: Head: NCAT.  Right Ear: External ear normal.  Left Ear: External ear normal.  Eyes: . Pupils are equal, round, and reactive to light. Conjunctivae and EOM are normal Nose: without d/c or deformity Neck: Neck supple. Gross normal ROM Cardiovascular: Normal rate and regular rhythm.   Pulmonary/Chest: Effort normal and breath sounds without rales or wheezing.  Neurological: Pt is alert. At baseline orientation, motor grossly intact, cn 2-12 intact Skin: Skin is warm. No rashes, other new lesions, no LE edema Psychiatric: Pt behavior is normal without agitation , mild nervous    Assessment & Plan:

## 2018-08-04 NOTE — Assessment & Plan Note (Signed)
stable overall by history and exam, recent data reviewed with pt, and pt to continue medical treatment as before,  to f/u any worsening symptoms or concerns  

## 2018-08-04 NOTE — Patient Instructions (Signed)
Please take all new medication as prescribed - the antibiotic  You will be contacted regarding the referral for: Neurology  Please consider seeing Dr Fonnie Birkenhead for the neck pain  Please take all new medication as prescribed - the muscle relaxer as needed  Please continue all other medications as before, and refills have been done if requested.  Please have the pharmacy call with any other refills you may need.  Please keep your appointments with your specialists as you may have planned

## 2018-08-04 NOTE — Assessment & Plan Note (Signed)
Mild to mod, for antibx course,  to f/u any worsening symptoms or concerns 

## 2018-08-04 NOTE — Assessment & Plan Note (Signed)
Also to add muscle relaxer prn, f/u ortho as planned

## 2018-08-28 ENCOUNTER — Encounter: Payer: Self-pay | Admitting: Internal Medicine

## 2018-09-28 ENCOUNTER — Other Ambulatory Visit: Payer: Self-pay | Admitting: Internal Medicine

## 2018-09-28 NOTE — Telephone Encounter (Signed)
Done erx 

## 2018-10-26 ENCOUNTER — Other Ambulatory Visit: Payer: Self-pay | Admitting: Internal Medicine

## 2018-11-12 ENCOUNTER — Ambulatory Visit: Payer: Self-pay

## 2018-11-12 NOTE — Telephone Encounter (Signed)
Pt called stating that she has chest pain center chest . She rates the pain 3-4 and constant. She has a dry cough no fever. She states that last night she got OOB to the bathroom and she felt that her heart was racing. She does have sweats on ocassion. This pain does not radiate.  She takes medication for acid reflux but has not had reflux for a while. Pt states that she is retired and is mostly saying at home and has no known exposure to COVID-19.  Care advice read to patient. Pt verbalized understanding of all instructions.  Call transferred to office [facility] for scheduling. Reason for Disposition . [1] Chest pain lasting <= 5 minutes AND [2] NO chest pain or cardiac symptoms now(Exceptions: pains lasting a few seconds)  Answer Assessment - Initial Assessment Questions 1. LOCATION: "Where does it hurt?"       Center chest 2. RADIATION: "Does the pain go anywhere else?" (e.g., into neck, jaw, arms, back)     no 3. ONSET: "When did the chest pain begin?" (Minutes, hours or days)      2-3 days ago 4. PATTERN "Does the pain come and go, or has it been constant since it started?"  "Does it get worse with exertion?"      no 5. DURATION: "How long does it last" (e.g., seconds, minutes, hours)     Comes and goes 6. SEVERITY: "How bad is the pain?"  (e.g., Scale 1-10; mild, moderate, or severe)    - MILD (1-3): doesn't interfere with normal activities     - MODERATE (4-7): interferes with normal activities or awakens from sleep    - SEVERE (8-10): excruciating pain, unable to do any normal activities       3-4 7. CARDIAC RISK FACTORS: "Do you have any history of heart problems or risk factors for heart disease?" (e.g., prior heart attack, angina; high blood pressure, diabetes, being overweight, high cholesterol, smoking, or strong family history of heart disease)   Family hx and high cholesterol 8. PULMONARY RISK FACTORS: "Do you have any history of lung disease?"  (e.g., blood clots in lung, asthma,  emphysema, birth control pills)     No 9. CAUSE: "What do you think is causing the chest pain?"     unsure 10. OTHER SYMPTOMS: "Do you have any other symptoms?" (e.g., dizziness, nausea, vomiting, sweating, fever, difficulty breathing, cough)       Sweats cough 11. PREGNANCY: "Is there any chance you are pregnant?" "When was your last menstrual period?"      N/A  Protocols used: CHEST PAIN-A-AH

## 2018-11-13 ENCOUNTER — Ambulatory Visit: Payer: Medicare Other | Admitting: Internal Medicine

## 2018-11-13 ENCOUNTER — Other Ambulatory Visit: Payer: Self-pay

## 2018-11-17 ENCOUNTER — Other Ambulatory Visit (INDEPENDENT_AMBULATORY_CARE_PROVIDER_SITE_OTHER): Payer: Medicare Other

## 2018-11-17 ENCOUNTER — Ambulatory Visit (INDEPENDENT_AMBULATORY_CARE_PROVIDER_SITE_OTHER): Payer: Medicare Other | Admitting: Internal Medicine

## 2018-11-17 ENCOUNTER — Other Ambulatory Visit: Payer: Self-pay

## 2018-11-17 ENCOUNTER — Ambulatory Visit (INDEPENDENT_AMBULATORY_CARE_PROVIDER_SITE_OTHER)
Admission: RE | Admit: 2018-11-17 | Discharge: 2018-11-17 | Disposition: A | Discharge status: Home / Self Care | Payer: Medicare Other | Source: Ambulatory Visit | Attending: Internal Medicine | Admitting: Internal Medicine

## 2018-11-17 DIAGNOSIS — D509 Iron deficiency anemia, unspecified: Secondary | ICD-10-CM

## 2018-11-17 DIAGNOSIS — R5383 Other fatigue: Secondary | ICD-10-CM | POA: Diagnosis not present

## 2018-11-17 DIAGNOSIS — Z0001 Encounter for general adult medical examination with abnormal findings: Secondary | ICD-10-CM

## 2018-11-17 DIAGNOSIS — G43009 Migraine without aura, not intractable, without status migrainosus: Secondary | ICD-10-CM

## 2018-11-17 DIAGNOSIS — E559 Vitamin D deficiency, unspecified: Secondary | ICD-10-CM

## 2018-11-17 DIAGNOSIS — R739 Hyperglycemia, unspecified: Secondary | ICD-10-CM

## 2018-11-17 DIAGNOSIS — R0609 Other forms of dyspnea: Secondary | ICD-10-CM

## 2018-11-17 DIAGNOSIS — E538 Deficiency of other specified B group vitamins: Secondary | ICD-10-CM

## 2018-11-17 LAB — BASIC METABOLIC PANEL
BUN: 13 mg/dL (ref 6–23)
CO2: 29 mEq/L (ref 19–32)
Calcium: 9.5 mg/dL (ref 8.4–10.5)
Chloride: 103 mEq/L (ref 96–112)
Creatinine, Ser: 0.86 mg/dL (ref 0.40–1.20)
GFR: 65.78 mL/min (ref >60.00)
Glucose, Bld: 98 mg/dL (ref 70–99)
Potassium: 4.2 mEq/L (ref 3.5–5.1)
Sodium: 139 mEq/L (ref 135–145)

## 2018-11-17 LAB — VITAMIN D 25 HYDROXY (VIT D DEFICIENCY, FRACTURES): VITD: 58.74 ng/mL (ref 30.00–100.00)

## 2018-11-17 LAB — HEPATIC FUNCTION PANEL
ALT: 24 U/L (ref 0–35)
AST: 22 U/L (ref 0–37)
Albumin: 4.3 g/dL (ref 3.5–5.2)
Alkaline Phosphatase: 93 U/L (ref 39–117)
Bilirubin, Direct: 0 mg/dL (ref 0.0–0.3)
Total Bilirubin: 0.3 mg/dL (ref 0.2–1.2)
Total Protein: 7 g/dL (ref 6.0–8.3)

## 2018-11-17 LAB — CBC WITH DIFFERENTIAL/PLATELET
Basophils Absolute: 0.1 10*3/uL (ref 0.0–0.1)
Basophils Relative: 1.1 % (ref 0.0–3.0)
Eosinophils Absolute: 0.3 10*3/uL (ref 0.0–0.7)
Eosinophils Relative: 2.9 % (ref 0.0–5.0)
HCT: 40.3 % (ref 36.0–46.0)
Hemoglobin: 13.3 g/dL (ref 12.0–15.0)
Lymphocytes Relative: 34.8 % (ref 12.0–46.0)
Lymphs Abs: 3 10*3/uL (ref 0.7–4.0)
MCHC: 33.1 g/dL (ref 30.0–36.0)
MCV: 87.7 fl (ref 78.0–100.0)
Monocytes Absolute: 0.6 10*3/uL (ref 0.1–1.0)
Monocytes Relative: 6.4 % (ref 3.0–12.0)
Neutro Abs: 4.7 10*3/uL (ref 1.4–7.7)
Neutrophils Relative %: 54.8 % (ref 43.0–77.0)
Platelets: 358 10*3/uL (ref 150.0–400.0)
RBC: 4.59 Mil/uL (ref 3.87–5.11)
RDW: 14.3 % (ref 11.5–15.5)
WBC: 8.6 10*3/uL (ref 4.0–10.5)

## 2018-11-17 LAB — URINALYSIS, ROUTINE W REFLEX MICROSCOPIC
Bilirubin Urine: NEGATIVE
Hgb urine dipstick: NEGATIVE
Ketones, ur: NEGATIVE
Leukocytes,Ua: NEGATIVE
Nitrite: NEGATIVE
RBC / HPF: NONE SEEN (ref 0–?)
Specific Gravity, Urine: 1.02 (ref 1.000–1.030)
Total Protein, Urine: NEGATIVE
Urine Glucose: NEGATIVE
Urobilinogen, UA: 0.2 (ref 0.0–1.0)
pH: 7.5 (ref 5.0–8.0)

## 2018-11-17 LAB — LIPID PANEL
Cholesterol: 193 mg/dL (ref 0–200)
HDL: 63.1 mg/dL (ref >39.00)
Total CHOL/HDL Ratio: 3
Triglycerides: 481 mg/dL — ABNORMAL HIGH (ref 0.0–149.0)

## 2018-11-17 LAB — IBC PANEL
Iron: 68 ug/dL (ref 42–145)
Saturation Ratios: 18.5 % — ABNORMAL LOW (ref 20.0–50.0)
Transferrin: 262 mg/dL (ref 212.0–360.0)

## 2018-11-17 LAB — BRAIN NATRIURETIC PEPTIDE: Pro B Natriuretic peptide (BNP): 26 pg/mL (ref 0.0–100.0)

## 2018-11-17 LAB — TSH: TSH: 2.01 u[IU]/mL (ref 0.35–4.50)

## 2018-11-17 LAB — HEMOGLOBIN A1C: Hgb A1c MFr Bld: 5.9 % (ref 4.6–6.5)

## 2018-11-17 LAB — LDL CHOLESTEROL, DIRECT: Direct LDL: 94 mg/dL

## 2018-11-17 LAB — VITAMIN B12: Vitamin B-12: 638 pg/mL (ref 211–911)

## 2018-11-17 MED ORDER — BUTALBITAL-APAP-CAFFEINE 50-325-40 MG PO TABS: ORAL_TABLET | ORAL | 0 refills | Status: DC

## 2018-11-17 NOTE — Patient Instructions (Signed)
Please continue all other medications as before, and refills have been done if requested.  Please have the pharmacy call with any other refills you may need.  Please continue your efforts at being more active, low cholesterol diet, and weight control.  You are otherwise up to date with prevention measures today.  Please keep your appointments with your specialists as you may have planned  You will be contacted regarding the referral for: Neurology  Please go to the XRAY Department in the Basement (go straight as you get off the elevator) for the x-ray testing  Please go to the LAB in the Basement (turn left off the elevator) for the tests to be done today  You will be contacted by phone if any changes need to be made immediately.  Otherwise, you will receive a letter about your results with an explanation, but please check with MyChart first.  Please remember to sign up for MyChart if you have not done so, as this will be important to you in the future with finding out test results, communicating by private email, and scheduling acute appointments online when needed.  Please return in 6 months, or sooner if needed

## 2018-11-17 NOTE — Progress Notes (Signed)
[name], female   DOB: 11-15-1950, 68 y.o.   MRN: [id number]  Virtual Visit via Video Note  I connected with [name] on 11/17/18 at  1:40 PM EDT by a video enabled telemedicine application and verified that I am speaking with the correct person using two identifiers.  Location: Patient: at home Provider: at office   I discussed the limitations of evaluation and management by telemedicine and the availability of in person appointments. The patient expressed understanding and agreed to proceed.  History of Present Illness: Here for wellness and f/u;  Overall doing ok;    Pt denies neurological change such as new headache, facial or extremity weakness.  Pt denies polydipsia, polyuria, or low sugar symptoms. Pt states overall good compliance with treatment and medications, good tolerability, and has been trying to follow appropriate diet.  Pt denies worsening depressive symptoms, suicidal ideation or panic. No fever, night sweats, wt loss, loss of appetite, or other constitutional symptoms.  Pt states good ability with ADL's, has low fall risk, home safety reviewed and adequate, no other significant changes in hearing or vision, and only occasionally active with exercise. Pt denies chest pain, wheezing, orthopnea, PND, increased LE swelling, palpitations, dizziness or syncope, but has had mild sob/doe without wheezing after recent 3 days of feverish and non prod cough now resolved.   Does c/o ongoing fatigue, but denies signficant daytime hypersomnolence.  [facility] [place] [facility] [place] [facility].  Still walking 3 miles per day but more tired after that as well. Also, Pt denies new neurological symptoms such as new facial or extremity weakness or numbness but has also had 3-4 wks onset unsusually frequent and severe typical migraine for her, asks to see neurology Past Medical History:  Diagnosis Date  . ALLERGIC RHINITIS   . [name]   . ANXIETY   .  Arthritis   . ASTHMA   . Chronic headaches   . COMMON MIGRAINE   . DEPRESSION   . Diabetes ([place])   . DIVERTICULOSIS, COLON   . GERD (gastroesophageal reflux disease) 09/26/2015  . HYPERLIPIDEMIA   . [name]   . Mild mitral regurgitation by prior echocardiogram   . Pneumonia    Past Surgical History:  Procedure Laterality Date  . ABDOMINAL HYSTERECTOMY    . APPENDECTOMY    . CHOLECYSTECTOMY      reports that she quit smoking about 26 years ago. Her smoking use included cigarettes. She has a 20.00 pack-year smoking history. She has never used smokeless tobacco. She reports that she does not drink alcohol or use drugs. family history includes Bone cancer in her maternal aunt; Breast cancer in her maternal aunt; Cancer in her maternal uncle; Heart disease in her brother, father, mother, and paternal grandfather; Leukemia in her maternal grandmother; Other in her son. Allergies  Allergen Reactions  . Nefazodone   . Trazodone And Nefazodone    Current Outpatient Medications on File Prior to Visit  Medication Sig Dispense Refill  . amitriptyline (ELAVIL) 100 MG tablet Take 1 tablet (100 mg total) by mouth at bedtime. 90 tablet 1  . amitriptyline (ELAVIL) 50 MG tablet TAKE 2 TABLETS AT BEDTIME. 180 tablet 1  . aspirin EC 81 MG tablet Take 1 tablet (81 mg total) by mouth daily. 90 tablet 11  . atorvastatin (LIPITOR) 10 MG tablet TAKE 1 TABLET ONCE DAILY. 90 tablet 0  . azithromycin (ZITHROMAX Z-PAK) 250 MG tablet 2 tab  by mouth day 1, then 1 per day 6 tablet 1  . Calcium Carbonate-Vit D-Min (CALCIUM 1200 PO) Take 1 capsule by mouth daily.    . diclofenac sodium (VOLTAREN) 1 % GEL Apply 4 g topically 4 (four) times daily as needed. 400 g 11  . estradiol (VIVELLE-DOT) 0.05 MG/24HR patch estradiol 0.05 mg/24 hr semiweekly transdermal patch  Apply patch twice weekly    . FABIOR 0.1 % FOAM apply TO FACE EVERY DAY AT BEDTIME over moisturizer  5  . famotidine (PEPCID) 20 MG  tablet One at bedtime 30 tablet 11  . linaclotide (LINZESS) 145 MCG CAPS capsule Take 1 capsule by mouth daily. 30 capsule 4  . meloxicam (MOBIC) 15 MG tablet Take 15 mg by mouth daily.     Marland Kitchen. omeprazole (PRILOSEC) 20 MG capsule TAKE (1) CAPSULE DAILY. 90 capsule 0  . pantoprazole (PROTONIX) 40 MG tablet Take 1 tablet (40 mg total) by mouth daily. Take 30-60 min before first meal of the day (Patient taking differently: Take 40 mg by mouth as needed. Take 30-60 min before first meal of the day) 30 tablet 2  . tiZANidine (ZANAFLEX) 2 MG tablet Take 1 tablet (2 mg total) by mouth every 6 (six) hours as needed for muscle spasms. 60 tablet 1   No current facility-administered medications on file prior to visit.     Observations/Objective: Alert, NAD, appropriate mood and affect, resps normal, cn 2-12 intact, moves all 4s, no visible rash or swelling Lab Results  Component Value Date   WBC 8.6 11/17/2018   HGB 13.3 11/17/2018   HCT 40.3 11/17/2018   PLT 358.0 11/17/2018   GLUCOSE 98 11/17/2018   CHOL 193 11/17/2018   TRIG (H) 11/17/2018    481.0 Triglyceride is over 400; calculations on Lipids are invalid.   HDL 63.10 11/17/2018   LDLDIRECT 94.0 11/17/2018   LDLCALC 74 10/09/2017   ALT 24 11/17/2018   AST 22 11/17/2018   NA 139 11/17/2018   K 4.2 11/17/2018   CL 103 11/17/2018   CREATININE 0.86 11/17/2018   BUN 13 11/17/2018   CO2 29 11/17/2018   TSH 2.01 11/17/2018   INR 1.0 05/10/2008   HGBA1C 5.9 11/17/2018   Assessment and Plan: See notes  Follow Up Instructions: See notes   I discussed the assessment and treatment plan with the patient. The patient was provided an opportunity to ask questions and all were answered. The patient agreed with the plan and demonstrated an understanding of the instructions.   The patient was advised to call back or seek an in-person evaluation if the symptoms worsen or if the condition fails to improve as anticipated.   Oliver BarreJames John, MD

## 2018-11-18 ENCOUNTER — Telehealth: Payer: Self-pay | Admitting: Internal Medicine

## 2018-11-18 NOTE — Telephone Encounter (Signed)
Medication Refill - Medication: HYDROcodone-homatropine (HYCODAN) 5-1.5 MG/5ML syrup [[id number]]   Has the patient contacted their pharmacy? No. (Agent: If no, request that the patient contact the pharmacy for the refill.) The patient is out of this cough medication and needs it urgently.   Preferred Pharmacy (with phone number or street name):  [facility], [place]. [phone] (Phone) [phone] (Fax)     Agent: Please be advised that RX refills may take up to 3 business days. We ask that you follow-up with your pharmacy.

## 2018-11-19 ENCOUNTER — Telehealth: Payer: Self-pay

## 2018-11-19 MED ORDER — HYDROCODONE-HOMATROPINE 5-1.5 MG/5ML PO SYRP: 5.0000 mL | ORAL_SOLUTION | Freq: Four times a day (QID) | ORAL | 0 refills | Status: AC | PRN

## 2018-11-19 NOTE — Telephone Encounter (Signed)
Pt would like to know if it would be ok if she exercised to help her triglycerides? She also stated that during the summer she eats a lot of fruit for breakfast, lunch and dinner. She would like to know if that is ok for her to continue to do or what do you recommend?   Copied from [place] [id number]. Topic: General - Inquiry >> Nov 19, 2018 11:57 AM Percell Belt A wrote: Reason for CRM:  pt called in and has several question about her labs.  She is concerned about some of the numbers that she seen on mychart and would like to discuss them  Best number  [phone]

## 2018-11-19 NOTE — Telephone Encounter (Signed)
For elevated triglycerides (which is the fat part of cholesterol)  , we should eat a strict lower fat diet, and work on weight loss as well.  Also sugar control is important as the triglycerides can be high as a "downstream" effect of higher sugars.  I can refer to DM education if she likes.

## 2018-11-19 NOTE — Addendum Note (Signed)
Addended by: Biagio Borg on: 11/19/2018 12:23 PM   Modules accepted: Orders

## 2018-11-19 NOTE — Telephone Encounter (Signed)
Done erx 

## 2018-11-20 ENCOUNTER — Encounter: Payer: Self-pay | Admitting: Internal Medicine

## 2018-11-20 DIAGNOSIS — R5383 Other fatigue: Secondary | ICD-10-CM | POA: Insufficient documentation

## 2018-11-20 NOTE — Telephone Encounter (Signed)
Pt has been informed and said she will put a little thought into the referral and call back if she would like to proceed.

## 2018-11-20 NOTE — Assessment & Plan Note (Signed)

## 2018-11-20 NOTE — Assessment & Plan Note (Addendum)
Also for cxr, r/o pna  In addition to the time spent performing CPE, I spent an additional 25 minutes face to face,in which greater than 50% of this time was spent in counseling and coordination of care for patient's acute illness as documented, including the differential dx, treatment, further evaluation and other management of doe, fatigue, migraine, iron def anemia, hyperglycemia

## 2018-11-20 NOTE — Assessment & Plan Note (Signed)
Etiology unclear, Exam otherwise benign, to check labs as documented, follow with expectant management  

## 2018-11-20 NOTE — Assessment & Plan Note (Signed)
stable overall by history and exam, recent data reviewed with pt, and pt to continue medical treatment as before,  to f/u any worsening symptoms or concerns  

## 2018-11-20 NOTE — Assessment & Plan Note (Signed)
With increased freq and severity, for med refill, refer neurology per pt request

## 2018-11-20 NOTE — Assessment & Plan Note (Signed)
also for f/u cbc, iron panel

## 2018-12-09 ENCOUNTER — Encounter: Payer: Self-pay | Admitting: Internal Medicine

## 2019-01-21 ENCOUNTER — Other Ambulatory Visit: Payer: Self-pay

## 2019-01-21 ENCOUNTER — Ambulatory Visit (INDEPENDENT_AMBULATORY_CARE_PROVIDER_SITE_OTHER): Payer: Medicare Other | Admitting: Internal Medicine

## 2019-01-21 DIAGNOSIS — Z20828 Contact with and (suspected) exposure to other viral communicable diseases: Secondary | ICD-10-CM

## 2019-01-21 DIAGNOSIS — Z20822 Contact with and (suspected) exposure to covid-19: Secondary | ICD-10-CM

## 2019-01-21 DIAGNOSIS — R059 Cough, unspecified: Secondary | ICD-10-CM

## 2019-01-21 DIAGNOSIS — R739 Hyperglycemia, unspecified: Secondary | ICD-10-CM | POA: Diagnosis not present

## 2019-01-21 DIAGNOSIS — R05 Cough: Secondary | ICD-10-CM | POA: Diagnosis not present

## 2019-01-21 DIAGNOSIS — R062 Wheezing: Secondary | ICD-10-CM | POA: Diagnosis not present

## 2019-01-21 MED ORDER — DOXYCYCLINE HYCLATE 100 MG PO TABS: 100.0000 mg | ORAL_TABLET | Freq: Two times a day (BID) | ORAL | 0 refills | Status: DC

## 2019-01-21 MED ORDER — HYDROCODONE-HOMATROPINE 5-1.5 MG/5ML PO SYRP: 5.0000 mL | ORAL_SOLUTION | Freq: Four times a day (QID) | ORAL | 0 refills | Status: AC | PRN

## 2019-01-21 MED ORDER — ALBUTEROL SULFATE HFA 108 (90 BASE) MCG/ACT IN AERS: 2.0000 | INHALATION_SPRAY | Freq: Four times a day (QID) | RESPIRATORY_TRACT | 1 refills | Status: DC | PRN

## 2019-01-21 NOTE — Progress Notes (Signed)
Patient ID: Samantha SauceKaren D Sing, female   DOB: 17-Aug-1950, 68 y.o.   MRN: [id number]  Virtual Visit via Video Note  I connected with Samantha SauceKaren D Dorsey on 01/21/19 at  1:00 PM EDT by a video enabled telemedicine application and verified that I am speaking with the correct person using two identifiers.  Location: Patient: at home Provider: at office   I discussed the limitations of evaluation and management by telemedicine and the availability of in person appointments. The patient expressed understanding and agreed to proceed.  History of Present Illness: Here with acute onset mild to mod 2-3 days ST, HA, general weakness and malaise, with prod cough greenish sputum with mild intermittent pleuritic right CP, but Pt denies other chest pain, increased sob or doe, wheezing, orthopnea, PND, increased LE swelling, palpitations, dizziness or syncope, except for onset mild wheezing sob since last PM.   Pt denies new neurological symptoms such as new headache, or facial or extremity weakness or numbness   Pt denies polydipsia, polyuria Past Medical History:  Diagnosis Date  . ALLERGIC RHINITIS   . [name]   . ANXIETY   . Arthritis   . ASTHMA   . Chronic headaches   . COMMON MIGRAINE   . DEPRESSION   . Diabetes ([facility])   . DIVERTICULOSIS, COLON   . GERD (gastroesophageal reflux disease) 09/26/2015  . HYPERLIPIDEMIA   . [name]   . Mild mitral regurgitation by prior echocardiogram   . Pneumonia    Past Surgical History:  Procedure Laterality Date  . ABDOMINAL HYSTERECTOMY    . APPENDECTOMY    . CHOLECYSTECTOMY      reports that she quit smoking about 26 years ago. Her smoking use included cigarettes. She has a 20.00 pack-year smoking history. She has never used smokeless tobacco. She reports that she does not drink alcohol or use drugs. family history includes Bone cancer in her maternal aunt; Breast cancer in her maternal aunt; Cancer in her maternal uncle; Heart  disease in her brother, father, mother, and paternal grandfather; Leukemia in her maternal grandmother; Other in her son. Allergies  Allergen Reactions  . Nefazodone   . Trazodone And Nefazodone    Current Outpatient Medications on File Prior to Visit  Medication Sig Dispense Refill  . amitriptyline (ELAVIL) 100 MG tablet Take 1 tablet (100 mg total) by mouth at bedtime. 90 tablet 1  . amitriptyline (ELAVIL) 50 MG tablet TAKE 2 TABLETS AT BEDTIME. 180 tablet 1  . aspirin EC 81 MG tablet Take 1 tablet (81 mg total) by mouth daily. 90 tablet 11  . atorvastatin (LIPITOR) 10 MG tablet TAKE 1 TABLET ONCE DAILY. 90 tablet 0  . butalbital-acetaminophen-caffeine (FIORICET) 50-325-40 MG tablet TAKE 1 TABLET TWICE DAILY AS NEEDED FOR HEADACHE. 60 tablet 0  . Calcium Carbonate-Vit D-Min (CALCIUM 1200 PO) Take 1 capsule by mouth daily.    . diclofenac sodium (VOLTAREN) 1 % GEL Apply 4 g topically 4 (four) times daily as needed. 400 g 11  . estradiol (VIVELLE-DOT) 0.05 MG/24HR patch estradiol 0.05 mg/24 hr semiweekly transdermal patch  Apply patch twice weekly    . FABIOR 0.1 % FOAM apply TO FACE EVERY DAY AT BEDTIME over moisturizer  5  . famotidine (PEPCID) 20 MG tablet One at bedtime 30 tablet 11  . linaclotide (LINZESS) 145 MCG CAPS capsule Take 1 capsule by mouth daily. 30 capsule 4  . meloxicam (MOBIC) 15 MG tablet Take 15 mg by mouth daily.     .Marland Kitchen  omeprazole (PRILOSEC) 20 MG capsule TAKE (1) CAPSULE DAILY. 90 capsule 0  . pantoprazole (PROTONIX) 40 MG tablet Take 1 tablet (40 mg total) by mouth daily. Take 30-60 min before first meal of the day (Patient taking differently: Take 40 mg by mouth as needed. Take 30-60 min before first meal of the day) 30 tablet 2  . tiZANidine (ZANAFLEX) 2 MG tablet Take 1 tablet (2 mg total) by mouth every 6 (six) hours as needed for muscle spasms. 60 tablet 1   No current facility-administered medications on file prior to visit.    Observations/Objective: Alert,  NAD, mild ill appearing, appropriate mood and affect, resps normal, cn 2-12 intact, moves all 4s, no visible rash or swelling Lab Results  Component Value Date   WBC 8.6 11/17/2018   HGB 13.3 11/17/2018   HCT 40.3 11/17/2018   PLT 358.0 11/17/2018   GLUCOSE 98 11/17/2018   CHOL 193 11/17/2018   TRIG (H) 11/17/2018    481.0 Triglyceride is over 400; calculations on Lipids are invalid.   HDL 63.10 11/17/2018   LDLDIRECT 94.0 11/17/2018   LDLCALC 74 10/09/2017   ALT 24 11/17/2018   AST 22 11/17/2018   NA 139 11/17/2018   K 4.2 11/17/2018   CL 103 11/17/2018   CREATININE 0.86 11/17/2018   BUN 13 11/17/2018   CO2 29 11/17/2018   TSH 2.01 11/17/2018   INR 1.0 05/10/2008   HGBA1C 5.9 11/17/2018   Assessment and Plan: See notes  Follow Up Instructions: See notes   I discussed the assessment and treatment plan with the patient. The patient was provided an opportunity to ask questions and all were answered. The patient agreed with the plan and demonstrated an understanding of the instructions.   The patient was advised to call back or seek an in-person evaluation if the symptoms worsen or if the condition fails to improve as anticipated.  Cathlean Cower, MD

## 2019-01-23 ENCOUNTER — Encounter: Payer: Self-pay | Admitting: Internal Medicine

## 2019-01-23 LAB — NOVEL CORONAVIRUS, NAA: SARS-CoV-2, NAA: NOT DETECTED

## 2019-01-23 NOTE — Assessment & Plan Note (Signed)
Mild to mod, c/w bronchitis vs pna, for antibx course, cough med prn, for COVID testing referral, consider cxr in this office if covid neg or ED for any worsening,  to f/u any worsening symptoms or concerns

## 2019-01-23 NOTE — Assessment & Plan Note (Signed)
Mild to mod, c/w bronchospasm, for albuterol inhaler prn,  to f/u any worsening symptoms or concerns

## 2019-01-23 NOTE — Patient Instructions (Signed)
Please take all new medication as prescribed  Please continue all other medications as before, and refills have been done if requested.  Please have the pharmacy call with any other refills you may need.  Please keep your appointments with your specialists as you may have planned     

## 2019-01-23 NOTE — Assessment & Plan Note (Signed)
stable overall by history and exam, recent data reviewed with pt, and pt to continue medical treatment as before,  to f/u any worsening symptoms or concerns  

## 2019-01-25 ENCOUNTER — Telehealth: Payer: Self-pay

## 2019-01-25 NOTE — Telephone Encounter (Signed)
-----   Message from Biagio Borg, MD sent at 01/23/2019  4:09 PM EDT ----- Left message on MyChart, pt to cont same tx   Shirron to please inform pt, COVID is neg

## 2019-01-25 NOTE — Telephone Encounter (Signed)
Pt has viewed results via MyChart  

## 2019-02-10 ENCOUNTER — Ambulatory Visit (HOSPITAL_COMMUNITY)
Admission: EM | Admit: 2019-02-10 | Discharge: 2019-02-10 | Disposition: A | Discharge status: Home / Self Care | Payer: Medicare Other | Attending: Family Medicine | Admitting: Family Medicine

## 2019-02-10 ENCOUNTER — Encounter (HOSPITAL_COMMUNITY): Payer: Self-pay

## 2019-02-10 ENCOUNTER — Ambulatory Visit (INDEPENDENT_AMBULATORY_CARE_PROVIDER_SITE_OTHER): Payer: Medicare Other

## 2019-02-10 ENCOUNTER — Other Ambulatory Visit: Payer: Self-pay | Admitting: Internal Medicine

## 2019-02-10 ENCOUNTER — Telehealth: Payer: Self-pay | Admitting: Internal Medicine

## 2019-02-10 ENCOUNTER — Other Ambulatory Visit: Payer: Self-pay

## 2019-02-10 DIAGNOSIS — Z87891 Personal history of nicotine dependence: Secondary | ICD-10-CM | POA: Insufficient documentation

## 2019-02-10 DIAGNOSIS — R059 Cough, unspecified: Secondary | ICD-10-CM

## 2019-02-10 DIAGNOSIS — R0981 Nasal congestion: Secondary | ICD-10-CM | POA: Diagnosis not present

## 2019-02-10 DIAGNOSIS — R42 Dizziness and giddiness: Secondary | ICD-10-CM | POA: Insufficient documentation

## 2019-02-10 DIAGNOSIS — J45909 Unspecified asthma, uncomplicated: Secondary | ICD-10-CM | POA: Insufficient documentation

## 2019-02-10 DIAGNOSIS — K219 Gastro-esophageal reflux disease without esophagitis: Secondary | ICD-10-CM | POA: Diagnosis not present

## 2019-02-10 DIAGNOSIS — E1165 Type 2 diabetes mellitus with hyperglycemia: Secondary | ICD-10-CM | POA: Diagnosis not present

## 2019-02-10 DIAGNOSIS — G479 Sleep disorder, unspecified: Secondary | ICD-10-CM | POA: Diagnosis not present

## 2019-02-10 DIAGNOSIS — I10 Essential (primary) hypertension: Secondary | ICD-10-CM | POA: Insufficient documentation

## 2019-02-10 DIAGNOSIS — R5383 Other fatigue: Secondary | ICD-10-CM | POA: Insufficient documentation

## 2019-02-10 DIAGNOSIS — R05 Cough: Secondary | ICD-10-CM

## 2019-02-10 DIAGNOSIS — Z79899 Other long term (current) drug therapy: Secondary | ICD-10-CM | POA: Diagnosis not present

## 2019-02-10 DIAGNOSIS — Z20828 Contact with and (suspected) exposure to other viral communicable diseases: Secondary | ICD-10-CM | POA: Diagnosis not present

## 2019-02-10 DIAGNOSIS — D509 Iron deficiency anemia, unspecified: Secondary | ICD-10-CM | POA: Insufficient documentation

## 2019-02-10 DIAGNOSIS — R630 Anorexia: Secondary | ICD-10-CM | POA: Insufficient documentation

## 2019-02-10 DIAGNOSIS — J029 Acute pharyngitis, unspecified: Secondary | ICD-10-CM | POA: Insufficient documentation

## 2019-02-10 DIAGNOSIS — G4700 Insomnia, unspecified: Secondary | ICD-10-CM | POA: Diagnosis not present

## 2019-02-10 DIAGNOSIS — K5790 Diverticulosis of intestine, part unspecified, without perforation or abscess without bleeding: Secondary | ICD-10-CM | POA: Insufficient documentation

## 2019-02-10 DIAGNOSIS — F329 Major depressive disorder, single episode, unspecified: Secondary | ICD-10-CM | POA: Insufficient documentation

## 2019-02-10 DIAGNOSIS — Z7982 Long term (current) use of aspirin: Secondary | ICD-10-CM | POA: Insufficient documentation

## 2019-02-10 DIAGNOSIS — E785 Hyperlipidemia, unspecified: Secondary | ICD-10-CM | POA: Diagnosis not present

## 2019-02-10 LAB — COMPREHENSIVE METABOLIC PANEL
ALT: 26 U/L (ref 0–44)
AST: 24 U/L (ref 15–41)
Albumin: 4.3 g/dL (ref 3.5–5.0)
Alkaline Phosphatase: 75 U/L (ref 38–126)
Anion gap: 10 (ref 5–15)
BUN: 12 mg/dL (ref 8–23)
CO2: 26 mmol/L (ref 22–32)
Calcium: 9.8 mg/dL (ref 8.9–10.3)
Chloride: 105 mmol/L (ref 98–111)
Creatinine, Ser: 1.03 mg/dL — ABNORMAL HIGH (ref 0.44–1.00)
GFR calc Af Amer: >60 mL/min (ref >60)
GFR calc non Af Amer: 56 mL/min — ABNORMAL LOW (ref >60)
Glucose, Bld: 96 mg/dL (ref 70–99)
Potassium: 4.4 mmol/L (ref 3.5–5.1)
Sodium: 141 mmol/L (ref 135–145)
Total Bilirubin: 0.6 mg/dL (ref 0.3–1.2)
Total Protein: 7.6 g/dL (ref 6.5–8.1)

## 2019-02-10 LAB — CBC WITH DIFFERENTIAL/PLATELET
Abs Immature Granulocytes: 0.03 10*3/uL (ref 0.00–0.07)
Basophils Absolute: 0.1 10*3/uL (ref 0.0–0.1)
Basophils Relative: 1 %
Eosinophils Absolute: 0.3 10*3/uL (ref 0.0–0.5)
Eosinophils Relative: 3 %
HCT: 43.1 % (ref 36.0–46.0)
Hemoglobin: 14.6 g/dL (ref 12.0–15.0)
Immature Granulocytes: 0 %
Lymphocytes Relative: 31 %
Lymphs Abs: 2.7 10*3/uL (ref 0.7–4.0)
MCH: 29.9 pg (ref 26.0–34.0)
MCHC: 33.9 g/dL (ref 30.0–36.0)
MCV: 88.1 fL (ref 80.0–100.0)
Monocytes Absolute: 0.6 10*3/uL (ref 0.1–1.0)
Monocytes Relative: 7 %
Neutro Abs: 5 10*3/uL (ref 1.7–7.7)
Neutrophils Relative %: 58 %
Platelets: 364 10*3/uL (ref 150–400)
RBC: 4.89 MIL/uL (ref 3.87–5.11)
RDW: 13.6 % (ref 11.5–15.5)
WBC: 8.6 10*3/uL (ref 4.0–10.5)
nRBC: 0 % (ref 0.0–0.2)

## 2019-02-10 LAB — TSH: TSH: 2.759 u[IU]/mL (ref 0.350–4.500)

## 2019-02-10 MED ORDER — BENZONATATE 100 MG PO CAPS: 100.0000 mg | ORAL_CAPSULE | Freq: Three times a day (TID) | ORAL | 0 refills | Status: DC | PRN

## 2019-02-10 NOTE — Discharge Instructions (Addendum)
Your chest xray is normal today, no new findings or changes.  I have sent a medication you may try to see if this is helpful with cough, 1-2 capsules every 8 hours as needed.  Continue with daily protonix which it looks like has been prescribed in the past.  Use of inhaler as needed for tightness or shortness of breath .  Continue to follow with your primary care provider for any persistent symptoms.  Any worsening of symptoms please return or go to the ER- increased chest pain , shortness of breath , leg pain or swelling.  Covid testing pending, will call you if this returns positive.

## 2019-02-10 NOTE — Telephone Encounter (Signed)
Patient has been advised that [facility] or any [facility] will have appropriate PPE to see patient safely and can do chest xray if needed---patient will go to [facility]

## 2019-02-10 NOTE — Telephone Encounter (Signed)
Xray is fine here since covid is neg and pt not living with a covid + person  I will order, ok to ask pt to come in

## 2019-02-10 NOTE — Telephone Encounter (Signed)
Patient called stating that she still has bronchitis from last doxy visit.  States that she has congestion and feels very tired.  She also states she is having dull chest pain.  Patient would like to schedule an in office visit for tomorrow.   Please advise.

## 2019-02-10 NOTE — Telephone Encounter (Signed)
Routing to dr john----patient was seen by you on 8/27, given doxycycline, was tested for covid and was negative and also was given albuterol inhaler, no chest xray done---I don't think xray downstairs will allow her to come in with these symptoms---do you want to send in more meds, or should I tell patient to go to ED---please advise, I will call patient back, thanks

## 2019-02-10 NOTE — ED Provider Notes (Signed)
[facility]    CSN: [id number] Arrival date & time: 02/10/19  1412      History   Chief Complaint Chief Complaint  Patient presents with  . Cough  . Dizziness  . Sore Throat  . Anorexia    HPI [name] is a 68 y.o. female.   [name] presents with complaints of worsening cough. Bronchitis a month ago, treated with doxycycline at that time. Helped some. Symptoms have been worsening over the past week. Chest pain and shortness of breath. Can feel it even at rest. Cough was productive, now it is dry. No wheezing. Some nasal congestion. Some throat pain, feels this is related to coughing. No ear pain. Has been taking claritin has helped with congestion. Mild ringing to right ear. No known fevers, a few times in the past week felt like maybe had low temp. No chills. Nausea a week ago, this has resolved. No other gi symptoms. History of migraines, no new headache. No other medications for symptoms. Feels very fatigued, has even been having to nap. 30 years ago smoker, no current. Initially, her grandson had cold symptoms she was around. Lately no other ill contacts. Retired.      Past Medical History:  Diagnosis Date  . ALLERGIC RHINITIS   . [name]   . ANXIETY   . Arthritis   . ASTHMA   . Chronic headaches   . COMMON MIGRAINE   . DEPRESSION   . Diabetes (HCC)   . DIVERTICULOSIS, COLON   . GERD (gastroesophageal reflux disease) 09/26/2015  . HYPERLIPIDEMIA   . [name]   . Mild mitral regurgitation by prior echocardiogram   . Pneumonia     Patient Active Problem List   Diagnosis Date Noted  . Fatigue 11/20/2018  . Headache 04/22/2018  . Neck pain 12/26/2017  . DOE (dyspnea on exertion) 11/10/2017  . Acute upper respiratory infection 10/31/2017  . Rib pain on left side 10/02/2016  . Hyperglycemia 08/09/2016  . GERD (gastroesophageal reflux disease) 09/26/2015  . Abnormal chest x-ray 09/06/2014  . Diverticulitis of  colon without hemorrhage 04/20/2014  . Wheezing 04/20/2014  . Cough 04/06/2014  . Pain in the chest 02/24/2014  . Right wrist tendinitis 12/14/2013  . [facility] arthritis, thumb, degenerative 12/14/2013  . Paresthesia 03/01/2013  . Other malaise and fatigue 03/01/2013  . Pain of right thumb 02/04/2013  . Palpitations 01/26/2013  . Lower gastrointestinal bleed 07/28/2012  . Lower abdominal pain 06/25/2012  . Low back pain 07/29/2011  . Encounter for preventative adult health care exam with abnormal findings 07/26/2011  . Cough variant asthma vs UACS 07/19/2008  . DYSPNEA 06/07/2008  . [name] 11/17/2007  . Anxiety state 11/17/2007  . Essential hypertension 11/17/2007  . DIVERTICULOSIS, COLON 11/17/2007  . [name] 11/17/2007  . Hyperlipidemia 01/26/2007  . DEPRESSION 01/24/2007  . Migraine without aura 01/24/2007  . Allergic rhinitis 01/24/2007    Past Surgical History:  Procedure Laterality Date  . ABDOMINAL HYSTERECTOMY    . APPENDECTOMY    . CHOLECYSTECTOMY      OB History   No obstetric history on file.      Home Medications    Prior to Admission medications   Medication Sig Start Date End Date Taking? Authorizing Provider  albuterol (VENTOLIN HFA) 108 (90 Base) MCG/ACT inhaler Inhale 2 puffs into the lungs every 6 (six) hours as needed for wheezing or shortness of breath. 01/21/19   Corwin LevinsJohn, James W, MD  amitriptyline (ELAVIL) 100 MG tablet Take 1 tablet (100 mg total) by mouth at bedtime. 04/02/18   Biagio Borg, MD  amitriptyline (ELAVIL) 50 MG tablet TAKE 2 TABLETS AT BEDTIME. 09/28/18   Biagio Borg, MD  aspirin EC 81 MG tablet Take 1 tablet (81 mg total) by mouth daily. 02/24/14   Biagio Borg, MD  atorvastatin (LIPITOR) 10 MG tablet TAKE 1 TABLET ONCE DAILY. 10/27/18   Biagio Borg, MD  benzonatate (TESSALON) 100 MG capsule Take 1 capsule (100 mg total) by mouth 3 (three) times daily as needed for cough. 02/10/19   Zigmund Gottron,  NP  butalbital-acetaminophen-caffeine (FIORICET) [phone] MG tablet TAKE 1 TABLET TWICE DAILY AS NEEDED FOR HEADACHE. 11/17/18   Biagio Borg, MD  Calcium Carbonate-Vit D-Min (CALCIUM 1200 PO) Take 1 capsule by mouth daily.    [provider]  diclofenac sodium (VOLTAREN) 1 % GEL Apply 4 g topically 4 (four) times daily as needed. 09/10/16   Biagio Borg, MD  doxycycline (VIBRA-TABS) 100 MG tablet Take 1 tablet (100 mg total) by mouth 2 (two) times daily. 01/21/19   Biagio Borg, MD  estradiol (VIVELLE-DOT) 0.05 MG/24HR patch estradiol 0.05 mg/24 hr semiweekly transdermal patch  Apply patch twice weekly    [provider]  FABIOR 0.1 % FOAM apply TO FACE EVERY DAY AT BEDTIME over moisturizer 01/30/16   [provider]  famotidine (PEPCID) 20 MG tablet One at bedtime 11/11/17   Tanda Rockers, MD  linaclotide [facility]) 145 MCG CAPS capsule Take 1 capsule by mouth daily. 03/02/18   Esterwood, Amy S, PA-C  meloxicam (MOBIC) 15 MG tablet Take 15 mg by mouth daily.  07/10/17   [provider]  omeprazole (PRILOSEC) 20 MG capsule TAKE (1) CAPSULE DAILY. 07/07/18   Biagio Borg, MD  pantoprazole (PROTONIX) 40 MG tablet Take 1 tablet (40 mg total) by mouth daily. Take 30-60 min before first meal of the day Patient taking differently: Take 40 mg by mouth as needed. Take 30-60 min before first meal of the day 11/11/17   Tanda Rockers, MD  tiZANidine (ZANAFLEX) 2 MG tablet Take 1 tablet (2 mg total) by mouth every 6 (six) hours as needed for muscle spasms. 08/04/18   Biagio Borg, MD    Family History Family History  Problem Relation Age of Onset  . Heart disease Father   . Heart disease Brother   . Heart disease Mother   . Leukemia Maternal Grandmother   . Heart disease Paternal Grandfather   . Breast cancer Maternal Aunt   . Bone cancer Maternal Aunt   . Cancer Maternal Uncle        type unknown  . Other Son        Hypoglycemia  . Colon cancer Neg Hx      Social History Social History   Tobacco Use  . Smoking status: Former Smoker    Packs/day: 1.00    Years: 20.00    Pack years: 20.00    Types: Cigarettes    Quit date: 05/27/1992    Years since quitting: 26.7  . Smokeless tobacco: Never Used  Substance Use Topics  . Alcohol use: No    Comment: occasional -1 a month  . Drug use: No     Allergies   Nefazodone and Trazodone and nefazodone   Review of Systems Review of Systems   Physical Exam Triage Vital Signs ED Triage Vitals  Enc Vitals  Group     BP 02/10/19 1440 131/84     Pulse Rate 02/10/19 1440 90     Resp 02/10/19 1440 16     Temp 02/10/19 1440 98.1 F (36.7 C)     Temp Source 02/10/19 1440 Temporal     SpO2 02/10/19 1440 96 %     Weight --      Height --      Head Circumference --      Peak Flow --      Pain Score 02/10/19 1437 4     Pain Loc --      Pain Edu? --      Excl. in GC? --    No data found.  Updated Vital Signs BP 131/84 (BP Location: Right Arm)   Pulse 90   Temp 98.1 F (36.7 C) (Temporal)   Resp 16   SpO2 96%    Physical Exam Constitutional:      General: She is not in acute distress.    Appearance: She is well-developed.  HENT:     Head: Normocephalic and atraumatic.     Right Ear: Tympanic membrane, ear canal and external ear normal.     Left Ear: Tympanic membrane, ear canal and external ear normal.     Nose: Nose normal.     Mouth/Throat:     Pharynx: Uvula midline.     Tonsils: No tonsillar exudate.  Eyes:     Conjunctiva/sclera: Conjunctivae normal.     Pupils: Pupils are equal, round, and reactive to light.  Cardiovascular:     Rate and Rhythm: Normal rate and regular rhythm.     Heart sounds: Normal heart sounds.  Pulmonary:     Effort: Pulmonary effort is normal.     Breath sounds: Normal breath sounds.     Comments: Occasional dry cough noted ; no increased work of breathing  Skin:    General: Skin is warm and dry.  Neurological:     Mental Status: She is  alert and oriented to person, place, and time.      UC Treatments / Results  Labs (all labs ordered are listed, but only abnormal results are displayed) Labs Reviewed  NOVEL CORONAVIRUS, NAA (HOSP ORDER, SEND-OUT TO REF LAB; TAT 18-24 HRS)  CBC WITH DIFFERENTIAL/PLATELET  COMPREHENSIVE METABOLIC PANEL  TSH    EKG   Radiology Dg Chest 2 View  Result Date: 02/10/2019 CLINICAL DATA:  Persistent cough with low-grade fever getting worse. EXAM: CHEST - 2 VIEW COMPARISON:  11/17/2018 FINDINGS: Lungs are adequately inflated without focal airspace consolidation or effusion. Cardiomediastinal silhouette and remainder of the exam is unchanged. IMPRESSION: No active cardiopulmonary disease. Electronically Signed   By: Elberta Fortisaniel  Boyle M.D.   On: 02/10/2019 15:16    Procedures Procedures (including critical care time)  Medications Ordered in UC Medications - No data to display  Initial Impression / Assessment and Plan / UC Course  I have reviewed the triage vital signs and the nursing notes.  Pertinent labs & imaging results that were available during my care of the patient were reviewed by me and considered in my medical decision making (see chart for details).     Non toxic. Afebrile. Per chart review patient has had some persistent cough in the past, has seen pulmonology even. Did recommend more regular use of PPI to see if this is helpful. Patient primarily concerned about her level of fatigue. Cbc, cmp and tsh pending as basic labs. covid pending as  well. Encouraged continued follow up with PCP for persistent symptoms. Return precautions provided. Patient verbalized understanding and agreeable to plan.  Ambulatory out of clinic without difficulty.    Final Clinical Impressions(s) / UC Diagnoses   Final diagnoses:  Cough     Discharge Instructions     Your chest xray is normal today, no new findings or changes.  I have sent a medication you may try to see if this is helpful with  cough, 1-2 capsules every 8 hours as needed.  Continue with daily protonix which it looks like has been prescribed in the past.  Use of inhaler as needed for tightness or shortness of breath .  Continue to follow with your primary care provider for any persistent symptoms.  Any worsening of symptoms please return or go to the ER- increased chest pain , shortness of breath , leg pain or swelling.  Covid testing pending, will call you if this returns positive.     ED Prescriptions    Medication Sig Dispense Auth. Provider   benzonatate (TESSALON) 100 MG capsule Take 1 capsule (100 mg total) by mouth 3 (three) times daily as needed for cough. 21 capsule Georgetta Haber, NP     Controlled Substance Prescriptions [place] Controlled Substance Registry consulted? Not Applicable   Georgetta Haber, NP 02/10/19 [phone]

## 2019-02-10 NOTE — ED Triage Notes (Signed)
Patient report she had bronchitis a month ago and the cough never went away. 2 weeks ago patient started feeling dizzy on and off, loss of appetite,  dull intermittent chest pain and sore throat.

## 2019-02-11 LAB — NOVEL CORONAVIRUS, NAA (HOSP ORDER, SEND-OUT TO REF LAB; TAT 18-24 HRS): SARS-CoV-2, NAA: NOT DETECTED

## 2019-02-12 ENCOUNTER — Telehealth (HOSPITAL_COMMUNITY): Payer: Self-pay | Admitting: Emergency Medicine

## 2019-02-12 NOTE — Telephone Encounter (Signed)
Attempted to reach patient. No answer at this time, mychart message sent

## 2019-02-15 ENCOUNTER — Other Ambulatory Visit: Payer: Self-pay | Admitting: Internal Medicine

## 2019-02-18 ENCOUNTER — Other Ambulatory Visit: Payer: Self-pay | Admitting: Internal Medicine

## 2019-02-19 NOTE — Telephone Encounter (Signed)
Done erx 

## 2019-04-15 ENCOUNTER — Other Ambulatory Visit: Payer: Self-pay | Admitting: Internal Medicine

## 2019-05-11 ENCOUNTER — Telehealth: Payer: Self-pay | Admitting: *Deleted

## 2019-05-11 NOTE — Telephone Encounter (Signed)
Copied from [place] [phone]. Topic: Referral - Request for Referral >> May 11, 2019 10:52 AM Reyne Dumas L wrote: Has patient seen PCP for this complaint? yes *If NO, is insurance requiring patient see PCP for this issue before PCP can refer them? Referral for which specialty: endocrinology Preferred provider/office: Dr. Garnet Koyanagi - [phone] (phone) Reason for referral: blood sugar running high on last test  Pt can be reached at [phone]

## 2019-05-11 NOTE — Telephone Encounter (Signed)
Please ask pt to make ROV, as her A1c every time tested in the last several year have been normal, and should be re-assessed for need for further tx and possible referral if needed

## 2019-05-12 NOTE — Telephone Encounter (Signed)
Patient states she will call back at a later time to schedule. °

## 2019-05-12 NOTE — Telephone Encounter (Signed)
Noted  

## 2019-05-22 ENCOUNTER — Other Ambulatory Visit: Payer: Self-pay | Admitting: Internal Medicine

## 2019-05-23 NOTE — Telephone Encounter (Signed)
Per routine office policy  All routine meds ok for 1 yr, then month to month only for 3 mo until refused after, thanks 

## 2019-06-21 DIAGNOSIS — Z6826 Body mass index (BMI) 26.0-26.9, adult: Secondary | ICD-10-CM | POA: Diagnosis not present

## 2019-06-21 DIAGNOSIS — E781 Pure hyperglyceridemia: Secondary | ICD-10-CM | POA: Diagnosis not present

## 2019-06-21 DIAGNOSIS — E7800 Pure hypercholesterolemia, unspecified: Secondary | ICD-10-CM | POA: Diagnosis not present

## 2019-06-21 DIAGNOSIS — Z713 Dietary counseling and surveillance: Secondary | ICD-10-CM | POA: Diagnosis not present

## 2019-06-21 DIAGNOSIS — Z7189 Other specified counseling: Secondary | ICD-10-CM | POA: Diagnosis not present

## 2019-06-21 DIAGNOSIS — R7303 Prediabetes: Secondary | ICD-10-CM | POA: Diagnosis not present

## 2019-06-21 DIAGNOSIS — I10 Essential (primary) hypertension: Secondary | ICD-10-CM | POA: Diagnosis not present

## 2019-07-05 ENCOUNTER — Ambulatory Visit: Payer: Medicare PPO | Admitting: Neurology

## 2019-07-05 ENCOUNTER — Other Ambulatory Visit: Payer: Self-pay

## 2019-07-05 ENCOUNTER — Telehealth: Payer: Self-pay

## 2019-07-05 NOTE — Telephone Encounter (Signed)
Patient showed up late for 07/05/2019 appointment. This will count as a no show.

## 2019-07-22 DIAGNOSIS — R7303 Prediabetes: Secondary | ICD-10-CM | POA: Diagnosis not present

## 2019-07-24 ENCOUNTER — Ambulatory Visit: Payer: Medicare PPO | Attending: Internal Medicine

## 2019-07-24 DIAGNOSIS — Z23 Encounter for immunization: Secondary | ICD-10-CM

## 2019-07-24 NOTE — Progress Notes (Signed)
   Covid-19 Vaccination Clinic  Name:  NECIA KAMM    MRN: [id number] DOB: Sep 28, 1950  07/24/2019  Ms. Figuereo was observed post Covid-19 immunization for 15 minutes without incidence. She was provided with Vaccine Information Sheet and instruction to access the V-Safe system.   Ms. Stoffer was instructed to call 911 with any severe reactions post vaccine: Marland Kitchen Difficulty breathing  . Swelling of your face and throat  . A fast heartbeat  . A bad rash all over your body  . Dizziness and weakness    Immunizations Administered    Name Date Dose VIS Date Route   Moderna COVID-19 Vaccine 07/24/2019  2:17 AM 0.5 mL 04/27/2019 Intramuscular   Manufacturer: Moderna   Lot: [id number]   NDC: 31121-624-46

## 2019-07-27 ENCOUNTER — Other Ambulatory Visit: Payer: Self-pay | Admitting: Internal Medicine

## 2019-07-27 NOTE — Telephone Encounter (Signed)
Done erx 

## 2019-08-05 ENCOUNTER — Other Ambulatory Visit: Payer: Self-pay

## 2019-08-05 ENCOUNTER — Encounter: Payer: Self-pay | Admitting: Neurology

## 2019-08-05 ENCOUNTER — Ambulatory Visit: Payer: Medicare PPO | Admitting: Neurology

## 2019-08-05 VITALS — BP 111/70 | HR 77 | Temp 97.4°F | Ht 61.0 in | Wt 137.0 lb

## 2019-08-05 DIAGNOSIS — R519 Headache, unspecified: Secondary | ICD-10-CM

## 2019-08-05 DIAGNOSIS — G43019 Migraine without aura, intractable, without status migrainosus: Secondary | ICD-10-CM

## 2019-08-05 DIAGNOSIS — G8929 Other chronic pain: Secondary | ICD-10-CM

## 2019-08-05 MED ORDER — UBRELVY 50 MG PO TABS: 50.0000 mg | ORAL_TABLET | ORAL | 3 refills | Status: DC | PRN

## 2019-08-05 NOTE — Patient Instructions (Signed)
You can continue with amitriptyline for migraine prevention. I would not recommend Fioricet on an ongoing basis for as needed use for migraine.  It can perpetuate headaches and cause rebound headaches. As discussed, for as needed use for your migraines I will prescribe Ubrelvy, 50 mg strength: Take 1 pill at onset of migraine headache, may repeat in 2 hours. May cause sedation and nausea.
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
## 2019-08-11 ENCOUNTER — Telehealth: Payer: Self-pay | Admitting: Neurology

## 2019-08-11 NOTE — Telephone Encounter (Signed)
[employer] pending faxed notes.  

## 2019-08-12 ENCOUNTER — Telehealth: Payer: Self-pay | Admitting: Neurology

## 2019-08-12 NOTE — Telephone Encounter (Signed)
Dr.Patel called to verify the reason behind the patients brain MRI for her PA please FU

## 2019-08-12 NOTE — Telephone Encounter (Signed)
Checked status it is still in review.

## 2019-08-16 NOTE — Telephone Encounter (Signed)
[name]: [id number] (exp. 08/11/19 to 09/10/19) order sent to GI. They will reach out to the patient to schedule.

## 2019-08-16 NOTE — Telephone Encounter (Signed)
[employer] approved the MRI. Auth: [id number] (exp. 08/11/19 to 09/10/19) order was sent to GI. They will reach out to the patient to schedule.

## 2019-08-21 ENCOUNTER — Ambulatory Visit: Payer: Medicare PPO | Attending: Internal Medicine

## 2019-08-21 DIAGNOSIS — Z23 Encounter for immunization: Secondary | ICD-10-CM

## 2019-08-21 NOTE — Progress Notes (Signed)
   Covid-19 Vaccination Clinic  Name:  AVIYANNA COLBAUGH    MRN: [id number] DOB: 04/11/1951  08/21/2019  Ms. Silversmith was observed post Covid-19 immunization for 15 minutes without incident. She was provided with Vaccine Information Sheet and instruction to access the V-Safe system.   Ms. Gimpel was instructed to call 911 with any severe reactions post vaccine: Marland Kitchen Difficulty breathing  . Swelling of face and throat  . A fast heartbeat  . A bad rash all over body  . Dizziness and weakness   Immunizations Administered    Name Date Dose VIS Date Route   Moderna COVID-19 Vaccine 08/21/2019 12:48 PM 0.5 mL 04/27/2019 Intramuscular   Manufacturer: Gala Murdoch   Lot: [id number]   NDC: 38184-037-54

## 2019-09-07 DIAGNOSIS — M9903 Segmental and somatic dysfunction of lumbar region: Secondary | ICD-10-CM | POA: Diagnosis not present

## 2019-09-07 DIAGNOSIS — M5126 Other intervertebral disc displacement, lumbar region: Secondary | ICD-10-CM | POA: Diagnosis not present

## 2019-09-09 DIAGNOSIS — M9903 Segmental and somatic dysfunction of lumbar region: Secondary | ICD-10-CM | POA: Diagnosis not present

## 2019-09-09 DIAGNOSIS — M5126 Other intervertebral disc displacement, lumbar region: Secondary | ICD-10-CM | POA: Diagnosis not present

## 2019-09-11 ENCOUNTER — Encounter: Payer: Self-pay | Admitting: Neurology

## 2019-10-18 ENCOUNTER — Telehealth: Payer: Medicare PPO | Admitting: Internal Medicine

## 2019-10-20 DIAGNOSIS — J019 Acute sinusitis, unspecified: Secondary | ICD-10-CM | POA: Diagnosis not present

## 2019-10-20 DIAGNOSIS — R05 Cough: Secondary | ICD-10-CM | POA: Diagnosis not present

## 2019-11-09 ENCOUNTER — Encounter: Payer: Self-pay | Admitting: Adult Health

## 2019-11-09 ENCOUNTER — Ambulatory Visit: Payer: Self-pay | Admitting: Adult Health

## 2019-11-16 DIAGNOSIS — J45909 Unspecified asthma, uncomplicated: Secondary | ICD-10-CM | POA: Diagnosis not present

## 2019-11-16 DIAGNOSIS — R7303 Prediabetes: Secondary | ICD-10-CM | POA: Diagnosis not present

## 2019-11-16 DIAGNOSIS — R05 Cough: Secondary | ICD-10-CM | POA: Diagnosis not present

## 2019-11-16 DIAGNOSIS — J4521 Mild intermittent asthma with (acute) exacerbation: Secondary | ICD-10-CM | POA: Diagnosis not present

## 2019-11-30 ENCOUNTER — Other Ambulatory Visit: Payer: Self-pay | Admitting: Internal Medicine

## 2019-11-30 NOTE — Telephone Encounter (Signed)
Done erx  Ok to let pt know - due for ROV aug 2021

## 2020-02-07 ENCOUNTER — Other Ambulatory Visit: Payer: Self-pay | Admitting: Internal Medicine

## 2020-02-07 NOTE — Telephone Encounter (Signed)
Done erx 

## 2020-02-29 ENCOUNTER — Other Ambulatory Visit: Payer: Self-pay | Admitting: Internal Medicine

## 2020-02-29 NOTE — Telephone Encounter (Signed)
Please refill as per office routine med refill policy (all routine meds refilled for 3 mo or monthly per pt preference up to one year from last visit, then month to month grace period for 3 mo, then further med refills will have to be denied)  

## 2020-04-28 ENCOUNTER — Other Ambulatory Visit: Payer: Self-pay | Admitting: Internal Medicine

## 2020-04-28 ENCOUNTER — Ambulatory Visit
Admission: RE | Admit: 2020-04-28 | Discharge: 2020-04-28 | Disposition: A | Discharge status: Home / Self Care | Payer: Medicare PPO | Source: Ambulatory Visit | Attending: Internal Medicine | Admitting: Internal Medicine

## 2020-04-28 DIAGNOSIS — R110 Nausea: Secondary | ICD-10-CM | POA: Diagnosis not present

## 2020-04-28 DIAGNOSIS — R10827 Generalized rebound abdominal tenderness: Secondary | ICD-10-CM | POA: Diagnosis not present

## 2020-04-28 DIAGNOSIS — Z90710 Acquired absence of both cervix and uterus: Secondary | ICD-10-CM | POA: Diagnosis not present

## 2020-04-28 DIAGNOSIS — Z9049 Acquired absence of other specified parts of digestive tract: Secondary | ICD-10-CM | POA: Diagnosis not present

## 2020-04-28 DIAGNOSIS — K5732 Diverticulitis of large intestine without perforation or abscess without bleeding: Secondary | ICD-10-CM | POA: Diagnosis not present

## 2020-04-28 DIAGNOSIS — R1084 Generalized abdominal pain: Secondary | ICD-10-CM | POA: Diagnosis not present

## 2020-04-28 DIAGNOSIS — I700 Atherosclerosis of aorta: Secondary | ICD-10-CM | POA: Diagnosis not present

## 2020-04-28 DIAGNOSIS — K5792 Diverticulitis of intestine, part unspecified, without perforation or abscess without bleeding: Secondary | ICD-10-CM | POA: Diagnosis not present

## 2020-04-28 MED ORDER — IOPAMIDOL (ISOVUE-300) INJECTION 61%
100.0000 mL | Freq: Once | INTRAVENOUS | Status: AC | PRN
  Administered 2020-04-28: 100 mL via INTRAVENOUS

## 2020-05-31 DIAGNOSIS — Z20822 Contact with and (suspected) exposure to covid-19: Secondary | ICD-10-CM | POA: Diagnosis not present

## 2020-05-31 DIAGNOSIS — Z8709 Personal history of other diseases of the respiratory system: Secondary | ICD-10-CM | POA: Diagnosis not present

## 2020-05-31 DIAGNOSIS — J40 Bronchitis, not specified as acute or chronic: Secondary | ICD-10-CM | POA: Diagnosis not present

## 2020-05-31 DIAGNOSIS — U071 COVID-19: Secondary | ICD-10-CM | POA: Diagnosis not present

## 2020-05-31 DIAGNOSIS — R059 Cough, unspecified: Secondary | ICD-10-CM | POA: Diagnosis not present

## 2020-05-31 DIAGNOSIS — R0981 Nasal congestion: Secondary | ICD-10-CM | POA: Diagnosis not present

## 2020-08-02 ENCOUNTER — Other Ambulatory Visit: Payer: Self-pay | Admitting: Internal Medicine

## 2020-08-26 IMAGING — DX DG CHEST 2V
2 series · 2 of 2 positions shown · non-contrast
Comparison: 11/17/2018

CLINICAL DATA: Persistent cough with low-grade fever getting worse.

EXAM:
CHEST - 2 VIEW

[chest pa]
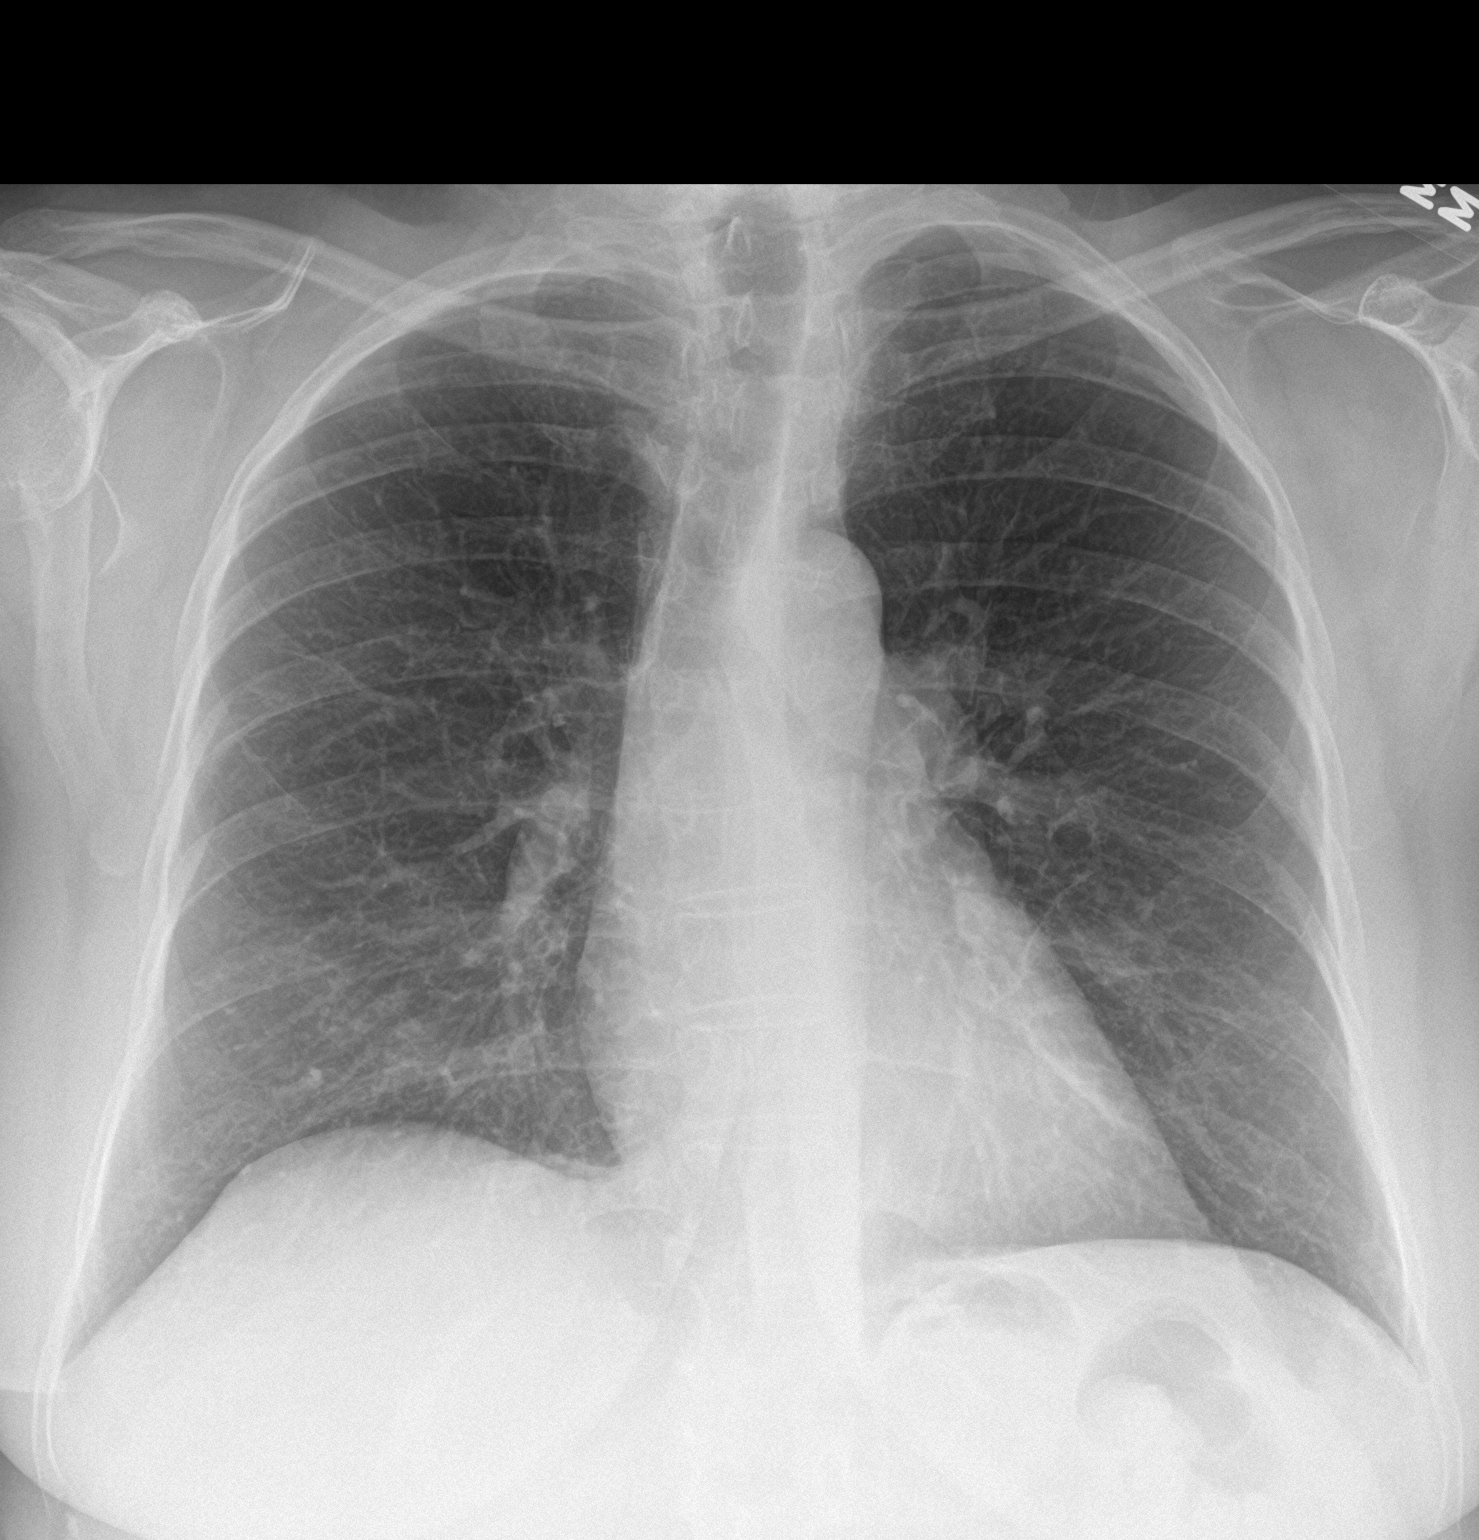

[chest lat]
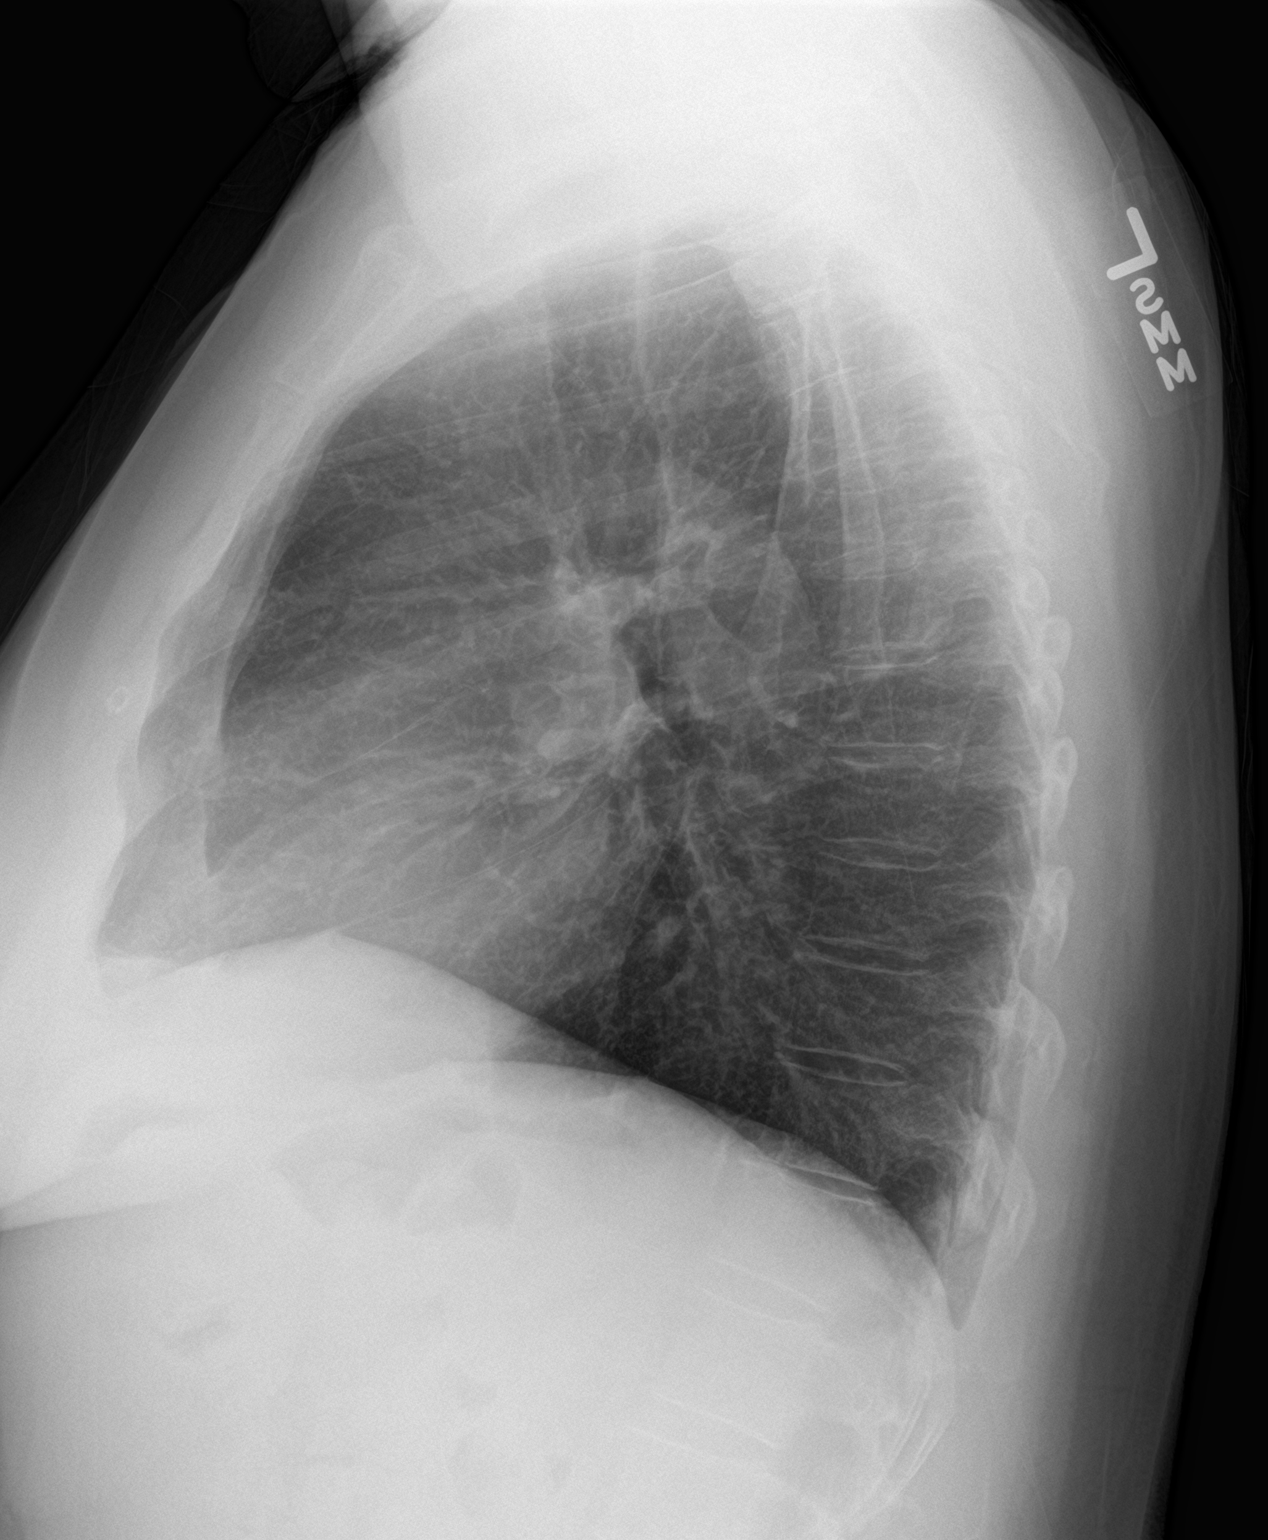

[2 of 2 positions shown; findings below may reference images not displayed]

FINDINGS: Lungs are adequately inflated without focal airspace consolidation
or effusion. Cardiomediastinal silhouette and remainder of the exam
is unchanged.
IMPRESSION: No active cardiopulmonary disease.

## 2020-09-28 ENCOUNTER — Other Ambulatory Visit: Payer: Self-pay | Admitting: Internal Medicine

## 2020-10-02 DIAGNOSIS — K219 Gastro-esophageal reflux disease without esophagitis: Secondary | ICD-10-CM | POA: Diagnosis not present

## 2020-10-02 DIAGNOSIS — R6889 Other general symptoms and signs: Secondary | ICD-10-CM | POA: Diagnosis not present

## 2020-10-02 DIAGNOSIS — F5101 Primary insomnia: Secondary | ICD-10-CM | POA: Diagnosis not present

## 2020-10-02 DIAGNOSIS — R5383 Other fatigue: Secondary | ICD-10-CM | POA: Diagnosis not present

## 2020-10-03 ENCOUNTER — Other Ambulatory Visit: Payer: Self-pay | Admitting: Internal Medicine

## 2020-11-13 DIAGNOSIS — Z8639 Personal history of other endocrine, nutritional and metabolic disease: Secondary | ICD-10-CM | POA: Diagnosis not present

## 2020-11-13 DIAGNOSIS — E7800 Pure hypercholesterolemia, unspecified: Secondary | ICD-10-CM | POA: Diagnosis not present

## 2020-11-20 DIAGNOSIS — Z0000 Encounter for general adult medical examination without abnormal findings: Secondary | ICD-10-CM | POA: Diagnosis not present

## 2020-11-20 DIAGNOSIS — E559 Vitamin D deficiency, unspecified: Secondary | ICD-10-CM | POA: Diagnosis not present

## 2020-11-20 DIAGNOSIS — E7800 Pure hypercholesterolemia, unspecified: Secondary | ICD-10-CM | POA: Diagnosis not present

## 2020-11-20 DIAGNOSIS — K219 Gastro-esophageal reflux disease without esophagitis: Secondary | ICD-10-CM | POA: Diagnosis not present

## 2020-11-20 DIAGNOSIS — Z8639 Personal history of other endocrine, nutritional and metabolic disease: Secondary | ICD-10-CM | POA: Diagnosis not present

## 2020-11-30 DIAGNOSIS — R202 Paresthesia of skin: Secondary | ICD-10-CM | POA: Diagnosis not present

## 2020-11-30 DIAGNOSIS — Z8739 Personal history of other diseases of the musculoskeletal system and connective tissue: Secondary | ICD-10-CM | POA: Diagnosis not present

## 2020-11-30 DIAGNOSIS — R0789 Other chest pain: Secondary | ICD-10-CM | POA: Diagnosis not present

## 2020-12-04 DIAGNOSIS — R109 Unspecified abdominal pain: Secondary | ICD-10-CM | POA: Diagnosis not present

## 2020-12-04 DIAGNOSIS — R300 Dysuria: Secondary | ICD-10-CM | POA: Diagnosis not present

## 2020-12-07 DIAGNOSIS — C4401 Basal cell carcinoma of skin of lip: Secondary | ICD-10-CM | POA: Diagnosis not present

## 2020-12-07 DIAGNOSIS — R0789 Other chest pain: Secondary | ICD-10-CM | POA: Diagnosis not present

## 2020-12-07 DIAGNOSIS — X32XXXS Exposure to sunlight, sequela: Secondary | ICD-10-CM | POA: Diagnosis not present

## 2020-12-07 DIAGNOSIS — L814 Other melanin hyperpigmentation: Secondary | ICD-10-CM | POA: Diagnosis not present

## 2020-12-07 DIAGNOSIS — D485 Neoplasm of uncertain behavior of skin: Secondary | ICD-10-CM | POA: Diagnosis not present

## 2020-12-07 DIAGNOSIS — R0600 Dyspnea, unspecified: Secondary | ICD-10-CM | POA: Diagnosis not present

## 2020-12-07 DIAGNOSIS — L821 Other seborrheic keratosis: Secondary | ICD-10-CM | POA: Diagnosis not present

## 2020-12-07 DIAGNOSIS — D1801 Hemangioma of skin and subcutaneous tissue: Secondary | ICD-10-CM | POA: Diagnosis not present

## 2020-12-11 DIAGNOSIS — Z1212 Encounter for screening for malignant neoplasm of rectum: Secondary | ICD-10-CM | POA: Diagnosis not present

## 2020-12-11 DIAGNOSIS — Z1211 Encounter for screening for malignant neoplasm of colon: Secondary | ICD-10-CM | POA: Diagnosis not present

## 2020-12-16 LAB — COLOGUARD: COLOGUARD: NEGATIVE

## 2021-01-25 DIAGNOSIS — L720 Epidermal cyst: Secondary | ICD-10-CM | POA: Diagnosis not present

## 2021-01-25 DIAGNOSIS — I781 Nevus, non-neoplastic: Secondary | ICD-10-CM | POA: Diagnosis not present

## 2021-01-25 DIAGNOSIS — C4401 Basal cell carcinoma of skin of lip: Secondary | ICD-10-CM | POA: Diagnosis not present

## 2021-02-19 ENCOUNTER — Other Ambulatory Visit: Payer: Self-pay

## 2021-02-20 ENCOUNTER — Ambulatory Visit: Payer: Medicare PPO | Admitting: Internal Medicine

## 2021-02-20 ENCOUNTER — Encounter: Payer: Self-pay | Admitting: Internal Medicine

## 2021-02-20 VITALS — BP 124/68 | HR 68 | Temp 98.5°F | Resp 18 | Ht 61.0 in | Wt 140.6 lb

## 2021-02-20 DIAGNOSIS — R739 Hyperglycemia, unspecified: Secondary | ICD-10-CM

## 2021-02-20 DIAGNOSIS — E7800 Pure hypercholesterolemia, unspecified: Secondary | ICD-10-CM

## 2021-02-20 DIAGNOSIS — I872 Venous insufficiency (chronic) (peripheral): Secondary | ICD-10-CM | POA: Diagnosis not present

## 2021-02-20 DIAGNOSIS — Z0001 Encounter for general adult medical examination with abnormal findings: Secondary | ICD-10-CM

## 2021-02-20 DIAGNOSIS — E538 Deficiency of other specified B group vitamins: Secondary | ICD-10-CM | POA: Diagnosis not present

## 2021-02-20 DIAGNOSIS — E2839 Other primary ovarian failure: Secondary | ICD-10-CM

## 2021-02-20 DIAGNOSIS — I10 Essential (primary) hypertension: Secondary | ICD-10-CM

## 2021-02-20 DIAGNOSIS — J029 Acute pharyngitis, unspecified: Secondary | ICD-10-CM

## 2021-02-20 DIAGNOSIS — E559 Vitamin D deficiency, unspecified: Secondary | ICD-10-CM | POA: Diagnosis not present

## 2021-02-20 LAB — URINALYSIS, ROUTINE W REFLEX MICROSCOPIC
Bilirubin Urine: NEGATIVE
Hgb urine dipstick: NEGATIVE
Ketones, ur: NEGATIVE
Leukocytes,Ua: NEGATIVE
Nitrite: NEGATIVE
RBC / HPF: NONE SEEN (ref 0–?)
Specific Gravity, Urine: 1.025 (ref 1.000–1.030)
Total Protein, Urine: NEGATIVE
Urine Glucose: NEGATIVE
Urobilinogen, UA: 0.2 (ref 0.0–1.0)
pH: 6 (ref 5.0–8.0)

## 2021-02-20 LAB — CBC WITH DIFFERENTIAL/PLATELET
Basophils Absolute: 0.1 10*3/uL (ref 0.0–0.1)
Basophils Relative: 0.8 % (ref 0.0–3.0)
Eosinophils Absolute: 0.3 10*3/uL (ref 0.0–0.7)
Eosinophils Relative: 3.6 % (ref 0.0–5.0)
HCT: 40.4 % (ref 36.0–46.0)
Hemoglobin: 13.4 g/dL (ref 12.0–15.0)
Lymphocytes Relative: 31 % (ref 12.0–46.0)
Lymphs Abs: 2.3 10*3/uL (ref 0.7–4.0)
MCHC: 33.2 g/dL (ref 30.0–36.0)
MCV: 87.8 fl (ref 78.0–100.0)
Monocytes Absolute: 0.5 10*3/uL (ref 0.1–1.0)
Monocytes Relative: 6.6 % (ref 3.0–12.0)
Neutro Abs: 4.4 10*3/uL (ref 1.4–7.7)
Neutrophils Relative %: 58 % (ref 43.0–77.0)
Platelets: 322 10*3/uL (ref 150.0–400.0)
RBC: 4.6 Mil/uL (ref 3.87–5.11)
RDW: 13.4 % (ref 11.5–15.5)
WBC: 7.5 10*3/uL (ref 4.0–10.5)

## 2021-02-20 LAB — LIPID PANEL
Cholesterol: 189 mg/dL (ref 0–200)
HDL: 66.8 mg/dL (ref >39.00)
NonHDL: 121.79
Total CHOL/HDL Ratio: 3
Triglycerides: 223 mg/dL — ABNORMAL HIGH (ref 0.0–149.0)
VLDL: 44.6 mg/dL — ABNORMAL HIGH (ref 0.0–40.0)

## 2021-02-20 LAB — HEPATIC FUNCTION PANEL
ALT: 15 U/L (ref 0–35)
AST: 16 U/L (ref 0–37)
Albumin: 4.3 g/dL (ref 3.5–5.2)
Alkaline Phosphatase: 98 U/L (ref 39–117)
Bilirubin, Direct: 0 mg/dL (ref 0.0–0.3)
Total Bilirubin: 0.3 mg/dL (ref 0.2–1.2)
Total Protein: 7.5 g/dL (ref 6.0–8.3)

## 2021-02-20 LAB — BASIC METABOLIC PANEL
BUN: 14 mg/dL (ref 6–23)
CO2: 29 mEq/L (ref 19–32)
Calcium: 9.7 mg/dL (ref 8.4–10.5)
Chloride: 103 mEq/L (ref 96–112)
Creatinine, Ser: 0.8 mg/dL (ref 0.40–1.20)
GFR: 75.16 mL/min (ref >60.00)
Glucose, Bld: 91 mg/dL (ref 70–99)
Potassium: 4.6 mEq/L (ref 3.5–5.1)
Sodium: 139 mEq/L (ref 135–145)

## 2021-02-20 LAB — TSH: TSH: 2.92 u[IU]/mL (ref 0.35–5.50)

## 2021-02-20 LAB — LDL CHOLESTEROL, DIRECT: Direct LDL: 103 mg/dL

## 2021-02-20 LAB — VITAMIN D 25 HYDROXY (VIT D DEFICIENCY, FRACTURES): VITD: 62.4 ng/mL (ref 30.00–100.00)

## 2021-02-20 LAB — HEMOGLOBIN A1C: Hgb A1c MFr Bld: 6 % (ref 4.6–6.5)

## 2021-02-20 LAB — VITAMIN B12: Vitamin B-12: 378 pg/mL (ref 211–911)

## 2021-02-20 MED ORDER — AZITHROMYCIN 250 MG PO TABS: ORAL_TABLET | ORAL | 0 refills | Status: AC

## 2021-02-20 NOTE — Progress Notes (Signed)
[name], female   DOB: 1951-02-08, 70 y.o.   MRN: [id number]         Chief Complaint:: wellness exam and Swelling and Pain to feet (Started over a month ago. When she walks she says it feels like water is sloshing around. )  And sore throat, htn, hld, hyperglycemia       HPI:  [name] is a 70 y.o. female here for wellness exam; due for mammogram (pt to call), DXA but declines covid booster, shingrix, flu shot and ow up to date with preventive referrals and immunizations                        Also c/o mild to mod ST with feeling poorly the last 3 days, fatigue, malaise without chills, cough, and Pt denies chest pain, increased sob or doe, wheezing, orthopnea, PND, palpitations, dizziness or syncope, but does have "feet sloshing" and intermittent mild pedal edema, worse at night, better in the A< for several months.   Pt denies polydipsia, polyuria, or new focal neuro s/s.   Pt denies high fever, wt loss, night sweats, loss of appetite, or other constitutional symptoms.     Wt Readings from Last 3 Encounters:  02/20/21 140 lb 9.6 oz (63.8 kg)  08/05/19 137 lb (62.1 kg)  08/04/18 144 lb (65.3 kg)   BP Readings from Last 3 Encounters:  02/20/21 124/68  08/05/19 111/70  02/10/19 131/84   Immunization History  Administered Date(s) Administered   Influenza-Unspecified 02/24/2014   Moderna Sars-Covid-2 Vaccination 07/24/2019, 08/21/2019   Pneumococcal Conjugate-13 10/09/2017   Td 05/27/2005   Tdap 06/12/2012   Zoster, Live 04/16/2012   Health Maintenance Due  Topic Date Due   DEXA SCAN  Never done      Past Medical History:  Diagnosis Date   ALLERGIC RHINITIS    [name]    ANXIETY    Arthritis    ASTHMA    Chronic headaches    COMMON MIGRAINE    DEPRESSION    Diabetes (HCC)    DIVERTICULOSIS, COLON    GERD (gastroesophageal reflux disease) 09/26/2015   HYPERLIPIDEMIA    [name]    Mild mitral regurgitation by prior  echocardiogram    Pneumonia    Past Surgical History:  Procedure Laterality Date   ABDOMINAL HYSTERECTOMY     APPENDECTOMY     CHOLECYSTECTOMY      reports that she quit smoking about 28 years ago. Her smoking use included cigarettes. She has a 20.00 pack-year smoking history. She has never used smokeless tobacco. She reports that she does not drink alcohol and does not use drugs. family history includes Bone cancer in her maternal aunt; Breast cancer in her maternal aunt; Cancer in her maternal uncle; Heart disease in her brother, father, mother, and paternal grandfather; Leukemia in her maternal grandmother; Other in her son. Allergies  Allergen Reactions   Nefazodone    Trazodone And Nefazodone    Current Outpatient Medications on File Prior to Visit  Medication Sig Dispense Refill   albuterol (VENTOLIN HFA) 108 (90 Base) MCG/ACT inhaler Inhale 2 puffs into the lungs every 6 (six) hours as needed for wheezing or shortness of breath. 18 g 1   amitriptyline (ELAVIL) 100 MG tablet Take 1 tablet (100 mg total) by mouth at bedtime. 90 tablet 1   amitriptyline (ELAVIL) 50 MG tablet TAKE 2 TABLETS AT BEDTIME. 180 tablet 0   aspirin  EC 81 MG tablet Take 1 tablet (81 mg total) by mouth daily. 90 tablet 11   atorvastatin (LIPITOR) 10 MG tablet TAKE 1 TABLET ONCE DAILY. 90 tablet 0   butalbital-acetaminophen-caffeine (FIORICET) 50-325-40 MG tablet TAKE 1 TABLET TWICE DAILY AS NEEDED FOR HEADACHE. 60 tablet 0   Calcium Carbonate-Vit D-Min (CALCIUM 1200 PO) Take 1 capsule by mouth daily.     diclofenac sodium (VOLTAREN) 1 % GEL Apply 4 g topically 4 (four) times daily as needed. 400 g 11   FABIOR 0.1 % FOAM apply TO FACE EVERY DAY AT BEDTIME over moisturizer  5   famotidine (PEPCID) 20 MG tablet One at bedtime 30 tablet 11   linaclotide (LINZESS) 145 MCG CAPS capsule Take 1 capsule by mouth daily. 30 capsule 4   meloxicam (MOBIC) 15 MG tablet Take 15 mg by mouth as needed.      Multiple  Vitamins-Minerals (ZINC PO) Take by mouth.     omeprazole (PRILOSEC) 20 MG capsule TAKE (1) CAPSULE DAILY. 90 capsule 0   tiZANidine (ZANAFLEX) 2 MG tablet Take 1 tablet (2 mg total) by mouth every 6 (six) hours as needed for muscle spasms. 60 tablet 1   Ubrogepant (UBRELVY) 50 MG TABS Take 50 mg by mouth as needed (may repeat once in 2 hours. no more than 2 pills in 24 h.). 10 tablet 3   VITAMIN D PO Take by mouth 2 (two) times daily.     No current facility-administered medications on file prior to visit.        ROS:  All others reviewed and negative.  Objective        PE:  BP 124/68   Pulse 68   Temp 98.5 F (36.9 C) (Oral)   Resp 18   Ht 5\' 1"  (1.549 m)   Wt 140 lb 9.6 oz (63.8 kg)   SpO2 97%   BMI 26.57 kg/m                 Constitutional: Pt appears in NAD               HENT: Head: NCAT.                Right Ear: External ear normal.                 Left Ear: External ear normal.  Bilat tm's with mild erythema.  Max sinus areas none tender.  Pharynx with mild erythema, no exudate               Eyes: . Pupils are equal, round, and reactive to light. Conjunctivae and EOM are normal               Nose: without d/c or deformity               Neck: Neck supple. Gross normal ROM               Cardiovascular: Normal rate and regular rhythm.                 Pulmonary/Chest: Effort normal and breath sounds without rales or wheezing.                Abd:  Soft, NT, ND, + BS, no organomegaly               Neurological: Pt is alert. At baseline orientation, motor grossly intact  Skin: Skin is warm. No rashes, no other new lesions, LE edema - none               Psychiatric: Pt behavior is normal without agitation   Micro: none  Cardiac tracings I have personally interpreted today:  none  Pertinent Radiological findings (summarize): none   Lab Results  Component Value Date   WBC 7.5 02/20/2021   HGB 13.4 02/20/2021   HCT 40.4 02/20/2021   PLT 322.0 02/20/2021    GLUCOSE 91 02/20/2021   CHOL 189 02/20/2021   TRIG 223.0 (H) 02/20/2021   HDL 66.80 02/20/2021   LDLDIRECT 103.0 02/20/2021   LDLCALC 74 10/09/2017   ALT 15 02/20/2021   AST 16 02/20/2021   NA 139 02/20/2021   K 4.6 02/20/2021   CL 103 02/20/2021   CREATININE 0.80 02/20/2021   BUN 14 02/20/2021   CO2 29 02/20/2021   TSH 2.92 02/20/2021   INR 1.0 05/10/2008   HGBA1C 6.0 02/20/2021   Assessment/Plan:  [name] is a 70 y.o. White or Caucasian [1] female with  has a past medical history of ALLERGIC RHINITIS, [name], ANXIETY, Arthritis, ASTHMA, Chronic headaches, COMMON MIGRAINE, DEPRESSION, Diabetes (HCC), DIVERTICULOSIS, COLON, GERD (gastroesophageal reflux disease) (09/26/2015), HYPERLIPIDEMIA, [name], Mild mitral regurgitation by prior echocardiogram, and Pneumonia.  Encounter for preventative adult health care exam with abnormal findings Age and sex appropriate education and counseling updated with regular exercise and diet Referrals for preventative services - for DXA Immunizations addressed - declines shignrix, covid booster, flu shot Smoking counseling  - none needed Evidence for depression or other mood disorder - none significant Most recent labs reviewed. I have personally reviewed and have noted: 1) the patient's medical and social history 2) The patient's current medications and supplements 3) The patient's height, weight, and BMI have been recorded in the chart   Hyperlipidemia Lab Results  Component Value Date   LDLCALC 74 10/09/2017   stable, pt to continue current statin lipitor   Essential hypertension BP Readings from Last 3 Encounters:  02/20/21 124/68  08/05/19 111/70  02/10/19 131/84   Stable, pt to continue medical treatment with low salt diet, wt control, excercise   Hyperglycemia Lab Results  Component Value Date   HGBA1C 6.0 02/20/2021   Stable, pt to continue current medical treatment  -  diet   Venous insufficiency Pt with no current edema this am but by hx with pedal edema later in the day, advised for leg elevation, low salt diet, wt control, excericse, consdier compression stockings during day  Estrogen deficiency For DXa as above  Pharyngitis Mild to mod, for antibx course,  to f/u any worsening symptoms or concerns  Followup: Return in about 6 months (around 08/20/2021).  Oliver Barre, MD 02/23/2021 6:06 AM [facility]

## 2021-02-20 NOTE — Patient Instructions (Addendum)
Please schedule the bone density test before leaving today at the scheduling desk (where you check out)  Please call for your yearly mammogram as you mentioned  Please take all new medication as prescribed - the antibiotic  Please continue all other medications as before, and refills have been done if requested.  Please have the pharmacy call with any other refills you may need.  Please continue your efforts at being more active, low cholesterol diet, and weight control.  You are otherwise up to date with prevention measures today.  Please keep your appointments with your specialists as you may have planned  Please go to the LAB at the blood drawing area for the tests to be done  You will be contacted by phone if any changes need to be made immediately.  Otherwise, you will receive a letter about your results with an explanation, but please check with MyChart first.  Please remember to sign up for MyChart if you have not done so, as this will be important to you in the future with finding out test results, communicating by private email, and scheduling acute appointments online when needed.  Please make an Appointment to return in 6 months, or sooner if needed

## 2021-02-21 DIAGNOSIS — Z20822 Contact with and (suspected) exposure to covid-19: Secondary | ICD-10-CM | POA: Diagnosis not present

## 2021-02-23 ENCOUNTER — Encounter: Payer: Self-pay | Admitting: Internal Medicine

## 2021-02-23 DIAGNOSIS — J029 Acute pharyngitis, unspecified: Secondary | ICD-10-CM | POA: Insufficient documentation

## 2021-02-23 DIAGNOSIS — I872 Venous insufficiency (chronic) (peripheral): Secondary | ICD-10-CM | POA: Insufficient documentation

## 2021-02-23 DIAGNOSIS — E2839 Other primary ovarian failure: Secondary | ICD-10-CM | POA: Insufficient documentation

## 2021-02-23 NOTE — Assessment & Plan Note (Signed)
Lab Results  ?Component Value Date  ? HGBA1C 6.0 02/20/2021  ? ?Stable, pt to continue current medical treatment  - diet ? ?

## 2021-02-23 NOTE — Assessment & Plan Note (Signed)
Lab Results  Component Value Date   LDLCALC 74 10/09/2017   stable, pt to continue current statin lipitor

## 2021-02-23 NOTE — Assessment & Plan Note (Signed)
Age and sex appropriate education and counseling updated with regular exercise and diet Referrals for preventative services - for DXA Immunizations addressed - declines shignrix, covid booster, flu shot Smoking counseling  - none needed Evidence for depression or other mood disorder - none significant Most recent labs reviewed. I have personally reviewed and have noted: 1) the patient's medical and social history 2) The patient's current medications and supplements 3) The patient's height, weight, and BMI have been recorded in the chart

## 2021-02-23 NOTE — Assessment & Plan Note (Signed)
BP Readings from Last 3 Encounters:  02/20/21 124/68  08/05/19 111/70  02/10/19 131/84   Stable, pt to continue medical treatment with low salt diet, wt control, excercise

## 2021-02-23 NOTE — Assessment & Plan Note (Signed)
For DXa as above

## 2021-02-23 NOTE — Assessment & Plan Note (Signed)
Mild to mod, for antibx course,  to f/u any worsening symptoms or concerns 

## 2021-02-23 NOTE — Assessment & Plan Note (Addendum)
Pt with no current edema this am but by hx with pedal edema later in the day, advised for leg elevation, low salt diet, wt control, excericse, consdier compression stockings during day

## 2021-03-01 ENCOUNTER — Other Ambulatory Visit: Payer: Self-pay | Admitting: Internal Medicine

## 2021-03-20 ENCOUNTER — Other Ambulatory Visit: Payer: Self-pay | Admitting: Internal Medicine

## 2021-04-04 DIAGNOSIS — N3080 Other cystitis without hematuria: Secondary | ICD-10-CM | POA: Diagnosis not present

## 2021-04-04 DIAGNOSIS — R350 Frequency of micturition: Secondary | ICD-10-CM | POA: Diagnosis not present

## 2021-04-04 DIAGNOSIS — R300 Dysuria: Secondary | ICD-10-CM | POA: Diagnosis not present

## 2021-04-23 ENCOUNTER — Other Ambulatory Visit: Payer: Self-pay

## 2021-04-23 ENCOUNTER — Telehealth: Payer: Medicare PPO | Admitting: Family Medicine

## 2021-04-23 ENCOUNTER — Telehealth (INDEPENDENT_AMBULATORY_CARE_PROVIDER_SITE_OTHER): Payer: Medicare PPO | Admitting: Nurse Practitioner

## 2021-04-23 ENCOUNTER — Other Ambulatory Visit: Payer: Medicare PPO

## 2021-04-23 ENCOUNTER — Encounter: Payer: Self-pay | Admitting: Nurse Practitioner

## 2021-04-23 ENCOUNTER — Other Ambulatory Visit: Payer: Self-pay | Admitting: Nurse Practitioner

## 2021-04-23 VITALS — Temp 98.1°F

## 2021-04-23 DIAGNOSIS — R051 Acute cough: Secondary | ICD-10-CM

## 2021-04-23 DIAGNOSIS — G4489 Other headache syndrome: Secondary | ICD-10-CM | POA: Diagnosis not present

## 2021-04-23 DIAGNOSIS — J40 Bronchitis, not specified as acute or chronic: Secondary | ICD-10-CM

## 2021-04-23 DIAGNOSIS — J029 Acute pharyngitis, unspecified: Secondary | ICD-10-CM | POA: Diagnosis not present

## 2021-04-23 DIAGNOSIS — R509 Fever, unspecified: Secondary | ICD-10-CM

## 2021-04-23 LAB — POCT INFLUENZA A/B
Influenza A, POC: NEGATIVE
Influenza B, POC: NEGATIVE

## 2021-04-23 LAB — POC COVID19 BINAXNOW: SARS Coronavirus 2 Ag: NEGATIVE

## 2021-04-23 MED ORDER — DOXYCYCLINE HYCLATE 100 MG PO TABS: 100.0000 mg | ORAL_TABLET | Freq: Two times a day (BID) | ORAL | 0 refills | Status: DC

## 2021-04-23 MED ORDER — GUAIFENESIN-CODEINE 100-10 MG/5ML PO SOLN: 5.0000 mL | Freq: Three times a day (TID) | ORAL | 0 refills | Status: AC | PRN

## 2021-04-23 NOTE — Progress Notes (Signed)
[name], female    DOB: [birth year], 70 y.o.   MRN: [id number]  Virtual visit completed through [employer], a video enabled telemedicine application. Due to national recommendations of social distancing due to COVID-19, a virtual visit is felt to be most appropriate for this patient at this time. Reviewed limitations, risks, security and privacy concerns of performing a virtual visit and the availability of in person appointments. I also reviewed that there may be a patient responsible charge related to this service. The patient agreed to proceed.   Patient location: home Provider location: [place] at [facility], office Persons participating in this virtual visit: patient, provider   If any vitals were documented, they were collected by patient at home unless specified below.    Temp 98.1 F (36.7 C) Comment: per patient   CC: Cough Subjective:   HPI: [name] is a 70 y.o. female presenting on 04/23/2021 for Cough (Sx started around 04/16/21-Sore throat, mainly dry cough, headache, temp 99.6, chills off and on. Has been taking Allegra. Did not test for Covid.)  Symptoms 04/16/2021 Covid test have not tested Moderna vaccinated x2 Has been around her son who had a sore throat OTC treatment: Allegra with some help Symptoms have gotten worse     Relevant past medical, surgical, family and social history reviewed and updated as indicated. Interim medical history since our last visit reviewed. Allergies and medications reviewed and updated. Outpatient Medications Prior to Visit  Medication Sig Dispense Refill   amitriptyline (ELAVIL) 50 MG tablet TAKE 2 TABLETS AT BEDTIME. 180 tablet 1   aspirin EC 81 MG tablet Take 1 tablet (81 mg total) by mouth daily. 90 tablet 11   atorvastatin (LIPITOR) 10 MG tablet TAKE 1 TABLET ONCE DAILY. 90 tablet 0   butalbital-acetaminophen-caffeine (FIORICET) 50-325-40 MG tablet TAKE ONE TABLET BY MOUTH TWICE DAILY AS NEEDED FOR  HEADACHE 60 tablet 0   Calcium Carbonate-Vit D-Min (CALCIUM 1200 PO) Take 1 capsule by mouth daily.     Lactobacillus-Inulin (CULTURELLE DIGESTIVE DAILY PO) Take by mouth.     Misc Natural Products (ELDERBERRY ZINC/VIT C/IMMUNE MT) Use as directed in the mouth or throat.     omeprazole (PRILOSEC) 20 MG capsule TAKE (1) CAPSULE DAILY. 90 capsule 0   VITAMIN D PO Take by mouth 2 (two) times daily.     albuterol (VENTOLIN HFA) 108 (90 Base) MCG/ACT inhaler Inhale 2 puffs into the lungs every 6 (six) hours as needed for wheezing or shortness of breath. 18 g 1   amitriptyline (ELAVIL) 100 MG tablet Take 1 tablet (100 mg total) by mouth at bedtime. 90 tablet 1   diclofenac sodium (VOLTAREN) 1 % GEL Apply 4 g topically 4 (four) times daily as needed. 400 g 11   FABIOR 0.1 % FOAM apply TO FACE EVERY DAY AT BEDTIME over moisturizer  5   famotidine (PEPCID) 20 MG tablet One at bedtime 30 tablet 11   linaclotide (LINZESS) 145 MCG CAPS capsule Take 1 capsule by mouth daily. 30 capsule 4   meloxicam (MOBIC) 15 MG tablet Take 15 mg by mouth as needed.      Multiple Vitamins-Minerals (ZINC PO) Take by mouth.     tiZANidine (ZANAFLEX) 2 MG tablet Take 1 tablet (2 mg total) by mouth every 6 (six) hours as needed for muscle spasms. 60 tablet 1   Ubrogepant (UBRELVY) 50 MG TABS Take 50 mg by mouth as needed (may repeat once in 2 hours. no  more than 2 pills in 24 h.). 10 tablet 3   No facility-administered medications prior to visit.     Per HPI unless specifically indicated in ROS section below Review of Systems  Constitutional:  Positive for appetite change, chills, fatigue and fever.  HENT:  Positive for congestion, sinus pressure and sore throat. Negative for ear discharge, ear pain and postnasal drip.   Respiratory:  Positive for cough (was able to yellow and green. No longer able to get it up) and shortness of breath (DOE).   Cardiovascular:  Positive for chest pain.  Gastrointestinal:  Negative for  abdominal pain, diarrhea, nausea and vomiting.  Musculoskeletal:  Negative for arthralgias and myalgias.  Neurological:  Positive for headaches.  Objective:  Temp 98.1 F (36.7 C) Comment: per patient  Wt Readings from Last 3 Encounters:  02/20/21 140 lb 9.6 oz (63.8 kg)  08/05/19 137 lb (62.1 kg)  08/04/18 144 lb (65.3 kg)       Physical exam: Gen: alert, NAD, not ill appearing Pulm: speaks in complete sentences without increased work of breathing Psych: normal mood, normal thought content      Results for orders placed or performed in visit on 02/20/21  Vitamin B12  Result Value Ref Range   Vitamin B-12 378 211 - 911 pg/mL  VITAMIN D 25 Hydroxy (Vit-D Deficiency, Fractures)  Result Value Ref Range   VITD 62.40 30.00 - 100.00 ng/mL  Basic metabolic panel  Result Value Ref Range   Sodium 139 135 - 145 mEq/L   Potassium 4.6 3.5 - 5.1 mEq/L   Chloride 103 96 - 112 mEq/L   CO2 29 19 - 32 mEq/L   Glucose, Bld 91 70 - 99 mg/dL   BUN 14 6 - 23 mg/dL   Creatinine, Ser 2.22 0.40 - 1.20 mg/dL   GFR 97.98 >92.11 mL/min   Calcium 9.7 8.4 - 10.5 mg/dL  Urinalysis, Routine w reflex microscopic  Result Value Ref Range   Color, Urine YELLOW Yellow;Lt. Yellow;Straw;Dark Yellow;Amber;Green;Red;Brown   APPearance CLEAR Clear;Turbid;Slightly Cloudy;Cloudy   Specific Gravity, Urine 1.025 1.000 - 1.030   pH 6.0 5.0 - 8.0   Total Protein, Urine NEGATIVE Negative   Urine Glucose NEGATIVE Negative   Ketones, ur NEGATIVE Negative   Bilirubin Urine NEGATIVE Negative   Hgb urine dipstick NEGATIVE Negative   Urobilinogen, UA 0.2 0.0 - 1.0   Leukocytes,Ua NEGATIVE Negative   Nitrite NEGATIVE Negative   WBC, UA 0-2/hpf 0-2/hpf   RBC / HPF none seen 0-2/hpf   Squamous Epithelial / LPF Rare(0-4/hpf) Rare(0-4/hpf)  TSH  Result Value Ref Range   TSH 2.92 0.35 - 5.50 uIU/mL  CBC with Differential/Platelet  Result Value Ref Range   WBC 7.5 4.0 - 10.5 K/uL   RBC 4.60 3.87 - 5.11 Mil/uL    Hemoglobin 13.4 12.0 - 15.0 g/dL   HCT 94.1 74.0 - 81.4 %   MCV 87.8 78.0 - 100.0 fl   MCHC 33.2 30.0 - 36.0 g/dL   RDW 48.1 85.6 - 31.4 %   Platelets 322.0 150.0 - 400.0 K/uL   Neutrophils Relative % 58.0 43.0 - 77.0 %   Lymphocytes Relative 31.0 12.0 - 46.0 %   Monocytes Relative 6.6 3.0 - 12.0 %   Eosinophils Relative 3.6 0.0 - 5.0 %   Basophils Relative 0.8 0.0 - 3.0 %   Neutro Abs 4.4 1.4 - 7.7 K/uL   Lymphs Abs 2.3 0.7 - 4.0 K/uL   Monocytes Absolute 0.5 0.1 - 1.0  K/uL   Eosinophils Absolute 0.3 0.0 - 0.7 K/uL   Basophils Absolute 0.1 0.0 - 0.1 K/uL  Hepatic function panel  Result Value Ref Range   Total Bilirubin 0.3 0.2 - 1.2 mg/dL   Bilirubin, Direct 0.0 0.0 - 0.3 mg/dL   Alkaline Phosphatase 98 39 - 117 U/L   AST 16 0 - 37 U/L   ALT 15 0 - 35 U/L   Total Protein 7.5 6.0 - 8.3 g/dL   Albumin 4.3 3.5 - 5.2 g/dL  Lipid panel  Result Value Ref Range   Cholesterol 189 0 - 200 mg/dL   Triglycerides 979.8 (H) 0.0 - 149.0 mg/dL   HDL 92.11 >94.17 mg/dL   VLDL 40.8 (H) 0.0 - 14.4 mg/dL   Total CHOL/HDL Ratio 3    NonHDL 121.79   Hemoglobin A1c  Result Value Ref Range   Hgb A1c MFr Bld 6.0 4.6 - 6.5 %  LDL cholesterol, direct  Result Value Ref Range   Direct LDL 103.0 mg/dL   Assessment & Plan:   Problem List Items Addressed This Visit       Other   Cough - Primary   Relevant Medications   guaiFENesin-codeine 100-10 MG/5ML syrup   Other Relevant Orders   Influenza A/B   POC COVID-19   Headache    History of migraine headaches that she is currently medicated for having some headaches with illness.  Pending testing continue prescribed medications, continue to monitor.      Relevant Orders   Influenza A/B   POC COVID-19   Sore throat    Comment testing pending results of negative likely upper respiratory infection we will treat with antibiotics.  Discussed this with patient continue to monitor      Relevant Orders   Influenza A/B   POC COVID-19   Fever  and chills    Pending flu and COVID test.      Relevant Orders   Influenza A/B   POC COVID-19     No orders of the defined types were placed in this encounter.  No orders of the defined types were placed in this encounter.   I discussed the assessment and treatment plan with the patient. The patient was provided an opportunity to ask questions and all were answered. The patient agreed with the plan and demonstrated an understanding of the instructions. The patient was advised to call back or seek an in-person evaluation if the symptoms worsen or if the condition fails to improve as anticipated.  Follow up plan: No follow-ups on file.  Audria Nine, NP

## 2021-04-23 NOTE — Assessment & Plan Note (Signed)
Comment testing pending results of negative likely upper respiratory infection we will treat with antibiotics.  Discussed this with patient continue to monitor

## 2021-04-23 NOTE — Assessment & Plan Note (Signed)
Pending flu and COVID test. 

## 2021-04-23 NOTE — Assessment & Plan Note (Signed)
History of migraine headaches that she is currently medicated for having some headaches with illness.  Pending testing continue prescribed medications, continue to monitor.

## 2021-04-25 ENCOUNTER — Encounter: Payer: Self-pay | Admitting: Internal Medicine

## 2021-04-25 ENCOUNTER — Telehealth: Payer: Self-pay | Admitting: Internal Medicine

## 2021-04-25 ENCOUNTER — Ambulatory Visit: Payer: Medicare PPO | Admitting: Internal Medicine

## 2021-04-25 ENCOUNTER — Other Ambulatory Visit: Payer: Self-pay

## 2021-04-25 DIAGNOSIS — J069 Acute upper respiratory infection, unspecified: Secondary | ICD-10-CM

## 2021-04-25 NOTE — Progress Notes (Signed)
   Subjective:   [name] Self, female    DOB: [birth year], 70 y.o.   MRN: [id number]  HPI The patient is a 70 YO female coming in for cough and cold symptoms. Started about 7-8 days ago. Having cough still and mild SOB on exertion. Seen virtually and did covid-19 and flu testing which were negative. She was given rx for doxycycline which she started 1-2 days ago.   Review of Systems  Constitutional:  Positive for activity change and appetite change. Negative for chills, fatigue, fever and unexpected weight change.  HENT:  Positive for congestion and sinus pressure. Negative for ear discharge, ear pain, postnasal drip, rhinorrhea, sinus pain, sneezing, sore throat, tinnitus, trouble swallowing and voice change.   Eyes: Negative.   Respiratory:  Positive for cough and shortness of breath. Negative for chest tightness and wheezing.   Cardiovascular: Negative.   Gastrointestinal: Negative.   Musculoskeletal:  Positive for myalgias.  Neurological: Negative.    Objective:  Physical Exam Constitutional:      Appearance: She is well-developed.  HENT:     Head: Normocephalic and atraumatic.     Comments: Oropharynx with redness and clear drainage, nose with swollen turbinates, TMs normal bilaterally.  Neck:     Thyroid: No thyromegaly.  Cardiovascular:     Rate and Rhythm: Normal rate and regular rhythm.  Pulmonary:     Effort: Pulmonary effort is normal. No respiratory distress.     Breath sounds: Rhonchi present. No wheezing or rales.     Comments: Mild rhonchi left lung Abdominal:     Palpations: Abdomen is soft.  Musculoskeletal:        General: No tenderness.     Cervical back: Normal range of motion.  Lymphadenopathy:     Cervical: No cervical adenopathy.  Skin:    General: Skin is warm and dry.  Neurological:     Mental Status: She is alert and oriented to person, place, and time.    Vitals:   04/25/21 1005  BP: 122/78  Pulse: 90  Resp: 18  Temp: 98.2 F  (36.8 C)  TempSrc: Oral  SpO2: 96%  Weight: 141 lb 3.2 oz (64 kg)  Height: 5\' 1"  (1.549 m)    This visit occurred during the SARS-CoV-2 public health emergency.  Safety protocols were in place, including screening questions prior to the visit, additional usage of staff PPE, and extensive cleaning of exam room while observing appropriate contact time as indicated for disinfecting solutions.   Assessment & Plan:

## 2021-04-25 NOTE — Telephone Encounter (Signed)
[facility] 04/24/2021.[name] states she is just getting over cough, congestion, and sore throat x 3days; states today her pulse ox reading is 90-94%. States she feels as though the respiratory s/s are in her chest. States she feels SOB on exertion.  Advised to see PCP within 24 hrs. Caller understood and agreed.   Additional comments:  Pt describes wheezing upon expiration. Pt started today on doxycycline.

## 2021-04-25 NOTE — Patient Instructions (Signed)
Keep taking the antibiotic and using the cough medicine if needed.

## 2021-04-26 ENCOUNTER — Encounter: Payer: Self-pay | Admitting: Internal Medicine

## 2021-04-26 NOTE — Assessment & Plan Note (Signed)
She has sick family members she was exposed to prior to onset of symptoms. Suspect flu with symptoms and prevalence in the community. Testing done about 1 week after onset of symptoms which was negative likely due to timeframe of testing. She is outside the window for any treatment regardless if this was flu or covid or other viral illness. Okay to use cough medicine and okay to continue doxycycline. Advised of timeline for recovery usually 1-2 weeks and to call back for worsening or lack of improvement in 3-4 days.

## 2021-04-30 ENCOUNTER — Telehealth: Payer: Self-pay

## 2021-04-30 NOTE — Telephone Encounter (Signed)
Received fax from [facility] for refill request for cough syrup. I called patient to follow up. Patient ended up having an office visit with Dr Okey Dupre on 04/25/21 due to still coughing, SOB and wheezing feeling. Patient has one more dose of Doxy left to take. She has finished cough syrup and asked for refill, this does help to calm things down with her cough but is not resolving. Still has chest pain-as she did during recent OV, SOB and wheezing feeling, cough is better and not as frequent but when she does cough the episode last a while it seems. Overall maybe 40% better. Patient is taking Allegra still daily, feels some nasal congestion still present. The rest of the symptoms subsided, no fevers.  Patient does have an office visit with her PCP Dr Jonny Ruiz on 05/04/21 to follow up on her sx again and due to not feeling all the way better. Ok to refill cough medication? Any other recommendations?

## 2021-04-30 NOTE — Telephone Encounter (Signed)
Patient advised. Sending note to Dr Okey Dupre for review.

## 2021-04-30 NOTE — Telephone Encounter (Signed)
If she was seen after our visit in person by another provider we can see if they will refill the cough medication since they saw her last

## 2021-05-01 MED ORDER — HYDROCODONE BIT-HOMATROP MBR 5-1.5 MG/5ML PO SOLN: 5.0000 mL | Freq: Four times a day (QID) | ORAL | 0 refills | Status: DC | PRN

## 2021-05-01 NOTE — Telephone Encounter (Signed)
Patient advised.

## 2021-05-01 NOTE — Telephone Encounter (Signed)
I would recommend that since she is 2 weeks into symptoms she try to transition to over the counter delsym for cough.

## 2021-05-01 NOTE — Telephone Encounter (Signed)
Ok  - hycodan done erx 

## 2021-05-01 NOTE — Addendum Note (Signed)
Addended by: Corwin Levins on: 05/01/2021 12:02 PM   Modules accepted: Orders

## 2021-05-01 NOTE — Telephone Encounter (Signed)
Patient advised. Patient states she has tried Delsym and other OTC cough medications and these did not help settle her cough spells. She has tried Benzonatate without relief also.

## 2021-05-04 ENCOUNTER — Encounter: Payer: Self-pay | Admitting: Internal Medicine

## 2021-05-04 ENCOUNTER — Ambulatory Visit: Payer: Medicare PPO | Admitting: Internal Medicine

## 2021-05-04 ENCOUNTER — Other Ambulatory Visit: Payer: Self-pay

## 2021-05-04 VITALS — BP 112/60 | HR 83 | Temp 98.3°F | Ht 61.0 in | Wt 141.2 lb

## 2021-05-04 DIAGNOSIS — R051 Acute cough: Secondary | ICD-10-CM

## 2021-05-04 DIAGNOSIS — F411 Generalized anxiety disorder: Secondary | ICD-10-CM

## 2021-05-04 DIAGNOSIS — R062 Wheezing: Secondary | ICD-10-CM | POA: Diagnosis not present

## 2021-05-04 MED ORDER — PREDNISONE 10 MG PO TABS: ORAL_TABLET | ORAL | 0 refills | Status: DC

## 2021-05-04 MED ORDER — HYDROCODONE BIT-HOMATROP MBR 5-1.5 MG/5ML PO SOLN: 5.0000 mL | Freq: Four times a day (QID) | ORAL | 0 refills | Status: AC | PRN

## 2021-05-04 MED ORDER — AZITHROMYCIN 250 MG PO TABS: ORAL_TABLET | ORAL | 1 refills | Status: AC

## 2021-05-04 NOTE — Progress Notes (Signed)
[name], female   DOB: 1951-05-02, 70 y.o.   MRN: [id number]        Chief Complaint: follow up cough, wheezing, anxiety       HPI:  [name] is a 70 y.o. female Here with acute onset mild to mod 2-3 days ST, HA, general weakness and malaise, with prod cough greenish sputum, but Pt denies chest pain, increased sob or doe, wheezing, orthopnea, PND, increased LE swelling, palpitations, dizziness or syncope, except for mild sob and wheezing since last pm.  Denies worsening depressive symptoms, suicidal ideation, or panic;  Pt denies recent wt loss, night sweats, loss of appetite, or other constitutional symptoms  No other new complaints       Wt Readings from Last 3 Encounters:  05/04/21 141 lb 3.2 oz (64 kg)  04/25/21 141 lb 3.2 oz (64 kg)  02/20/21 140 lb 9.6 oz (63.8 kg)   BP Readings from Last 3 Encounters:  05/04/21 112/60  04/25/21 122/78  02/20/21 124/68         Past Medical History:  Diagnosis Date   ALLERGIC RHINITIS    [name]    ANXIETY    Arthritis    ASTHMA    Chronic headaches    COMMON MIGRAINE    DEPRESSION    Diabetes (HCC)    DIVERTICULOSIS, COLON    GERD (gastroesophageal reflux disease) 09/26/2015   HYPERLIPIDEMIA    [name]    Mild mitral regurgitation by prior echocardiogram    Pneumonia    Past Surgical History:  Procedure Laterality Date   ABDOMINAL HYSTERECTOMY     APPENDECTOMY     CHOLECYSTECTOMY      reports that she quit smoking about 28 years ago. Her smoking use included cigarettes. She has a 20.00 pack-year smoking history. She has never used smokeless tobacco. She reports that she does not drink alcohol and does not use drugs. family history includes Bone cancer in her maternal aunt; Breast cancer in her maternal aunt; Cancer in her maternal uncle; Heart disease in her brother, father, mother, and paternal grandfather; Leukemia in her maternal grandmother; Other in her son. Allergies   Allergen Reactions   Nefazodone    Trazodone And Nefazodone    Current Outpatient Medications on File Prior to Visit  Medication Sig Dispense Refill   amitriptyline (ELAVIL) 50 MG tablet TAKE 2 TABLETS AT BEDTIME. 180 tablet 1   aspirin EC 81 MG tablet Take 1 tablet (81 mg total) by mouth daily. 90 tablet 11   atorvastatin (LIPITOR) 10 MG tablet TAKE 1 TABLET ONCE DAILY. 90 tablet 0   butalbital-acetaminophen-caffeine (FIORICET) 50-325-40 MG tablet TAKE ONE TABLET BY MOUTH TWICE DAILY AS NEEDED FOR HEADACHE 60 tablet 0   Calcium Carbonate-Vit D-Min (CALCIUM 1200 PO) Take 1 capsule by mouth daily.     Lactobacillus-Inulin (CULTURELLE DIGESTIVE DAILY PO) Take by mouth.     Misc Natural Products (ELDERBERRY ZINC/VIT C/IMMUNE MT) Use as directed in the mouth or throat.     omeprazole (PRILOSEC) 20 MG capsule TAKE (1) CAPSULE DAILY. 90 capsule 0   VITAMIN D PO Take by mouth 2 (two) times daily.     No current facility-administered medications on file prior to visit.        ROS:  All others reviewed and negative.  Objective        PE:  BP 112/60 (BP Location: Left Arm, Patient Position: Sitting, Cuff Size: Normal)   Pulse 83  Temp 98.3 F (36.8 C) (Oral)   Ht 5\' 1"  (1.549 m)   Wt 141 lb 3.2 oz (64 kg)   SpO2 99%   BMI 26.68 kg/m                 Constitutional: Pt appears in NAD               HENT: Head: NCAT.                Right Ear: External ear normal.                 Left Ear: External ear normal.                Eyes: . Pupils are equal, round, and reactive to light. Conjunctivae and EOM are normal               Nose: without d/c or deformity               Neck: Neck supple. Gross normal ROM               Cardiovascular: Normal rate and regular rhythm.                 Pulmonary/Chest: Effort normal and breath sounds without rales or wheezing.                Abd:  Soft, NT, ND, + BS, no organomegaly               Neurological: Pt is alert. At baseline orientation, motor  grossly intact               Skin: Skin is warm. No rashes, no other new lesions, LE edema - none               Psychiatric: Pt behavior is normal without agitation   Micro: none  Cardiac tracings I have personally interpreted today:  none  Pertinent Radiological findings (summarize): none   Lab Results  Component Value Date   WBC 7.5 02/20/2021   HGB 13.4 02/20/2021   HCT 40.4 02/20/2021   PLT 322.0 02/20/2021   GLUCOSE 91 02/20/2021   CHOL 189 02/20/2021   TRIG 223.0 (H) 02/20/2021   HDL 66.80 02/20/2021   LDLDIRECT 103.0 02/20/2021   LDLCALC 74 10/09/2017   ALT 15 02/20/2021   AST 16 02/20/2021   NA 139 02/20/2021   K 4.6 02/20/2021   CL 103 02/20/2021   CREATININE 0.80 02/20/2021   BUN 14 02/20/2021   CO2 29 02/20/2021   TSH 2.92 02/20/2021   INR 1.0 05/10/2008   HGBA1C 6.0 02/20/2021   Assessment/Plan:  [name] is a 70 y.o. White or Caucasian [1] female with  has a past medical history of ALLERGIC RHINITIS, [name], ANXIETY, Arthritis, ASTHMA, Chronic headaches, COMMON MIGRAINE, DEPRESSION, Diabetes (HCC), DIVERTICULOSIS, COLON, GERD (gastroesophageal reflux disease) (09/26/2015), HYPERLIPIDEMIA, [name], Mild mitral regurgitation by prior echocardiogram, and Pneumonia.  Anxiety state Stable overall, to continue curent med tx,  to f/u any worsening symptoms or concerns   Cough Mild to mod, c/w bornchitis vs pna, decliens cxr, for antibx course, cough med prn,  to f/u any worsening symptoms or concerns  Wheezing Mild to mod, for predpac asd,,inhaler prn,,  to f/u any worsening symptoms or concerns  Followup: Return if symptoms worsen or fail to improve.  11/26/2015, MD 05/05/2021 9:54 PM [facility] - 14/02/2021  [facility]

## 2021-05-04 NOTE — Patient Instructions (Signed)
Please take all new medication as prescribed  - the antibiotic, cough medicine, prednisone  Please continue all other medications as before  Please have the pharmacy call with any other refills you may need.  Please continue your efforts at being more active, low cholesterol diet, and weight control.  Please keep your appointments with your specialists as you may have planned

## 2021-05-05 ENCOUNTER — Encounter: Payer: Self-pay | Admitting: Internal Medicine

## 2021-05-05 NOTE — Assessment & Plan Note (Signed)
Mild to mod, c/w bornchitis vs pna, decliens cxr, for antibx course, cough med prn,  to f/u any worsening symptoms or concerns

## 2021-05-05 NOTE — Assessment & Plan Note (Signed)
Mild to mod, for predpac asd,,inhaler prn,,  to f/u any worsening symptoms or concerns

## 2021-05-05 NOTE — Assessment & Plan Note (Signed)
Stable overall, to continue curent med tx,  to f/u any worsening symptoms or concerns

## 2021-05-15 ENCOUNTER — Encounter: Payer: Self-pay | Admitting: Family Medicine

## 2021-05-15 ENCOUNTER — Telehealth (INDEPENDENT_AMBULATORY_CARE_PROVIDER_SITE_OTHER): Payer: Medicare PPO | Admitting: Family Medicine

## 2021-05-15 VITALS — HR 81 | Wt 138.0 lb

## 2021-05-15 DIAGNOSIS — J029 Acute pharyngitis, unspecified: Secondary | ICD-10-CM

## 2021-05-15 DIAGNOSIS — R059 Cough, unspecified: Secondary | ICD-10-CM

## 2021-05-15 DIAGNOSIS — R0981 Nasal congestion: Secondary | ICD-10-CM | POA: Diagnosis not present

## 2021-05-15 MED ORDER — BENZONATATE 100 MG PO CAPS: ORAL_CAPSULE | ORAL | 0 refills | Status: DC

## 2021-05-15 NOTE — Patient Instructions (Addendum)
°  HOME CARE TIPS:  -COVID19 testing information: [email or web address].[email or web address]  Most pharmacies also offer testing and home test kits. If the Covid19 test is positive and you desire antiviral treatment, please contact a [facility] pharmacy or schedule a follow up virtual visit through your primary care office or through the [employer].  Other test to treat options: http://www.vasquez-vaughn.[email or web address]_source=alert  -I sent the medication(s) we discussed to your pharmacy: Meds ordered this encounter  Medications   benzonatate (TESSALON PERLES) 100 MG capsule    Sig: 1-2 capsules up to twice daily as needed for cough    Dispense:  30 capsule    Refill:  0    -can use tylenol if needed for fevers, aches and pains per instructions  -can use nasal saline a few times per day if you have nasal congestion  -a humidifier (clean properly) or steam can help  -gargling warm salt water can help the sore throat  -stay hydrated, drink plenty of fluids and eat small healthy meals - avoid dairy  -can take 1000 IU ( ) Vit D3 and 100-500 mg of Vit C daily per instructions  -If the Covid test is positive, check out the [facility] website for more information on home care, transmission and treatment for COVID19  -follow up with your doctor in 2-3 days unless improving and feeling better  -stay home while sick, except to seek medical care. If you have COVID19, you will likely be contagious for 7-10 days. Flu or Influenza is likely contagious for about 7 days. Other respiratory viral infections remain contagious for 5-10+ days depending on the virus and many other factors. Wear a good mask that fits snugly (such as N95 or KN95) if around others to reduce the risk of transmission.  It was nice to meet you today, and I really hope you are feeling better soon. I help [facility] out with telemedicine visits on Tuesdays and Thursdays and am happy to help if you need a follow up virtual  visit on those days. Otherwise, if you have any concerns or questions following this visit please schedule a follow up visit with your Primary Care doctor or seek care at a local urgent care clinic to avoid delays in care.    Seek in person care or schedule a follow up video visit promptly if your symptoms worsen, new concerns arise or you are not improving with treatment. Call 911 and/or seek emergency care if your symptoms are severe or life threatening.

## 2021-05-15 NOTE — Progress Notes (Signed)
Virtual Visit via Telephone Note  I connected with [name] on 05/15/21 at  5:00 PM EST by telephone and verified that I am speaking with the correct person using two identifiers.   Requested verbal permission for telephone visit. The patient expressed understanding and agreed to proceed.  Location patient: home, [place] Location provider: work or home office Participants present for the call: patient, provider Patient did not have a visit with me in the prior 7 days to address this/these issue(s).   History of Present Illness:  Acute telemedicine visit for PND and sore throat: -Onset: 2 days -Symptoms include: pnd (has persisted since sick a few weeks ago), now has nasal congestion and cough and sore throat, lost sense of taste a few days ago - thinks has a new cold -reports grandkids are always sick ans she gets sick from then -Denies: fevers, CP, SOB, NVD -Has tried: has started allegra -Pertinent past medical history: see below -Pertinent medication allergies:  Allergies  Allergen Reactions   Nefazodone    Trazodone And Nefazodone   -COVID-19 vaccine status:  Immunization History  Administered Date(s) Administered   Influenza-Unspecified 02/24/2014   Moderna Sars-Covid-2 Vaccination 07/24/2019, 08/21/2019   Pneumococcal Conjugate-13 10/09/2017   Td 05/27/2005   Tdap 06/12/2012   Zoster, Live 04/16/2012     Past Medical History:  Diagnosis Date   ALLERGIC RHINITIS    [name]    ANXIETY    Arthritis    ASTHMA    Chronic headaches    COMMON MIGRAINE    DEPRESSION    Diabetes (HCC)    DIVERTICULOSIS, COLON    GERD (gastroesophageal reflux disease) 09/26/2015   HYPERLIPIDEMIA    [name]    Mild mitral regurgitation by prior echocardiogram    Pneumonia     Observations/Objective: Patient sounds cheerful and well on the phone. I do not appreciate any SOB. Speech and thought processing are grossly intact. Patient reported  vitals:  Assessment and Plan:  Sore throat  Nasal congestion  Cough, unspecified type  -we discussed possible serious and likely etiologies, options for evaluation and workup, limitations of telemedicine visit vs in person visit, treatment, treatment risks and precautions. Pt prefers to treat via telemedicine empirically rather than in person at this moment. Query VURI, covid19 vs other. She agrees to covid test and is aware can [facility] or do follow up virtual visit if desires to take antiviral in 1st 5 days of symptoms. Opted for tessalon for cough and symptomatic care per patient instruction.  Advised to seek prompt follow up virtual visit or in person care if worsening, new symptoms arise, or if is not improving with treatment.   Follow Up Instructions:  I did not refer this patient for an OV with me in the next 24 hours for this/these issue(s).  I discussed the assessment and treatment plan with the patient. The patient was provided an opportunity to ask questions and all were answered. The patient agreed with the plan and demonstrated an understanding of the instructions.   I spent 15 minutes on the date of this visit in the care of this patient. See summary of tasks completed to properly care for this patient in the detailed notes above which also included counseling of above, review of PMH, medications, allergies, evaluation of the patient and ordering and/or  instructing patient on testing and care options.     Terressa Koyanagi, DO

## 2021-05-18 ENCOUNTER — Telehealth: Payer: Self-pay | Admitting: Internal Medicine

## 2021-05-18 MED ORDER — HYDROCODONE BIT-HOMATROP MBR 5-1.5 MG/5ML PO SOLN: 5.0000 mL | Freq: Four times a day (QID) | ORAL | 0 refills | Status: DC | PRN

## 2021-05-18 NOTE — Telephone Encounter (Signed)
Patient notified and expresses gratitude.

## 2021-05-18 NOTE — Telephone Encounter (Signed)
Ok  - hycodan done erx 

## 2021-05-18 NOTE — Telephone Encounter (Signed)
Patient calling in  Patient had VV w/ Dr. Selena Batten 12.20.22.. patient says she was prescribed benzonatate (TESSALON PERLES) 100 MG capsule for a cough but it has not been working & they coughing is causing her to have headaches  Wants to know if her provider willing to send in something else to pharmacy  [facility] [place], [place] - [address] [place]  Phone:  [phone] Fax:  [phone]

## 2021-05-23 DIAGNOSIS — L814 Other melanin hyperpigmentation: Secondary | ICD-10-CM | POA: Diagnosis not present

## 2021-05-23 DIAGNOSIS — Z85828 Personal history of other malignant neoplasm of skin: Secondary | ICD-10-CM | POA: Diagnosis not present

## 2021-05-23 DIAGNOSIS — L821 Other seborrheic keratosis: Secondary | ICD-10-CM | POA: Diagnosis not present

## 2021-05-23 DIAGNOSIS — Z808 Family history of malignant neoplasm of other organs or systems: Secondary | ICD-10-CM | POA: Diagnosis not present

## 2021-05-23 DIAGNOSIS — X32XXXS Exposure to sunlight, sequela: Secondary | ICD-10-CM | POA: Diagnosis not present

## 2021-05-24 ENCOUNTER — Other Ambulatory Visit: Payer: Self-pay

## 2021-05-24 ENCOUNTER — Ambulatory Visit: Payer: Medicare PPO | Admitting: Internal Medicine

## 2021-05-24 ENCOUNTER — Encounter: Payer: Self-pay | Admitting: Internal Medicine

## 2021-05-24 ENCOUNTER — Ambulatory Visit (INDEPENDENT_AMBULATORY_CARE_PROVIDER_SITE_OTHER): Payer: Medicare PPO

## 2021-05-24 VITALS — BP 120/70 | HR 83 | Temp 98.7°F | Ht 61.0 in | Wt 143.0 lb

## 2021-05-24 DIAGNOSIS — K219 Gastro-esophageal reflux disease without esophagitis: Secondary | ICD-10-CM | POA: Diagnosis not present

## 2021-05-24 DIAGNOSIS — J309 Allergic rhinitis, unspecified: Secondary | ICD-10-CM | POA: Diagnosis not present

## 2021-05-24 DIAGNOSIS — J45991 Cough variant asthma: Secondary | ICD-10-CM | POA: Diagnosis not present

## 2021-05-24 DIAGNOSIS — R059 Cough, unspecified: Secondary | ICD-10-CM | POA: Diagnosis not present

## 2021-05-24 MED ORDER — HYDROCODONE BIT-HOMATROP MBR 5-1.5 MG/5ML PO SOLN: 5.0000 mL | Freq: Four times a day (QID) | ORAL | 0 refills | Status: AC | PRN

## 2021-05-24 MED ORDER — PANTOPRAZOLE SODIUM 40 MG PO TBEC: 40.0000 mg | DELAYED_RELEASE_TABLET | Freq: Two times a day (BID) | ORAL | 0 refills | Status: DC

## 2021-05-24 MED ORDER — FAMOTIDINE 20 MG PO TABS: 20.0000 mg | ORAL_TABLET | Freq: Two times a day (BID) | ORAL | 0 refills | Status: DC

## 2021-05-24 MED ORDER — PREDNISONE 10 MG PO TABS: ORAL_TABLET | ORAL | 0 refills | Status: DC

## 2021-05-24 NOTE — Patient Instructions (Signed)
Please take all new medication as prescribed - the protonix, pepcid, prednisone, and cough medicine as needed  Ok to [facility] the prilosec while doing this  Please continue all other medications as before, and refills have been done if requested.  Please have the pharmacy call with any other refills you may need..  Please keep your appointments with your specialists as you may have planned  Please go to the XRAY Department in the first floor for the x-ray testing  You will be contacted by phone if any changes need to be made immediately.  Otherwise, you will receive a letter about your results with an explanation, but please check with MyChart first.  Please remember to sign up for MyChart if you have not done so, as this will be important to you in the future with finding out test results, communicating by private email, and scheduling acute appointments online when needed.  Please call in 1-2 wks if not improved for referral back to pulmonary

## 2021-05-24 NOTE — Progress Notes (Signed)
[name], female   DOB: 06-Oct-1950, 70 y.o.   MRN: [id number]        Chief Complaint: follow up persistent cough       HPI:  [name] is a 70 y.o. female here with c/o persistent cough that just wont quit over the past month despite no fever, HA, ST, worsening allergy or overt reflux symptoms.  Pt denies chest pain, increased sob or doe, wheezing, orthopnea, PND, increased LE swelling, palpitations, dizziness or syncope.  Denies worsening reflux, abd pain, dysphagia, n/v, bowel change or blood.   Pt denies fever, wt loss, night sweats, loss of appetite, or other constitutional symptoms          Wt Readings from Last 3 Encounters:  05/24/21 143 lb (64.9 kg)  05/15/21 138 lb (62.6 kg)  05/04/21 141 lb 3.2 oz (64 kg)   BP Readings from Last 3 Encounters:  05/24/21 120/70  05/04/21 112/60  04/25/21 122/78         Past Medical History:  Diagnosis Date   ALLERGIC RHINITIS    [name]    ANXIETY    Arthritis    ASTHMA    Chronic headaches    COMMON MIGRAINE    DEPRESSION    Diabetes (HCC)    DIVERTICULOSIS, COLON    GERD (gastroesophageal reflux disease) 09/26/2015   HYPERLIPIDEMIA    [name]    Mild mitral regurgitation by prior echocardiogram    Pneumonia    Past Surgical History:  Procedure Laterality Date   ABDOMINAL HYSTERECTOMY     APPENDECTOMY     CHOLECYSTECTOMY      reports that she quit smoking about 29 years ago. Her smoking use included cigarettes. She has a 20.00 pack-year smoking history. She has never used smokeless tobacco. She reports that she does not drink alcohol and does not use drugs. family history includes Bone cancer in her maternal aunt; Breast cancer in her maternal aunt; Cancer in her maternal uncle; Heart disease in her brother, father, mother, and paternal grandfather; Leukemia in her maternal grandmother; Other in her son. Allergies  Allergen Reactions   Nefazodone    Trazodone And Nefazodone     Current Outpatient Medications on File Prior to Visit  Medication Sig Dispense Refill   Acetaminophen-guaiFENesin (MUCINEX COLD & FLU PO) Take by mouth.     amitriptyline (ELAVIL) 50 MG tablet TAKE 2 TABLETS AT BEDTIME. 180 tablet 1   aspirin EC 81 MG tablet Take 1 tablet (81 mg total) by mouth daily. 90 tablet 11   atorvastatin (LIPITOR) 10 MG tablet TAKE 1 TABLET ONCE DAILY. 90 tablet 0   butalbital-acetaminophen-caffeine (FIORICET) 50-325-40 MG tablet TAKE ONE TABLET BY MOUTH TWICE DAILY AS NEEDED FOR HEADACHE 60 tablet 0   Calcium Carbonate-Vit D-Min (CALCIUM 1200 PO) Take 1 capsule by mouth daily.     fexofenadine (ALLEGRA) 180 MG tablet Take 180 mg by mouth daily.     Lactobacillus-Inulin (CULTURELLE DIGESTIVE DAILY PO) Take by mouth.     omeprazole (PRILOSEC) 20 MG capsule TAKE (1) CAPSULE DAILY. 90 capsule 0   VITAMIN D PO Take by mouth 2 (two) times daily.     benzonatate (TESSALON PERLES) 100 MG capsule 1-2 capsules up to twice daily as needed for cough (Patient not taking: Reported on 05/24/2021) 30 capsule 0   No current facility-administered medications on file prior to visit.        ROS:  All others reviewed and negative.  Objective        PE:  BP 120/70 (BP Location: Right Arm, Patient Position: Sitting, Cuff Size: Normal)    Pulse 83    Temp 98.7 F (37.1 C) (Oral)    Ht 5\' 1"  (1.549 m)    Wt 143 lb (64.9 kg)    SpO2 99%    BMI 27.02 kg/m                 Constitutional: Pt appears in NAD               HENT: Head: NCAT.                Right Ear: External ear normal.                 Left Ear: External ear normal.                Eyes: . Pupils are equal, round, and reactive to light. Conjunctivae and EOM are normal               Nose: without d/c or deformity               Neck: Neck supple. Gross normal ROM               Cardiovascular: Normal rate and regular rhythm.                 Pulmonary/Chest: Effort normal and breath sounds without rales or wheezing.                 Abd:  Soft, NT, ND, + BS, no organomegaly               Neurological: Pt is alert. At baseline orientation, motor grossly intact               Skin: Skin is warm. No rashes, no other new lesions, LE edema - none               Psychiatric: Pt behavior is normal without agitation   Micro: none  Cardiac tracings I have personally interpreted today:  none  Pertinent Radiological findings (summarize): none   Lab Results  Component Value Date   WBC 7.5 02/20/2021   HGB 13.4 02/20/2021   HCT 40.4 02/20/2021   PLT 322.0 02/20/2021   GLUCOSE 91 02/20/2021   CHOL 189 02/20/2021   TRIG 223.0 (H) 02/20/2021   HDL 66.80 02/20/2021   LDLDIRECT 103.0 02/20/2021   LDLCALC 74 10/09/2017   ALT 15 02/20/2021   AST 16 02/20/2021   NA 139 02/20/2021   K 4.6 02/20/2021   CL 103 02/20/2021   CREATININE 0.80 02/20/2021   BUN 14 02/20/2021   CO2 29 02/20/2021   TSH 2.92 02/20/2021   INR 1.0 05/10/2008   HGBA1C 6.0 02/20/2021   Assessment/Plan:  [name] is a 70 y.o. White or Caucasian [1] female with  has a past medical history of ALLERGIC RHINITIS, [name], ANXIETY, Arthritis, ASTHMA, Chronic headaches, COMMON MIGRAINE, DEPRESSION, Diabetes (HCC), DIVERTICULOSIS, COLON, GERD (gastroesophageal reflux disease) (09/26/2015), HYPERLIPIDEMIA, [name], Mild mitral regurgitation by prior echocardiogram, and Pneumonia.  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  [name], [name]  05/26/2021 5:14 PM [facility]

## 2021-05-24 NOTE — Assessment & Plan Note (Signed)
Pt appaers to be in similar situation as June 2019 - will try approach similar to Dr Sherene Sires then - ok for prednisone asd, protonix bid, and pepcid bid for 1 wk, cough med prn, cxr today, and consider referral back to pulmonary if not improved

## 2021-05-26 ENCOUNTER — Encounter: Payer: Self-pay | Admitting: Internal Medicine

## 2021-05-26 NOTE — Assessment & Plan Note (Signed)
Likely to improve with pepcid/protonix today as above

## 2021-05-26 NOTE — Assessment & Plan Note (Signed)
likley to improve with prednisone as above,  to f/u any worsening symptoms or concerns

## 2021-06-18 ENCOUNTER — Encounter: Payer: Self-pay | Admitting: Internal Medicine

## 2021-06-18 ENCOUNTER — Ambulatory Visit: Payer: Medicare PPO | Admitting: Internal Medicine

## 2021-06-18 ENCOUNTER — Other Ambulatory Visit: Payer: Self-pay

## 2021-06-18 VITALS — BP 126/78 | HR 96 | Temp 97.8°F | Ht 61.0 in | Wt 140.0 lb

## 2021-06-18 DIAGNOSIS — B9689 Other specified bacterial agents as the cause of diseases classified elsewhere: Secondary | ICD-10-CM | POA: Diagnosis not present

## 2021-06-18 DIAGNOSIS — N390 Urinary tract infection, site not specified: Secondary | ICD-10-CM | POA: Insufficient documentation

## 2021-06-18 DIAGNOSIS — N3000 Acute cystitis without hematuria: Secondary | ICD-10-CM | POA: Diagnosis not present

## 2021-06-18 DIAGNOSIS — R300 Dysuria: Secondary | ICD-10-CM | POA: Diagnosis not present

## 2021-06-18 MED ORDER — METHENAMINE HIPPURATE 1 G PO TABS: 1.0000 g | ORAL_TABLET | Freq: Two times a day (BID) | ORAL | 1 refills | Status: DC

## 2021-06-18 NOTE — Patient Instructions (Signed)
Urinary Tract Infection, Adult A urinary tract infection (UTI) is an infection of any part of the urinary tract. The urinary tract includes the kidneys, ureters, bladder, and urethra. These organs make, store, and get rid of urine in the body. An upper UTI affects the ureters and kidneys. A lower UTI affects the bladder and urethra. What are the causes? Most urinary tract infections are caused by bacteria in your genital area around your urethra, where urine leaves your body. These bacteria grow and cause inflammation of your urinary tract. What increases the risk? You are more likely to develop this condition if: You have a urinary catheter that stays in place. You are not able to control when you urinate or have a bowel movement (incontinence). You are female and you: Use a spermicide or diaphragm for birth control. Have low estrogen levels. Are pregnant. You have certain genes that increase your risk. You are sexually active. You take antibiotic medicines. You have a condition that causes your flow of urine to slow down, such as: An enlarged prostate, if you are female. Blockage in your urethra. A kidney stone. A nerve condition that affects your bladder control (neurogenic bladder). Not getting enough to drink, or not urinating often. You have certain medical conditions, such as: Diabetes. A weak disease-fighting system (immunesystem). Sickle cell disease. Gout. Spinal cord injury. What are the signs or symptoms? Symptoms of this condition include: Needing to urinate right away (urgency). Frequent urination. This may include small amounts of urine each time you urinate. Pain or burning with urination. Blood in the urine. Urine that smells bad or unusual. Trouble urinating. Cloudy urine. Vaginal discharge, if you are female. Pain in the abdomen or the lower back. You may also have: Vomiting or a decreased appetite. Confusion. Irritability or tiredness. A fever or  chills. Diarrhea. The first symptom in older adults may be confusion. In some cases, they may not have any symptoms until the infection has worsened. How is this diagnosed? This condition is diagnosed based on your medical history and a physical exam. You may also have other tests, including: Urine tests. Blood tests. Tests for STIs (sexually transmitted infections). If you have had more than one UTI, a cystoscopy or imaging studies may be done to determine the cause of the infections. How is this treated? Treatment for this condition includes: Antibiotic medicine. Over-the-counter medicines to treat discomfort. Drinking enough water to stay hydrated. If you have frequent infections or have other conditions such as a kidney stone, you may need to see a health care provider who specializes in the urinary tract (urologist). In rare cases, urinary tract infections can cause sepsis. Sepsis is a life-threatening condition that occurs when the body responds to an infection. Sepsis is treated in the hospital with IV antibiotics, fluids, and other medicines. Follow these instructions at home: Medicines Take over-the-counter and prescription medicines only as told by your health care provider. If you were prescribed an antibiotic medicine, take it as told by your health care provider. Do not stop using the antibiotic even if you start to feel better. General instructions Make sure you: Empty your bladder often and completely. Do not hold urine for long periods of time. Empty your bladder after sex. Wipe from front to back after urinating or having a bowel movement if you are female. Use each tissue only one time when you wipe. Drink enough fluid to keep your urine pale yellow. Keep all follow-up visits. This is important. Contact a health care provider   if: Your symptoms do not get better after 1-2 days. Your symptoms go away and then return. Get help right away if: You have severe pain in your  back or your lower abdomen. You have a fever or chills. You have nausea or vomiting. Summary A urinary tract infection (UTI) is an infection of any part of the urinary tract, which includes the kidneys, ureters, bladder, and urethra. Most urinary tract infections are caused by bacteria in your genital area. Treatment for this condition often includes antibiotic medicines. If you were prescribed an antibiotic medicine, take it as told by your health care provider. Do not stop using the antibiotic even if you start to feel better. Keep all follow-up visits. This is important. This information is not intended to replace advice given to you by your health care provider. Make sure you discuss any questions you have with your health care provider. Document Revised: 12/24/2019 Document Reviewed: 12/24/2019 Elsevier Patient Education  2022 [employer]  

## 2021-06-18 NOTE — Progress Notes (Signed)
Subjective:  [name], female    DOB: 08/26/1950  Age: 71 y.o. MRN: [id number]  CC: Urinary Tract Infection  This visit occurred during the SARS-CoV-2 public health emergency.  Safety protocols were in place, including screening questions prior to the visit, additional usage of staff PPE, and extensive cleaning of exam room while observing appropriate contact time as indicated for disinfecting solutions.    HPI [name] presents for f/up -  She complains of a one day hx of dysuria and nausea. She reports 3 UTIs over the last 3 months.  Outpatient Medications Prior to Visit  Medication Sig Dispense Refill   Acetaminophen-guaiFENesin (MUCINEX COLD & FLU PO) Take by mouth.     amitriptyline (ELAVIL) 50 MG tablet TAKE 2 TABLETS AT BEDTIME. 180 tablet 1   aspirin EC 81 MG tablet Take 1 tablet (81 mg total) by mouth daily. 90 tablet 11   atorvastatin (LIPITOR) 10 MG tablet TAKE 1 TABLET ONCE DAILY. 90 tablet 0   benzonatate (TESSALON PERLES) 100 MG capsule 1-2 capsules up to twice daily as needed for cough 30 capsule 0   butalbital-acetaminophen-caffeine (FIORICET) 50-325-40 MG tablet TAKE ONE TABLET BY MOUTH TWICE DAILY AS NEEDED FOR HEADACHE 60 tablet 0   Calcium Carbonate-Vit D-Min (CALCIUM 1200 PO) Take 1 capsule by mouth daily.     fexofenadine (ALLEGRA) 180 MG tablet Take 180 mg by mouth daily.     Lactobacillus-Inulin (CULTURELLE DIGESTIVE DAILY PO) Take by mouth.     omeprazole (PRILOSEC) 20 MG capsule TAKE (1) CAPSULE DAILY. 90 capsule 0   predniSONE (DELTASONE) 10 MG tablet 4 tabs by mouth per day for 2 days, 2 tabs per day for 2 days, 1tab per day for 2 days then stop 14 tablet 0   VITAMIN D PO Take by mouth 2 (two) times daily.     famotidine (PEPCID) 20 MG tablet Take 1 tablet (20 mg total) by mouth 2 (two) times daily for 7 days. 14 tablet 0   pantoprazole (PROTONIX) 40 MG tablet Take 1 tablet (40 mg total) by mouth 2 (two) times daily before a meal for 7  days. 14 tablet 0   No facility-administered medications prior to visit.    ROS Review of Systems  Constitutional:  Negative for chills, fatigue and fever.  HENT: Negative.    Eyes: Negative.   Respiratory:  Negative for cough, shortness of breath and wheezing.   Cardiovascular:  Negative for chest pain, palpitations and leg swelling.  Gastrointestinal:  Negative for abdominal pain, constipation, diarrhea, nausea and vomiting.  Endocrine: Negative.   Genitourinary:  Positive for dysuria. Negative for difficulty urinating, flank pain, frequency, hematuria, pelvic pain, urgency, vaginal bleeding and vaginal discharge.  Musculoskeletal: Negative.  Negative for arthralgias.  Skin: Negative.   Neurological:  Negative for dizziness, weakness, light-headedness and headaches.  Hematological:  Negative for adenopathy. Does not bruise/bleed easily.  Psychiatric/Behavioral: Negative.     Objective:  BP 126/78 (BP Location: Right Arm, Patient Position: Sitting, Cuff Size: Large)    Pulse 96    Temp 97.8 F (36.6 C) (Oral)    Ht 5\' 1"  (1.549 m)    Wt 140 lb (63.5 kg)    SpO2 95%    BMI 26.45 kg/m   BP Readings from Last 3 Encounters:  06/18/21 126/78  05/24/21 120/70  05/04/21 112/60    Wt Readings from Last 3 Encounters:  06/18/21 140 lb (63.5 kg)  05/24/21 143 lb (64.9 kg)  05/15/21 138 lb (62.6 kg)    Physical Exam Constitutional:      Appearance: She is not ill-appearing.  HENT:     Nose: Nose normal.     Mouth/Throat:     Mouth: Mucous membranes are moist.  Eyes:     General: No scleral icterus.    Conjunctiva/sclera: Conjunctivae normal.  Cardiovascular:     Rate and Rhythm: Normal rate and regular rhythm.     Heart sounds: No murmur heard. Pulmonary:     Effort: Pulmonary effort is normal.     Breath sounds: No stridor. No wheezing, rhonchi or rales.  Abdominal:     General: Abdomen is flat. Bowel sounds are normal. There is no distension.     Palpations: Abdomen is  soft. There is no hepatomegaly, splenomegaly or mass.     Tenderness: There is no abdominal tenderness.  Musculoskeletal:        General: Normal range of motion.     Cervical back: Neck supple.  Lymphadenopathy:     Cervical: No cervical adenopathy.  Skin:    General: Skin is warm and dry.     Findings: No rash.  Neurological:     General: No focal deficit present.     Mental Status: She is alert.    Lab Results  Component Value Date   WBC 7.5 02/20/2021   HGB 13.4 02/20/2021   HCT 40.4 02/20/2021   PLT 322.0 02/20/2021   GLUCOSE 91 02/20/2021   CHOL 189 02/20/2021   TRIG 223.0 (H) 02/20/2021   HDL 66.80 02/20/2021   LDLDIRECT 103.0 02/20/2021   LDLCALC 74 10/09/2017   ALT 15 02/20/2021   AST 16 02/20/2021   NA 139 02/20/2021   K 4.6 02/20/2021   CL 103 02/20/2021   CREATININE 0.80 02/20/2021   BUN 14 02/20/2021   CO2 29 02/20/2021   TSH 2.92 02/20/2021   INR 1.0 05/10/2008   HGBA1C 6.0 02/20/2021    CT ABDOMEN PELVIS W CONTRAST  Result Date: 04/28/2020 CLINICAL DATA:  Abdominal pain. EXAM: CT ABDOMEN AND PELVIS WITH CONTRAST TECHNIQUE: Multidetector CT imaging of the abdomen and pelvis was performed using the standard protocol following bolus administration of intravenous contrast. CONTRAST:  133mL ISOVUE-300 IOPAMIDOL (ISOVUE-300) INJECTION 61% COMPARISON:  March 20, 2018. FINDINGS: Lower chest: No acute abnormality. Hepatobiliary: No focal liver abnormality is seen. No gallstones, gallbladder wall thickening, or biliary dilatation. Pancreas: Unremarkable. No pancreatic ductal dilatation or surrounding inflammatory changes. Spleen: Normal in size without focal abnormality. Adrenals/Urinary Tract: Adrenal glands are unremarkable. Kidneys are normal, without renal calculi, focal lesion, or hydronephrosis. Bladder is unremarkable. Stomach/Bowel: The stomach appears normal. Status post appendectomy. There is no evidence of bowel obstruction. Sigmoid diverticulitis is noted  without abscess formation. Vascular/Lymphatic: Aortic atherosclerosis. No enlarged abdominal or pelvic lymph nodes. Reproductive: Status post hysterectomy. No adnexal masses. Other: No abdominal wall hernia or abnormality. No abdominopelvic ascites. Musculoskeletal: No acute or significant osseous findings. IMPRESSION: 1. Sigmoid diverticulitis is noted without abscess formation. These results will be called to the ordering clinician or representative by the Radiologist Assistant, and communication documented in the PACS or zVision Dashboard. 2. Aortic atherosclerosis. Aortic Atherosclerosis (ICD10-I70.0). Electronically Signed   By: Marijo Conception M.D.   On: 04/28/2020 10:54    Assessment & Plan:   Amneris was seen today for urinary tract infection.  Diagnoses and all orders for this visit:  Dysuria -     CULTURE, URINE COMPREHENSIVE; Future -  Urinalysis, Routine w reflex microscopic; Future -     Urinalysis, Routine w reflex microscopic -     CULTURE, URINE COMPREHENSIVE  Recurrent UTI -     methenamine (HIPREX) 1 g tablet; Take 1 tablet (1 g total) by mouth 2 (two) times daily with a meal.  Acute cystitis without hematuria- The urine culture is positive for Citrobacter, sensitive to Bactrim DS. -     sulfamethoxazole-trimethoprim (BACTRIM DS) 800-160 MG tablet; Take 1 tablet by mouth 2 (two) times daily for 5 days.   I am having Derotha Fegley. Cerveny start on methenamine and sulfamethoxazole-trimethoprim. I am also having her maintain her Calcium Carbonate-Vit D-Min (CALCIUM 1200 PO), aspirin EC, VITAMIN D PO, atorvastatin, omeprazole, butalbital-acetaminophen-caffeine, amitriptyline, Lactobacillus-Inulin (CULTURELLE DIGESTIVE DAILY PO), benzonatate, Acetaminophen-guaiFENesin (MUCINEX COLD & FLU PO), fexofenadine, predniSONE, pantoprazole, and famotidine.  Meds ordered this encounter  Medications   methenamine (HIPREX) 1 g tablet    Sig: Take 1 tablet (1 g total) by mouth 2 (two) times daily  with a meal.    Dispense:  180 tablet    Refill:  1   sulfamethoxazole-trimethoprim (BACTRIM DS) 800-160 MG tablet    Sig: Take 1 tablet by mouth 2 (two) times daily for 5 days.    Dispense:  10 tablet    Refill:  0     Follow-up: Return if symptoms worsen or fail to improve.  Scarlette Calico, MD

## 2021-06-19 DIAGNOSIS — N3000 Acute cystitis without hematuria: Secondary | ICD-10-CM | POA: Insufficient documentation

## 2021-06-19 LAB — URINALYSIS, ROUTINE W REFLEX MICROSCOPIC
Bilirubin Urine: NEGATIVE
Hgb urine dipstick: NEGATIVE
Ketones, ur: NEGATIVE
Nitrite: NEGATIVE
RBC / HPF: NONE SEEN (ref 0–?)
Specific Gravity, Urine: >=1.03 — AB (ref 1.000–1.030)
Total Protein, Urine: NEGATIVE
Urine Glucose: NEGATIVE
Urobilinogen, UA: 0.2 (ref 0.0–1.0)
pH: 5.5 (ref 5.0–8.0)

## 2021-06-19 MED ORDER — SULFAMETHOXAZOLE-TRIMETHOPRIM 800-160 MG PO TABS: 1.0000 | ORAL_TABLET | Freq: Two times a day (BID) | ORAL | 0 refills | Status: AC

## 2021-06-20 LAB — CULTURE, URINE COMPREHENSIVE

## 2021-07-14 ENCOUNTER — Other Ambulatory Visit: Payer: Self-pay | Admitting: Internal Medicine

## 2021-07-25 ENCOUNTER — Ambulatory Visit: Payer: Medicare PPO | Admitting: Internal Medicine

## 2021-07-26 DIAGNOSIS — Z780 Asymptomatic menopausal state: Secondary | ICD-10-CM | POA: Diagnosis not present

## 2021-07-26 DIAGNOSIS — Z1231 Encounter for screening mammogram for malignant neoplasm of breast: Secondary | ICD-10-CM | POA: Diagnosis not present

## 2021-07-26 DIAGNOSIS — R300 Dysuria: Secondary | ICD-10-CM | POA: Diagnosis not present

## 2021-07-26 DIAGNOSIS — Z01419 Encounter for gynecological examination (general) (routine) without abnormal findings: Secondary | ICD-10-CM | POA: Diagnosis not present

## 2021-07-26 DIAGNOSIS — R208 Other disturbances of skin sensation: Secondary | ICD-10-CM | POA: Diagnosis not present

## 2021-08-21 ENCOUNTER — Ambulatory Visit: Payer: Medicare PPO | Admitting: Internal Medicine

## 2021-09-08 DIAGNOSIS — J209 Acute bronchitis, unspecified: Secondary | ICD-10-CM | POA: Diagnosis not present

## 2021-09-16 DIAGNOSIS — Z20828 Contact with and (suspected) exposure to other viral communicable diseases: Secondary | ICD-10-CM | POA: Diagnosis not present

## 2021-09-25 ENCOUNTER — Ambulatory Visit: Payer: Medicare PPO | Admitting: Internal Medicine

## 2021-09-25 ENCOUNTER — Encounter: Payer: Self-pay | Admitting: Internal Medicine

## 2021-09-25 VITALS — BP 112/62 | HR 84 | Temp 97.8°F | Ht 61.0 in | Wt 141.2 lb

## 2021-09-25 DIAGNOSIS — J4531 Mild persistent asthma with (acute) exacerbation: Secondary | ICD-10-CM | POA: Diagnosis not present

## 2021-09-25 DIAGNOSIS — J309 Allergic rhinitis, unspecified: Secondary | ICD-10-CM

## 2021-09-25 DIAGNOSIS — R051 Acute cough: Secondary | ICD-10-CM

## 2021-09-25 DIAGNOSIS — R079 Chest pain, unspecified: Secondary | ICD-10-CM

## 2021-09-25 DIAGNOSIS — J45909 Unspecified asthma, uncomplicated: Secondary | ICD-10-CM | POA: Insufficient documentation

## 2021-09-25 MED ORDER — PREDNISONE 10 MG PO TABS: ORAL_TABLET | ORAL | 0 refills | Status: DC

## 2021-09-25 MED ORDER — AZITHROMYCIN 250 MG PO TABS: ORAL_TABLET | ORAL | 1 refills | Status: AC

## 2021-09-25 MED ORDER — HYDROCODONE BIT-HOMATROP MBR 5-1.5 MG/5ML PO SOLN: 5.0000 mL | Freq: Four times a day (QID) | ORAL | 0 refills | Status: AC | PRN

## 2021-09-25 MED ORDER — METHYLPREDNISOLONE ACETATE 40 MG/ML IJ SUSP
80.0000 mg | Freq: Once | INTRAMUSCULAR | Status: AC
  Administered 2021-09-25: 80 mg via INTRAMUSCULAR

## 2021-09-25 NOTE — Progress Notes (Signed)
[name], female   DOB: 05-02-1951, 71 y.o.   MRN: [id number] ? ? ? ?    Chief Complaint: follow up acute cough and wheezing ? ?     HPI:  [name] is a 71 y.o. female here with c/o 3 days onset mild ST, scant prod cough , wheezing, mild sob and fleeting chest sharp pains with cough only.  Pt denies other chest pain, increased sob or doe, orthopnea, PND, increased LE swelling, palpitations, dizziness or syncope.   Pt denies polydipsia, polyuria, or new focal neuro s/s.    Pt denies fever, wt loss, night sweats, loss of appetite, or other constitutional symptoms  Does have several wks ongoing nasal allergy symptoms with clearish congestion, itch and sneezing, without fever, pain, ST, cough, swelling or wheezing. ?      ?Wt Readings from Last 3 Encounters:  ?09/25/21 141 lb 3.2 oz (64 kg)  ?06/18/21 140 lb (63.5 kg)  ?05/24/21 143 lb (64.9 kg)  ? ?BP Readings from Last 3 Encounters:  ?09/25/21 112/62  ?06/18/21 126/78  ?05/24/21 120/70  ? ?      ?Past Medical History:  ?Diagnosis Date  ? ALLERGIC RHINITIS   ? [name]   ? ANXIETY   ? Arthritis   ? ASTHMA   ? Chronic headaches   ? COMMON MIGRAINE   ? DEPRESSION   ? Diabetes ([place])   ? DIVERTICULOSIS, COLON   ? GERD (gastroesophageal reflux disease) 09/26/2015  ? HYPERLIPIDEMIA   ? [name]   ? Mild mitral regurgitation by prior echocardiogram   ? Pneumonia   ? ?Past Surgical History:  ?Procedure Laterality Date  ? ABDOMINAL HYSTERECTOMY    ? APPENDECTOMY    ? CHOLECYSTECTOMY    ? ? reports that she quit smoking about 29 years ago. Her smoking use included cigarettes. She has a 20.00 pack-year smoking history. She has never used smokeless tobacco. She reports that she does not drink alcohol and does not use drugs. ?family history includes Bone cancer in her maternal aunt; Breast cancer in her maternal aunt; Cancer in her maternal uncle; Heart disease in her brother, father, mother, and paternal grandfather; Leukemia in  her maternal grandmother; Other in her son. ?Allergies  ?Allergen Reactions  ? Nefazodone   ? Trazodone And Nefazodone   ? ?Current Outpatient Medications on File Prior to Visit  ?Medication Sig Dispense Refill  ? Acetaminophen-guaiFENesin (MUCINEX COLD & FLU PO) Take by mouth.    ? amitriptyline (ELAVIL) 50 MG tablet TAKE 2 TABLETS AT BEDTIME. 180 tablet 1  ? aspirin EC 81 MG tablet Take 1 tablet (81 mg total) by mouth daily. 90 tablet 11  ? atorvastatin (LIPITOR) 10 MG tablet TAKE 1 TABLET ONCE DAILY. 90 tablet 0  ? butalbital-acetaminophen-caffeine (FIORICET) 50-325-40 MG tablet TAKE ONE TABLET BY MOUTH TWICE DAILY AS NEEDED FOR HEADACHE 60 tablet 0  ? Calcium Carbonate-Vit D-Min (CALCIUM 1200 PO) Take 1 capsule by mouth daily.    ? fexofenadine (ALLEGRA) 180 MG tablet Take 180 mg by mouth daily.    ? Lactobacillus-Inulin (CULTURELLE DIGESTIVE DAILY PO) Take by mouth.    ? methenamine (HIPREX) 1 g tablet Take 1 tablet (1 g total) by mouth 2 (two) times daily with a meal. 180 tablet 1  ? omeprazole (PRILOSEC) 20 MG capsule TAKE (1) CAPSULE DAILY. 90 capsule 0  ? VITAMIN D PO Take by mouth 2 (two) times daily.    ? ?No current facility-administered medications  on file prior to visit.  ? ?     ROS:  All others reviewed and negative. ? ?Objective  ? ?     PE:  BP 112/62 (BP Location: Right Arm, Patient Position: Sitting, Cuff Size: Large)   Pulse 84   Temp 97.8 ?F (36.6 ?C) (Oral)   Ht 5\' 1"  (1.549 m)   Wt 141 lb 3.2 oz (64 kg)   SpO2 96%   BMI 26.68 kg/m?  ? ?              Constitutional: Pt appears in NAD, mild ill ?              HENT: Head: NCAT.  ?              Right Ear: External ear normal.   ?              Left Ear: External ear normal.  Bilat tm's with mild erythema.  Max sinus areas non tender.  Pharynx with mild erythema, no exudate ?              Eyes: . Pupils are equal, round, and reactive to light. Conjunctivae and EOM are normal ?              Nose: without d/c or deformity ?              Neck:  Neck supple. Gross normal ROM ?              Cardiovascular: Normal rate and regular rhythm.   ?              Pulmonary/Chest: Effort normal and breath sounds dcreased without rales but with few mild bilateral wheezing.  ?               ?              Neurological: Pt is alert. At baseline orientation, motor grossly intact ?              Skin: Skin is warm. No rashes, no other new lesions, LE edema - none ?              Psychiatric: Pt behavior is normal without agitation  ? ?Micro: none ? ?Cardiac tracings I have personally interpreted today:  NSR - 83 ? ?Pertinent Radiological findings (summarize): none  ? ?Lab Results  ?Component Value Date  ? WBC 7.5 02/20/2021  ? HGB 13.4 02/20/2021  ? HCT 40.4 02/20/2021  ? PLT 322.0 02/20/2021  ? GLUCOSE 91 02/20/2021  ? CHOL 189 02/20/2021  ? TRIG 223.0 (H) 02/20/2021  ? HDL 66.80 02/20/2021  ? LDLDIRECT 103.0 02/20/2021  ? [place] 74 10/09/2017  ? ALT 15 02/20/2021  ? AST 16 02/20/2021  ? NA 139 02/20/2021  ? K 4.6 02/20/2021  ? CL 103 02/20/2021  ? CREATININE 0.80 02/20/2021  ? BUN 14 02/20/2021  ? CO2 29 02/20/2021  ? TSH 2.92 02/20/2021  ? INR 1.0 05/10/2008  ? HGBA1C 6.0 02/20/2021  ? ?Assessment/Plan:  ?[name] is a 71 y.o. White or Caucasian [1] female with  has a past medical history of ALLERGIC RHINITIS, [name], ANXIETY, Arthritis, ASTHMA, Chronic headaches, COMMON MIGRAINE, DEPRESSION, Diabetes ([place]), DIVERTICULOSIS, COLON, GERD (gastroesophageal reflux disease) (09/26/2015), HYPERLIPIDEMIA, [name], Mild mitral regurgitation by prior echocardiogram, and Pneumonia. ? ?Asthmatic bronchitis ?Acute onset, etiology unclear, cant r/o atypical bact,, for antibx, cough med, prednisone taper,  to f/u  any worsening symptoms or concerns ? ?Cough ?Declines cxr for now, for cough med prn ? ?Allergic rhinitis ?Also for prednisone taper,  to f/u any worsening symptoms or concerns ? ?Pain in the chest ?Atypical, ecg reviewed, very low  suspicion for cardiac, likely msk,  to f/u any worsening symptoms or concerns ? ?Followup: No follow-ups on file. ? ?Cathlean Cower, MD 09/30/2021 8:24 PM ?[place] ?[place] ?Internal Medicine ?

## 2021-09-25 NOTE — Patient Instructions (Signed)
Please take all new medication as prescribed - the antibiotic, prednisone, and cough medicine as needed ? ?You had the steroid shot today ? ?Your EKG was normal today ? ?Please continue all other medications as before, and refills have been done if requested. ? ?Please have the pharmacy call with any other refills you may need. ? ?Please keep your appointments with your specialists as you may have planned ? ? ? ? ?

## 2021-09-27 ENCOUNTER — Telehealth: Payer: Self-pay | Admitting: Internal Medicine

## 2021-09-27 ENCOUNTER — Ambulatory Visit (INDEPENDENT_AMBULATORY_CARE_PROVIDER_SITE_OTHER)
Admission: RE | Admit: 2021-09-27 | Discharge: 2021-09-27 | Disposition: A | Discharge status: Home / Self Care | Payer: Medicare PPO | Source: Ambulatory Visit | Attending: Internal Medicine | Admitting: Internal Medicine

## 2021-09-27 DIAGNOSIS — J4531 Mild persistent asthma with (acute) exacerbation: Secondary | ICD-10-CM

## 2021-09-27 DIAGNOSIS — R059 Cough, unspecified: Secondary | ICD-10-CM | POA: Diagnosis not present

## 2021-09-27 DIAGNOSIS — R0602 Shortness of breath: Secondary | ICD-10-CM | POA: Diagnosis not present

## 2021-09-27 NOTE — Telephone Encounter (Signed)
Pt states at 5-2 ov provider recommended a chest xray which she declined ? ?Pt states she now would like an order for the xray ? ?Pt requesting a cb once order has been placed ?

## 2021-09-27 NOTE — Telephone Encounter (Signed)
Ok order for cxr is In ?

## 2021-09-27 NOTE — Telephone Encounter (Signed)
Patient notified

## 2021-09-30 ENCOUNTER — Encounter: Payer: Self-pay | Admitting: Internal Medicine

## 2021-09-30 NOTE — Assessment & Plan Note (Addendum)
Acute onset, etiology unclear, cant r/o atypical bact,, for antibx, cough med, prednisone taper,  to f/u any worsening symptoms or concerns ?

## 2021-09-30 NOTE — Assessment & Plan Note (Signed)
Also for prednisone taper,  to f/u any worsening symptoms or concerns ?

## 2021-09-30 NOTE — Assessment & Plan Note (Signed)
Declines cxr for now, for cough med prn ?

## 2021-09-30 NOTE — Assessment & Plan Note (Signed)
Atypical, ecg reviewed, very low suspicion for cardiac, likely msk,  to f/u any worsening symptoms or concerns ?

## 2021-10-04 DIAGNOSIS — J9801 Acute bronchospasm: Secondary | ICD-10-CM | POA: Diagnosis not present

## 2021-10-04 DIAGNOSIS — Z1211 Encounter for screening for malignant neoplasm of colon: Secondary | ICD-10-CM | POA: Diagnosis not present

## 2021-10-04 DIAGNOSIS — R079 Chest pain, unspecified: Secondary | ICD-10-CM | POA: Diagnosis not present

## 2021-10-04 DIAGNOSIS — R059 Cough, unspecified: Secondary | ICD-10-CM | POA: Diagnosis not present

## 2021-10-04 DIAGNOSIS — R499 Unspecified voice and resonance disorder: Secondary | ICD-10-CM | POA: Diagnosis not present

## 2021-10-24 ENCOUNTER — Ambulatory Visit: Payer: Medicare PPO | Admitting: Cardiology

## 2021-10-24 ENCOUNTER — Encounter: Payer: Self-pay | Admitting: Cardiology

## 2021-10-24 VITALS — BP 132/76 | HR 81 | Temp 97.2°F | Resp 16 | Ht 61.0 in | Wt 140.0 lb

## 2021-10-24 DIAGNOSIS — R0609 Other forms of dyspnea: Secondary | ICD-10-CM | POA: Diagnosis not present

## 2021-10-24 DIAGNOSIS — I700 Atherosclerosis of aorta: Secondary | ICD-10-CM

## 2021-10-24 DIAGNOSIS — R072 Precordial pain: Secondary | ICD-10-CM | POA: Diagnosis not present

## 2021-10-24 DIAGNOSIS — R9431 Abnormal electrocardiogram [ECG] [EKG]: Secondary | ICD-10-CM | POA: Diagnosis not present

## 2021-10-24 DIAGNOSIS — E782 Mixed hyperlipidemia: Secondary | ICD-10-CM | POA: Diagnosis not present

## 2021-10-24 NOTE — Progress Notes (Signed)
Primary Physician/Referring:  Caren Macadam, MD  [name], female    DOB: December 14, 1950, 71 y.o.   MRN: [id number]  Chief Complaint  Patient presents with   Chest Pain   New Patient (Initial Visit)   HPI:    [name]  is a 71 y.o. Caucasian female patient with mixed hyperlipidemia, prediabetes mellitus, reactive airway disease referred to me for evaluation of chest pain.  Chest pain symptoms have been ongoing for the past 4 to 6 weeks, describes them as tightness in the chest, with no radiation, sometimes with exertional activity and sometimes after having exerted for a little bit of time and sometimes at rest.  Symptoms are atypical.  She has also had dyspnea on exertion for the past 3 to 4 months.  No PND or orthopnea or leg edema.  Past Medical History:  Diagnosis Date   ALLERGIC RHINITIS    [name]    ANXIETY    Arthritis    ASTHMA    Chronic headaches    COMMON MIGRAINE    DEPRESSION    Diabetes ([place])    DIVERTICULOSIS, COLON    GERD (gastroesophageal reflux disease) 09/26/2015   HYPERLIPIDEMIA    [name]    Mild mitral regurgitation by prior echocardiogram    Pneumonia    Past Surgical History:  Procedure Laterality Date   ABDOMINAL HYSTERECTOMY     APPENDECTOMY     CHOLECYSTECTOMY     Family History  Problem Relation Age of Onset   Heart disease Mother    Heart disease Father    Heart attack Father 34   Heart disease Brother    Breast cancer Maternal Aunt    Bone cancer Maternal Aunt    Cancer Maternal Uncle        type unknown   Leukemia Maternal Grandmother    Heart disease Paternal Grandfather    Other Son        Hypoglycemia   Colon cancer Neg Hx     Social History   Tobacco Use   Smoking status: Former    Packs/day: 1.00    Years: 20.00    Pack years: 20.00    Types: Cigarettes    Quit date: 05/27/1992    Years since quitting: 29.4   Smokeless tobacco: Never  Substance Use Topics    Alcohol use: Yes    Comment: occasional -1 a month   Marital Status: Divorced  ROS  Review of Systems  Cardiovascular:  Positive for chest pain and dyspnea on exertion. Negative for leg swelling.  Respiratory:  Positive for cough.   Objective  Blood pressure 132/76, pulse 81, temperature (!) 97.2 F (36.2 C), resp. rate 16, height $RemoveBe'5\' 1"'WPmjwWpkK$  (1.549 m), weight 140 lb (63.5 kg), SpO2 93 %. Body mass index is 26.45 kg/m.     10/24/2021    9:19 AM 09/25/2021    2:31 PM 06/18/2021    3:38 PM  Vitals with BMI  Height $Remov'5\' 1"'DmgQVz$  $Remove'5\' 1"'dhiQlzN$  $RemoveB'5\' 1"'BnrWOQCH$   Weight 140 lbs 141 lbs 3 oz 140 lbs  BMI 26.47 95.09 32.67  Systolic 124 580 998  Diastolic 76 62 78  Pulse 81 84 96    Physical Exam Neck:     Vascular: No JVD.  Cardiovascular:     Rate and Rhythm: Normal rate and regular rhythm.     Pulses: Intact distal pulses.     Heart sounds: Normal heart sounds. No murmur heard.   No  gallop.  Pulmonary:     Effort: Pulmonary effort is normal.     Breath sounds: Normal breath sounds.  Abdominal:     General: Bowel sounds are normal.     Palpations: Abdomen is soft.  Musculoskeletal:     Right lower leg: No edema.     Left lower leg: No edema.    Medications and allergies   Allergies  Allergen Reactions   Nefazodone    Trazodone And Nefazodone      Medication list after today's encounter   Current Outpatient Medications:    Acetaminophen-guaiFENesin (MUCINEX COLD & FLU PO), Take by mouth., Disp: , Rfl:    amitriptyline (ELAVIL) 50 MG tablet, TAKE 2 TABLETS AT BEDTIME., Disp: 180 tablet, Rfl: 1   aspirin EC 81 MG tablet, Take 1 tablet (81 mg total) by mouth daily., Disp: 90 tablet, Rfl: 11   atorvastatin (LIPITOR) 10 MG tablet, TAKE 1 TABLET ONCE DAILY., Disp: 90 tablet, Rfl: 0   butalbital-acetaminophen-caffeine (FIORICET) 50-325-40 MG tablet, TAKE ONE TABLET BY MOUTH TWICE DAILY AS NEEDED FOR HEADACHE, Disp: 60 tablet, Rfl: 0   Calcium Carbonate-Vit D-Min (CALCIUM 1200 PO), Take 1 capsule by mouth  daily., Disp: , Rfl:    fexofenadine (ALLEGRA) 180 MG tablet, Take 180 mg by mouth daily., Disp: , Rfl:    Lactobacillus-Inulin (CULTURELLE DIGESTIVE DAILY PO), Take by mouth., Disp: , Rfl:    omeprazole (PRILOSEC) 20 MG capsule, TAKE (1) CAPSULE DAILY., Disp: 90 capsule, Rfl: 0   triamcinolone cream (KENALOG) 0.1 %, SMARTSIG:sparingly Topical Twice Daily PRN, Disp: , Rfl:    VITAMIN D PO, Take by mouth 2 (two) times daily., Disp: , Rfl:   Laboratory examination:   Recent Labs    02/20/21 1302  NA 139  K 4.6  CL 103  CO2 29  GLUCOSE 91  BUN 14  CREATININE 0.80  CALCIUM 9.7   CrCl cannot be calculated (Patient's most recent lab result is older than the maximum 21 days allowed.).     Latest Ref Rng & Units 02/20/2021    1:02 PM 02/10/2019    3:43 PM 11/17/2018    2:51 PM  CMP  Glucose 70 - 99 mg/dL 91   96   98    BUN 6 - 23 mg/dL $Remove'14   12   13    'xWjbMNF$ Creatinine 0.40 - 1.20 mg/dL 0.80   1.03   0.86    Sodium 135 - 145 mEq/L 139   141   139    Potassium 3.5 - 5.1 mEq/L 4.6   4.4   4.2    Chloride 96 - 112 mEq/L 103   105   103    CO2 19 - 32 mEq/L $Remove'29   26   29    'HQuNGYa$ Calcium 8.4 - 10.5 mg/dL 9.7   9.8   9.5    Total Protein 6.0 - 8.3 g/dL 7.5   7.6   7.0    Total Bilirubin 0.2 - 1.2 mg/dL 0.3   0.6   0.3    Alkaline Phos 39 - 117 U/L 98   75   93    AST 0 - 37 U/L $Remo'16   24   22    'hSjKq$ ALT 0 - 35 U/L $Remo'15   26   24        'XSQAV$ Latest Ref Rng & Units 02/20/2021    1:02 PM 02/10/2019    3:43 PM 11/17/2018    2:51 PM  CBC  WBC 4.0 - 10.5 K/uL 7.5   8.6   8.6    Hemoglobin 12.0 - 15.0 g/dL 13.4   14.6   13.3    Hematocrit 36.0 - 46.0 % 40.4   43.1   40.3    Platelets 150.0 - 400.0 K/uL 322.0   364   358.0      Lipid Panel Recent Labs    02/20/21 1302  CHOL 189  TRIG 223.0*  VLDL 44.6*  HDL 66.80  CHOLHDL 3  LDLDIRECT 103.0    HEMOGLOBIN A1C Lab Results  Component Value Date   HGBA1C 6.0 02/20/2021   TSH Recent Labs    02/20/21 1302  TSH 2.92    External labs:   Labs  10/04/2021: BUN 26, creatinine 0.90, EGFR 69 mL, potassium 5.4, serum glucose 100 mg.  Hb 12.9/HCT 39.3, platelets 410.  Radiology:   Chest x-ray PA and lateral view 09/27/2021: Both lungs are clear. The visualized skeletal structures are unremarkable  Cardiac Studies:    EKG:   EKG 10/24/2021: Normal sinus rhythm with rate of 80 bpm, incomplete right bundle branch block.  LVH by voltage criteria.  Nonspecific T abnormality.    Assessment     ICD-10-CM   1. Precordial chest pain  R07.2 EKG 12-Lead    PCV ECHOCARDIOGRAM COMPLETE    PCV MYOCARDIAL PERFUSION WO LEXISCAN    2. Dyspnea on exertion  R06.09 PCV ECHOCARDIOGRAM COMPLETE    PCV MYOCARDIAL PERFUSION WO LEXISCAN    3. Mixed hypercholesterolemia and hypertriglyceridemia  E78.2 Lipid Panel With LDL/HDL Ratio    Lipoprotein A (LPA)    4. Abnormal EKG  R94.31     5. Aortic atherosclerosis (HCC)  I70.0 PCV ECHOCARDIOGRAM COMPLETE    PCV MYOCARDIAL PERFUSION WO LEXISCAN       Medications Discontinued During This Encounter  Medication Reason   methenamine (HIPREX) 1 g tablet    predniSONE (DELTASONE) 10 MG tablet Completed Course    No orders of the defined types were placed in this encounter.  Orders Placed This Encounter  Procedures   Lipid Panel With LDL/HDL Ratio   Lipoprotein A (LPA)   PCV MYOCARDIAL PERFUSION WO LEXISCAN    Standing Status:   Future    Standing Expiration Date:   12/24/2021   EKG 12-Lead   PCV ECHOCARDIOGRAM COMPLETE    Standing Status:   Future    Standing Expiration Date:   10/25/2022   Recommendations:   [name] is a 71 y.o.  Caucasian female patient with mixed hyperlipidemia, prediabetes mellitus, reactive airway disease referred to me for evaluation of chest pain.  Chest pain symptoms have been ongoing for the past 4 to 6 weeks, describes them as tightness in the chest, with no radiation, sometimes with exertional activity and sometimes after having exerted for a little bit of time  and sometimes at rest.  Symptoms are atypical.  She has also had dyspnea on exertion for the past 3 to 4 months.  In review of her risk factors including hyperglycemia, mixed hyperlipidemia, aortic atherosclerosis by CT scan of the abdomen and abnormal EKG.  For now do not suspect intermediate coronary syndrome or ACS, did not make any changes to her medications, I would like to set her up for exercise nuclear stress test and echocardiogram and see her back after that.  She has mixed hyperlipidemia, and hyperglycemia, I discussed with her regarding the diet.  States that once she realized she was prediabetic, she has made  changes to her diet and is also given up on sugars.  I would like to obtain baseline lipids again.  Otherwise her physical examination is unremarkable, I will see her back after the test and make further recommendations.  Although she is not hypertensive, EKG is abnormal suggestive of LVH.  We would also like to exclude any kind of cardiomyopathy.   Adrian Prows, MD, [facility] 10/24/2021, 10:10 AM Office: [phone]

## 2021-11-12 DIAGNOSIS — E782 Mixed hyperlipidemia: Secondary | ICD-10-CM | POA: Diagnosis not present

## 2021-11-13 LAB — LIPID PANEL WITH LDL/HDL RATIO
Cholesterol, Total: 201 mg/dL — ABNORMAL HIGH (ref 100–199)
HDL: 83 mg/dL (ref >39)
LDL Chol Calc (NIH): 91 mg/dL (ref 0–99)
LDL/HDL Ratio: 1.1 ratio (ref 0.0–3.2)
Triglycerides: 162 mg/dL — ABNORMAL HIGH (ref 0–149)
VLDL Cholesterol Cal: 27 mg/dL (ref 5–40)

## 2021-11-13 LAB — LIPOPROTEIN A (LPA): Lipoprotein (a): <8.4 nmol/L (ref <75.0)

## 2021-11-21 ENCOUNTER — Ambulatory Visit: Payer: Medicare PPO

## 2021-11-21 DIAGNOSIS — R0609 Other forms of dyspnea: Secondary | ICD-10-CM | POA: Diagnosis not present

## 2021-11-21 DIAGNOSIS — I700 Atherosclerosis of aorta: Secondary | ICD-10-CM | POA: Diagnosis not present

## 2021-11-21 DIAGNOSIS — R072 Precordial pain: Secondary | ICD-10-CM | POA: Diagnosis not present

## 2021-11-22 DIAGNOSIS — Z85828 Personal history of other malignant neoplasm of skin: Secondary | ICD-10-CM | POA: Diagnosis not present

## 2021-11-22 DIAGNOSIS — D1801 Hemangioma of skin and subcutaneous tissue: Secondary | ICD-10-CM | POA: Diagnosis not present

## 2021-11-22 DIAGNOSIS — Z808 Family history of malignant neoplasm of other organs or systems: Secondary | ICD-10-CM | POA: Diagnosis not present

## 2021-11-22 DIAGNOSIS — X32XXXS Exposure to sunlight, sequela: Secondary | ICD-10-CM | POA: Diagnosis not present

## 2021-11-22 DIAGNOSIS — L570 Actinic keratosis: Secondary | ICD-10-CM | POA: Diagnosis not present

## 2021-11-22 DIAGNOSIS — L814 Other melanin hyperpigmentation: Secondary | ICD-10-CM | POA: Diagnosis not present

## 2021-11-22 DIAGNOSIS — L821 Other seborrheic keratosis: Secondary | ICD-10-CM | POA: Diagnosis not present

## 2021-11-29 ENCOUNTER — Ambulatory Visit: Payer: Medicare PPO

## 2021-11-29 DIAGNOSIS — R0609 Other forms of dyspnea: Secondary | ICD-10-CM

## 2021-11-29 DIAGNOSIS — R072 Precordial pain: Secondary | ICD-10-CM

## 2021-11-29 DIAGNOSIS — I700 Atherosclerosis of aorta: Secondary | ICD-10-CM

## 2021-12-19 ENCOUNTER — Encounter: Payer: Self-pay | Admitting: Cardiology

## 2021-12-19 ENCOUNTER — Ambulatory Visit: Payer: Medicare PPO | Admitting: Cardiology

## 2021-12-19 VITALS — BP 122/65 | HR 78 | Temp 98.0°F | Resp 16 | Ht 61.0 in | Wt 136.0 lb

## 2021-12-19 DIAGNOSIS — E782 Mixed hyperlipidemia: Secondary | ICD-10-CM

## 2021-12-19 DIAGNOSIS — R072 Precordial pain: Secondary | ICD-10-CM

## 2021-12-19 DIAGNOSIS — R0609 Other forms of dyspnea: Secondary | ICD-10-CM | POA: Diagnosis not present

## 2021-12-19 NOTE — Progress Notes (Signed)
Primary Physician/Referring:  Aliene Beams, MD  [name], female    DOB: [birth year], 71 y.o.   MRN: [id number]  Chief Complaint  Patient presents with   Results   Follow-up   HPI:    [name]  is a 71 y.o. Caucasian female patient with mixed hyperlipidemia, prediabetes mellitus, reactive airway disease referred to me for evaluation of chest pain.  Chest pain symptoms have been ongoing for the past 4 to 6 weeks, describes them as tightness in the chest, with no radiation, sometimes with exertional activity and sometimes after having exerted for a little bit of time and sometimes at rest.  Symptoms are atypical.  She has also had dyspnea on exertion for the past 3 to 4 months.  No PND or orthopnea or leg edema.  In view of her risk factors, she underwent nuclear stress test and echocardiogram and presents for follow-up.  Since last office visit she has resumed her physical activity after the stress test was performed and has been swimming to labs except for elevated heart rate no chest pain, mild dyspnea.  Overall she is feeling well.  Past Medical History:  Diagnosis Date   ALLERGIC RHINITIS    [name]    ANXIETY    Arthritis    ASTHMA    Chronic headaches    COMMON MIGRAINE    DEPRESSION    Diabetes (HCC)    DIVERTICULOSIS, COLON    GERD (gastroesophageal reflux disease) 09/26/2015   HYPERLIPIDEMIA    [name]    Mild mitral regurgitation by prior echocardiogram    Pneumonia    Past Surgical History:  Procedure Laterality Date   ABDOMINAL HYSTERECTOMY     APPENDECTOMY     CHOLECYSTECTOMY     Family History  Problem Relation Age of Onset   Heart disease Mother    Heart disease Father    Heart attack Father 60   Heart disease Brother    Breast cancer Maternal Aunt    Bone cancer Maternal Aunt    Cancer Maternal Uncle        type unknown   Leukemia Maternal Grandmother    Heart disease Paternal Grandfather     Other Son        Hypoglycemia   Colon cancer Neg Hx     Social History   Tobacco Use   Smoking status: Former    Packs/day: 1.00    Years: 20.00    Total pack years: 20.00    Types: Cigarettes    Quit date: 05/27/1992    Years since quitting: 29.5   Smokeless tobacco: Never  Substance Use Topics   Alcohol use: Yes    Comment: occasional -1 a month   Marital Status: Divorced  ROS  Review of Systems  Cardiovascular:  Positive for chest pain and dyspnea on exertion. Negative for leg swelling.   Objective  Blood pressure 122/65, pulse 78, temperature 98 F (36.7 C), resp. rate 16, height 5\' 1"  (1.549 m), weight 136 lb (61.7 kg), SpO2 96 %. Body mass index is 25.7 kg/m.     12/19/2021   10:04 AM 10/24/2021    9:19 AM 09/25/2021    2:31 PM  Vitals with BMI  Height 5\' 1"  5\' 1"  5\' 1"   Weight 136 lbs 140 lbs 141 lbs 3 oz  BMI 25.71 26.47 26.69  Systolic 122 132 11/25/2021  Diastolic 65 76 62  Pulse 78 81 84    Physical  Exam Neck:     Vascular: No JVD.  Cardiovascular:     Rate and Rhythm: Normal rate and regular rhythm.     Pulses: Intact distal pulses.     Heart sounds: Normal heart sounds. No murmur heard.    No gallop.  Pulmonary:     Effort: Pulmonary effort is normal.     Breath sounds: Normal breath sounds.  Abdominal:     General: Bowel sounds are normal.     Palpations: Abdomen is soft.  Musculoskeletal:     Right lower leg: No edema.     Left lower leg: No edema.     Medications and allergies   Allergies  Allergen Reactions   Nefazodone    Trazodone And Nefazodone      Medication list after today's encounter   Current Outpatient Medications:    Acetaminophen-guaiFENesin (MUCINEX COLD & FLU PO), Take by mouth., Disp: , Rfl:    amitriptyline (ELAVIL) 50 MG tablet, TAKE 2 TABLETS AT BEDTIME., Disp: 180 tablet, Rfl: 1   aspirin EC 81 MG tablet, Take 1 tablet (81 mg total) by mouth daily., Disp: 90 tablet, Rfl: 11   atorvastatin (LIPITOR) 10 MG tablet, TAKE 1  TABLET ONCE DAILY., Disp: 90 tablet, Rfl: 0   butalbital-acetaminophen-caffeine (FIORICET) 50-325-40 MG tablet, TAKE ONE TABLET BY MOUTH TWICE DAILY AS NEEDED FOR HEADACHE, Disp: 60 tablet, Rfl: 0   Calcium Carbonate-Vit D-Min (CALCIUM 1200 PO), Take 1 capsule by mouth daily., Disp: , Rfl:    fexofenadine (ALLEGRA) 180 MG tablet, Take 180 mg by mouth daily., Disp: , Rfl:    Lactobacillus-Inulin (CULTURELLE DIGESTIVE DAILY PO), Take by mouth., Disp: , Rfl:    omeprazole (PRILOSEC) 20 MG capsule, TAKE (1) CAPSULE DAILY., Disp: 90 capsule, Rfl: 0   triamcinolone cream (KENALOG) 0.1 %, SMARTSIG:sparingly Topical Twice Daily PRN, Disp: , Rfl:    VITAMIN D PO, Take by mouth 2 (two) times daily., Disp: , Rfl:   Laboratory examination:   Recent Labs    02/20/21 1302  NA 139  K 4.6  CL 103  CO2 29  GLUCOSE 91  BUN 14  CREATININE 0.80  CALCIUM 9.7       Latest Ref Rng & Units 02/20/2021    1:02 PM 02/10/2019    3:43 PM 11/17/2018    2:51 PM  CBC  WBC 4.0 - 10.5 K/uL 7.5  8.6  8.6   Hemoglobin 12.0 - 15.0 g/dL 13.4  14.6  13.3   Hematocrit 36.0 - 46.0 % 40.4  43.1  40.3   Platelets 150.0 - 400.0 K/uL 322.0  364  358.0     Lipid Panel Recent Labs    02/20/21 1302 11/12/21 0844  CHOL 189 201*  TRIG 223.0* 162*  LDLCALC  --  91  VLDL 44.6*  --   HDL 66.80 83  CHOLHDL 3  --   LDLDIRECT 103.0  --     HEMOGLOBIN A1C Lab Results  Component Value Date   HGBA1C 6.0 02/20/2021   TSH Recent Labs    02/20/21 1302  TSH 2.92   External labs:   Labs 10/04/2021: BUN 26, creatinine 0.90, EGFR 69 mL, potassium 5.4, serum glucose 100 mg.  Hb 12.9/HCT 39.3, platelets 410.  Radiology:   Chest x-ray PA and lateral view 09/27/2021: Both lungs are clear. The visualized skeletal structures are unremarkable  Cardiac Studies:   PCV MYOCARDIAL PERFUSION WO LEXISCAN 11/21/2021  Narrative Exercise Myoview stress test 11/21/2021: Exercise nuclear stress test  was performed using Bruce  protocol. 1 Day Rest and Stress images. Exercise time 6 minutes 52 seconds, achieved 8.38 METS, 81% APMHR. Stress ECG nondiagnostic for ischemia as target heart rate not achieved. At the current workload normal myocardial perfusion without convincing evidence of reversible myocardial ischemia or prior infarct. Normal left ventricular size, no regional wall motion abnormalities, calculated LVEF 69%. Submaximal stress test; however, at current workload low risk study. Clinical correlation required.   PCV ECHOCARDIOGRAM COMPLETE 11/29/2021  Narrative Echocardiogram 11/29/2021: Normal LV systolic function with visual EF 60-65%. Left ventricle cavity is normal in size. Normal left ventricular wall thickness. Normal global wall motion. Normal diastolic filling pattern, normal LAP. No significant valvular heart disease. No prior study for comparison.    EKG:   EKG 10/24/2021: Normal sinus rhythm with rate of 80 bpm, incomplete right bundle branch block.  LVH by voltage criteria.  Nonspecific T abnormality.    Assessment     ICD-10-CM   1. Precordial chest pain  R07.2     2. Dyspnea on exertion  R06.09     3. Mixed hypercholesterolemia and hypertriglyceridemia  E78.2        There are no discontinued medications.   No orders of the defined types were placed in this encounter.  No orders of the defined types were placed in this encounter.  Recommendations:   [name] is a 71 y.o.  Caucasian female patient with mixed hyperlipidemia, prediabetes mellitus, reactive airway disease referred to me for evaluation of chest pain.  Chest pain symptoms have been ongoing for the past 4 to 6 weeks, describes them as tightness in the chest, with no radiation, sometimes with exertional activity and sometimes after having exerted for a little bit of time and sometimes at rest.  Symptoms are atypical.  She has also had dyspnea on exertion for the past 3 to 4 months.  In view of mixed  hyperlipidemia, aortic atherosclerosis, hypertension and hyperglycemia, she underwent stress testing and echocardiogram.  Fortunately although exercise capacity was low, target heart rate was not achieved, overall low risk stress test.  She has resumed her exercise and has been swimming regularly without any chest pain.  Episodes of chest pain now appear to be mostly noncardiac could be related to either GERD or musculoskeletal.  Advised her to continue to increase her physical activity and exercise tolerance.  With regard to hyperlipidemia, since she has made lifestyle changes, triglyceride levels is improved.  Advised her to take 2 fish oil capsules with main meal of the day and also to use fiber supplement on a daily basis which will help in further improving triglycerides and also LDL.  Otherwise I did not make any changes to her medications, blood pressure is also well controlled, she will see me back on a as needed basis.  I have discussed with the patient regarding the signs and symptoms of angina pectoris, presentation and when to seek help. Diffenentiating non anginal pain discussed.    Adrian Prows, MD, [facility] 12/19/2021, 11:10 AM Office: [phone]

## 2021-12-25 DIAGNOSIS — H52203 Unspecified astigmatism, bilateral: Secondary | ICD-10-CM | POA: Diagnosis not present

## 2021-12-25 DIAGNOSIS — H5213 Myopia, bilateral: Secondary | ICD-10-CM | POA: Diagnosis not present

## 2021-12-25 DIAGNOSIS — H25813 Combined forms of age-related cataract, bilateral: Secondary | ICD-10-CM | POA: Diagnosis not present

## 2021-12-25 DIAGNOSIS — H524 Presbyopia: Secondary | ICD-10-CM | POA: Diagnosis not present

## 2022-01-08 ENCOUNTER — Other Ambulatory Visit: Payer: Self-pay | Admitting: Internal Medicine

## 2022-01-09 ENCOUNTER — Other Ambulatory Visit: Payer: Self-pay | Admitting: Family Medicine

## 2022-01-09 ENCOUNTER — Ambulatory Visit
Admission: RE | Admit: 2022-01-09 | Discharge: 2022-01-09 | Disposition: A | Discharge status: Home / Self Care | Payer: Medicare PPO | Source: Ambulatory Visit | Attending: Family Medicine | Admitting: Family Medicine

## 2022-01-09 DIAGNOSIS — R059 Cough, unspecified: Secondary | ICD-10-CM

## 2022-01-09 DIAGNOSIS — R413 Other amnesia: Secondary | ICD-10-CM | POA: Diagnosis not present

## 2022-01-09 DIAGNOSIS — Z113 Encounter for screening for infections with a predominantly sexual mode of transmission: Secondary | ICD-10-CM | POA: Diagnosis not present

## 2022-01-09 DIAGNOSIS — Z87891 Personal history of nicotine dependence: Secondary | ICD-10-CM | POA: Diagnosis not present

## 2022-01-09 DIAGNOSIS — G43009 Migraine without aura, not intractable, without status migrainosus: Secondary | ICD-10-CM | POA: Diagnosis not present

## 2022-01-09 DIAGNOSIS — E782 Mixed hyperlipidemia: Secondary | ICD-10-CM | POA: Diagnosis not present

## 2022-01-09 DIAGNOSIS — R499 Unspecified voice and resonance disorder: Secondary | ICD-10-CM | POA: Diagnosis not present

## 2022-01-09 DIAGNOSIS — R053 Chronic cough: Secondary | ICD-10-CM | POA: Diagnosis not present

## 2022-01-09 DIAGNOSIS — D729 Disorder of white blood cells, unspecified: Secondary | ICD-10-CM | POA: Diagnosis not present

## 2022-01-09 DIAGNOSIS — R7303 Prediabetes: Secondary | ICD-10-CM | POA: Diagnosis not present

## 2022-02-03 NOTE — Progress Notes (Unsigned)
Synopsis: Referred for chronic cough by Aliene Beams, MD  Subjective:   [name] GENDER: female DOB: [birth year], MRN: [id number]  No chief complaint on file.  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  Past Medical History:  Diagnosis Date   ALLERGIC RHINITIS    [name]    ANXIETY    Arthritis    ASTHMA    Chronic headaches    COMMON MIGRAINE    DEPRESSION    Diabetes ([facility])    DIVERTICULOSIS, COLON    GERD (gastroesophageal reflux disease) 09/26/2015   HYPERLIPIDEMIA    [name]    Mild mitral regurgitation by prior echocardiogram    Pneumonia      Family History  Problem Relation Age of Onset   Heart disease Mother    Heart disease Father    Heart attack Father 69   Heart disease Brother    Breast cancer Maternal Aunt    Bone cancer Maternal Aunt    Cancer Maternal Uncle        type unknown   Leukemia Maternal Grandmother    Heart disease Paternal Grandfather    Other Son        Hypoglycemia   Colon cancer Neg Hx      Past Surgical History:  Procedure Laterality Date   ABDOMINAL HYSTERECTOMY     APPENDECTOMY     CHOLECYSTECTOMY      Social History   Socioeconomic History   Marital status: Divorced    Spouse name: Not on file   Number of children: 3   Years of education: Not on file   Highest education level: Not on file  Occupational History   Occupation: retired  Tobacco Use   Smoking status: Former    Packs/day: 1.00    Years: 20.00    Total pack years: 20.00    Types: Cigarettes    Quit date: 05/27/1992    Years since quitting: 29.7   Smokeless tobacco: Never  Vaping Use   Vaping Use: Never used  Substance and Sexual Activity   Alcohol use: Yes    Comment: occasional -1 a month   Drug use: No   Sexual activity: Not on file  Other Topics Concern   Not on file  Social History Narrative    Occasional  Caffeine drinker    Social Determinants of Health   Financial Resource Strain: Not on file  Food Insecurity: Not on file  Transportation Needs: Not on file  Physical Activity: Not on file  Stress: Not on file  Social Connections: Not on file  Intimate Partner Violence: Not on file     Allergies  Allergen Reactions   Nefazodone    Trazodone And Nefazodone      Outpatient Medications Prior to Visit  Medication Sig Dispense Refill   Acetaminophen-guaiFENesin (MUCINEX COLD & FLU PO) Take by mouth.     amitriptyline (ELAVIL) 50 MG tablet TAKE 2 TABLETS AT BEDTIME. 180 tablet 1   aspirin EC 81 MG tablet Take 1 tablet (81 mg total) by mouth daily. 90 tablet 11   atorvastatin (LIPITOR) 10 MG tablet TAKE 1 TABLET ONCE DAILY. 90 tablet 0   butalbital-acetaminophen-caffeine (FIORICET) 50-325-40 MG tablet TAKE ONE TABLET BY MOUTH TWICE DAILY AS NEEDED FOR HEADACHE 60 tablet 0   Calcium Carbonate-Vit D-Min (CALCIUM 1200 PO) Take 1 capsule by mouth daily.  fexofenadine (ALLEGRA) 180 MG tablet Take 180 mg by mouth daily.     Lactobacillus-Inulin (CULTURELLE DIGESTIVE DAILY PO) Take by mouth.     omeprazole (PRILOSEC) 20 MG capsule TAKE (1) CAPSULE DAILY. 90 capsule 0   triamcinolone cream (KENALOG) 0.1 % SMARTSIG:sparingly Topical Twice Daily PRN     VITAMIN D PO Take by mouth 2 (two) times daily.     No facility-administered medications prior to visit.       Objective:   Physical Exam:  General appearance: 71 y.o., female, NAD, conversant  Eyes: anicteric sclerae; PERRL, tracking appropriately HENT: NCAT; MMM Neck: Trachea midline; no lymphadenopathy, no JVD Lungs: CTAB, no crackles, no wheeze, with normal respiratory effort CV: RRR, no murmur  Abdomen: Soft, non-tender; non-distended, BS present  Extremities: No peripheral edema, warm Skin: Normal turgor and texture; no rash Psych: Appropriate affect Neuro: Alert and oriented to person and place, no focal  deficit     There were no vitals filed for this visit.   on *** LPM *** RA BMI Readings from Last 3 Encounters:  12/19/21 25.70 kg/m  10/24/21 26.45 kg/m  09/25/21 26.68 kg/m   Wt Readings from Last 3 Encounters:  12/19/21 136 lb (61.7 kg)  10/24/21 140 lb (63.5 kg)  09/25/21 141 lb 3.2 oz (64 kg)     CBC    Component Value Date/Time   WBC 7.5 02/20/2021 1302   RBC 4.60 02/20/2021 1302   HGB 13.4 02/20/2021 1302   HCT 40.4 02/20/2021 1302   PLT 322.0 02/20/2021 1302   MCV 87.8 02/20/2021 1302   MCH 29.9 02/10/2019 1543   MCHC 33.2 02/20/2021 1302   RDW 13.4 02/20/2021 1302   LYMPHSABS 2.3 02/20/2021 1302   MONOABS 0.5 02/20/2021 1302   EOSABS 0.3 02/20/2021 1302   BASOSABS 0.1 02/20/2021 1302    ***  Chest Imaging: CXR 01/09/22 reviewed by me unremarkable  Pulmonary Functions Testing Results:    Latest Ref Rng & Units 09/05/2014    3:00 PM  PFT Results  FVC-Pre L 2.38   FVC-Predicted Pre % 83   FVC-Post L 2.38   FVC-Predicted Post % 83   Pre FEV1/FVC % % 87   Post FEV1/FCV % % 84   FEV1-Pre L 2.08   FEV1-Predicted Pre % 94   FEV1-Post L 2.01   DLCO uncorrected ml/min/mmHg 17.94   DLCO UNC% % 88   DLVA Predicted % 112   TLC L 3.87   TLC % Predicted % 84   RV % Predicted % 70    PFT 2016 reviewed by me normal    Echocardiogram:   TTE 11/2021: Normal LV systolic function with visual EF 60-65%. Left ventricle cavity  is normal in size. Normal left ventricular wall thickness. Normal global  wall motion. Normal diastolic filling pattern, normal LAP.  No significant valvular heart disease.  No prior study for comparison.  Heart Catheterization: ***    Assessment & Plan:    Plan:      Omar Person, MD  Pulmonary Critical Care 02/03/2022 2:08 PM

## 2022-02-05 ENCOUNTER — Ambulatory Visit: Payer: Medicare PPO | Admitting: Student

## 2022-02-05 ENCOUNTER — Encounter: Payer: Self-pay | Admitting: Student

## 2022-02-05 VITALS — BP 130/72 | HR 77 | Temp 98.2°F | Ht 61.0 in | Wt 137.0 lb

## 2022-02-05 DIAGNOSIS — J189 Pneumonia, unspecified organism: Secondary | ICD-10-CM | POA: Diagnosis not present

## 2022-02-05 LAB — CBC WITH DIFFERENTIAL/PLATELET
Basophils Absolute: 0 10*3/uL (ref 0.0–0.1)
Basophils Relative: 0.6 % (ref 0.0–3.0)
Eosinophils Absolute: 0.2 10*3/uL (ref 0.0–0.7)
Eosinophils Relative: 2.8 % (ref 0.0–5.0)
HCT: 38.8 % (ref 36.0–46.0)
Hemoglobin: 13 g/dL (ref 12.0–15.0)
Lymphocytes Relative: 25.2 % (ref 12.0–46.0)
Lymphs Abs: 1.9 10*3/uL (ref 0.7–4.0)
MCHC: 33.4 g/dL (ref 30.0–36.0)
MCV: 87.5 fl (ref 78.0–100.0)
Monocytes Absolute: 0.5 10*3/uL (ref 0.1–1.0)
Monocytes Relative: 6.7 % (ref 3.0–12.0)
Neutro Abs: 5 10*3/uL (ref 1.4–7.7)
Neutrophils Relative %: 64.7 % (ref 43.0–77.0)
Platelets: 319 10*3/uL (ref 150.0–400.0)
RBC: 4.44 Mil/uL (ref 3.87–5.11)
RDW: 13.9 % (ref 11.5–15.5)
WBC: 7.7 10*3/uL (ref 4.0–10.5)

## 2022-02-05 NOTE — Patient Instructions (Signed)
-   Labs today - We will schedule PFTs (breathing tests) - albuterol 1-2 puffs as needed and can use 5-20 minutes before swimming or other exercise to see if it's helpful - see you in 8 weeks!

## 2022-02-06 LAB — HIV ANTIBODY (ROUTINE TESTING W REFLEX): HIV 1&2 Ab, 4th Generation: NONREACTIVE

## 2022-02-07 LAB — IMMUNOGLOBULINS A/E/G/M, SERUM
IgA/Immunoglobulin A, Serum: 271 mg/dL (ref 87–352)
IgE (Immunoglobulin E), Serum: 147 IU/mL (ref 6–495)
IgG (Immunoglobin G), Serum: 979 mg/dL (ref 586–1602)
IgM (Immunoglobulin M), Srm: 76 mg/dL (ref 26–217)

## 2022-02-08 ENCOUNTER — Other Ambulatory Visit: Payer: Self-pay | Admitting: Internal Medicine

## 2022-02-08 NOTE — Telephone Encounter (Signed)
Please refill as per office routine med refill policy (all routine meds to be refilled for 3 mo or monthly (per pt preference) up to one year from last visit, then month to month grace period for 3 mo, then further med refills will have to be denied) ? ?

## 2022-03-20 DIAGNOSIS — Z1211 Encounter for screening for malignant neoplasm of colon: Secondary | ICD-10-CM | POA: Diagnosis not present

## 2022-03-20 DIAGNOSIS — R059 Cough, unspecified: Secondary | ICD-10-CM | POA: Diagnosis not present

## 2022-03-20 DIAGNOSIS — N390 Urinary tract infection, site not specified: Secondary | ICD-10-CM | POA: Diagnosis not present

## 2022-04-19 ENCOUNTER — Ambulatory Visit: Payer: Medicare PPO | Admitting: Student

## 2022-05-14 DIAGNOSIS — J988 Other specified respiratory disorders: Secondary | ICD-10-CM | POA: Diagnosis not present

## 2022-05-14 DIAGNOSIS — K219 Gastro-esophageal reflux disease without esophagitis: Secondary | ICD-10-CM | POA: Diagnosis not present

## 2022-05-22 NOTE — Progress Notes (Unsigned)
Synopsis: Referred for chronic cough by Aliene Beams, MD  Subjective:   [name] GENDER: female DOB: 30-Jun-1950, MRN: [id number]  No chief complaint on file.  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 Doing ok right now. Occasional DOE. Minimal cough - doesn't bother her a ton. Some sinus congestion/postnasal drainage on and off  - takes allegra prn. She does have reflux takes omeprazole 20 mg daily in the morning before breakfast. Has inhaler right now - thinks it might be flovent but she didn't end up trying it since she improved anyway.   She does think steroids during these bouts improves her symptoms.   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 Has never lived outside of [place]. Roughly 20 py quit in 1995. No vaping, MJ. Has hypoallergenic dog.    Interval HPI Normal Ig levels, HIV neg  PFTs  Otherwise pertinent review of systems is negative.   Past Medical History:  Diagnosis Date   ALLERGIC RHINITIS    [name]    ANXIETY    Arthritis    ASTHMA    Chronic headaches    COMMON MIGRAINE    DEPRESSION    Diabetes (HCC)    DIVERTICULOSIS, COLON    GERD (gastroesophageal reflux disease) 09/26/2015   HYPERLIPIDEMIA    [name]    Mild mitral regurgitation by prior echocardiogram    Pneumonia      Family History  Problem Relation Age of Onset   Heart disease Mother    Heart disease Father    Heart attack Father 33   Heart disease Brother    Breast cancer Maternal Aunt    Bone cancer Maternal Aunt    Cancer Maternal Uncle        type unknown   Leukemia Maternal Grandmother     Heart disease Paternal Grandfather    Other Son        Hypoglycemia   Colon cancer Neg Hx      Past Surgical History:  Procedure Laterality Date   ABDOMINAL HYSTERECTOMY     APPENDECTOMY     CHOLECYSTECTOMY      Social History   Socioeconomic History   Marital status: Divorced    Spouse name: Not on file   Number of children: 3   Years of education: Not on file   Highest education level: Not on file  Occupational History   Occupation: retired  Tobacco Use   Smoking status: Former    Packs/day: 1.00    Years: 20.00    Total pack years: 20.00    Types: Cigarettes    Quit date: 05/27/1993    Years since quitting: 29.0   Smokeless tobacco: Never  Vaping Use   Vaping Use: Never used  Substance and Sexual Activity   Alcohol use: Yes    Comment: occasional -1 a month   Drug use: No   Sexual activity: Not on file  Other Topics Concern   Not on file  Social History Narrative   Occasional  Caffeine drinker    Social Determinants of Health   Financial Resource Strain: Not on file  Food Insecurity: Not on  file  Transportation Needs: Not on file  Physical Activity: Not on file  Stress: Not on file  Social Connections: Not on file  Intimate Partner Violence: Not on file     Allergies  Allergen Reactions   Nefazodone    Trazodone And Nefazodone      Outpatient Medications Prior to Visit  Medication Sig Dispense Refill   Acetaminophen-guaiFENesin (MUCINEX COLD & FLU PO) Take by mouth as needed.     amitriptyline (ELAVIL) 50 MG tablet TAKE 2 TABLETS AT BEDTIME. 180 tablet 1   aspirin EC 81 MG tablet Take 1 tablet (81 mg total) by mouth daily. 90 tablet 11   atorvastatin (LIPITOR) 10 MG tablet TAKE 1 TABLET ONCE DAILY. 90 tablet 0   butalbital-acetaminophen-caffeine (FIORICET) 50-325-40 MG tablet TAKE ONE TABLET BY MOUTH TWICE DAILY AS NEEDED FOR HEADACHE 60 tablet 0   Calcium Carbonate-Vit D-Min (CALCIUM 1200 PO) Take 1 capsule by mouth daily.     fexofenadine  (ALLEGRA) 180 MG tablet Take 180 mg by mouth daily.     Lactobacillus-Inulin (CULTURELLE DIGESTIVE DAILY PO) Take by mouth.     omeprazole (PRILOSEC) 20 MG capsule TAKE ONE CAPSULE BEFORE MORNING MEAL ONCE DAILY. 90 capsule 1   triamcinolone cream (KENALOG) 0.1 % SMARTSIG:sparingly Topical Twice Daily PRN     No facility-administered medications prior to visit.       Objective:   Physical Exam:  General appearance: 71 y.o., female, NAD, conversant  Eyes: anicteric sclerae; PERRL, tracking appropriately HENT: NCAT; MMM Neck: Trachea midline; no lymphadenopathy, no JVD Lungs: CTAB, no crackles, no wheeze, with normal respiratory effort CV: RRR, no murmur  Abdomen: Soft, non-tender; non-distended, BS present  Extremities: No peripheral edema, warm Skin: Normal turgor and texture; no rash Psych: Appropriate affect Neuro: Alert and oriented to person and place, no focal deficit     There were no vitals filed for this visit.    on RA BMI Readings from Last 3 Encounters:  02/05/22 25.89 kg/m  12/19/21 25.70 kg/m  10/24/21 26.45 kg/m   Wt Readings from Last 3 Encounters:  02/05/22 137 lb (62.1 kg)  12/19/21 136 lb (61.7 kg)  10/24/21 140 lb (63.5 kg)     CBC    Component Value Date/Time   WBC 7.7 02/05/2022 1119   RBC 4.44 02/05/2022 1119   HGB 13.0 02/05/2022 1119   HCT 38.8 02/05/2022 1119   PLT 319.0 02/05/2022 1119   MCV 87.5 02/05/2022 1119   MCH 29.9 02/10/2019 1543   MCHC 33.4 02/05/2022 1119   RDW 13.9 02/05/2022 1119   LYMPHSABS 1.9 02/05/2022 1119   MONOABS 0.5 02/05/2022 1119   EOSABS 0.2 02/05/2022 1119   BASOSABS 0.0 02/05/2022 1119    Chest Imaging: CXR 01/09/22 reviewed by me unremarkable  Pulmonary Functions Testing Results:    Latest Ref Rng & Units 09/05/2014    3:00 PM  PFT Results  FVC-Pre L 2.38   FVC-Predicted Pre % 83   FVC-Post L 2.38   FVC-Predicted Post % 83   Pre FEV1/FVC % % 87   Post FEV1/FCV % % 84   FEV1-Pre L 2.08    FEV1-Predicted Pre % 94   FEV1-Post L 2.01   DLCO uncorrected ml/min/mmHg 17.94   DLCO UNC% % 88   DLVA Predicted % 112   TLC L 3.87   TLC % Predicted % 84   RV % Predicted % 70    PFT 2016 reviewed by me normal  Echocardiogram:   TTE 11/2021: Normal LV systolic function with visual EF 60-65%. Left ventricle cavity  is normal in size. Normal left ventricular wall thickness. Normal global  wall motion. Normal diastolic filling pattern, normal LAP.  No significant valvular heart disease.  No prior study for comparison.     Assessment & Plan:   # recurrent bronchopneumonia/bronchitis  Plan: - Ig levels, HIV - We will schedule PFTs (breathing tests) - albuterol 1-2 puffs as needed and can use 5-20 minutes before swimming or other exercise to see if it's helpful - see you in 8 weeks!     Omar Person, MD [facility] 05/22/2022 5:48 PM

## 2022-05-23 ENCOUNTER — Ambulatory Visit: Payer: Medicare PPO | Admitting: Student

## 2022-05-23 ENCOUNTER — Encounter: Payer: Self-pay | Admitting: Student

## 2022-05-23 ENCOUNTER — Ambulatory Visit (INDEPENDENT_AMBULATORY_CARE_PROVIDER_SITE_OTHER): Payer: Medicare PPO | Admitting: Student

## 2022-05-23 VITALS — BP 108/58 | HR 80 | Temp 97.7°F | Ht 61.0 in | Wt 137.0 lb

## 2022-05-23 DIAGNOSIS — J449 Chronic obstructive pulmonary disease, unspecified: Secondary | ICD-10-CM

## 2022-05-23 DIAGNOSIS — J189 Pneumonia, unspecified organism: Secondary | ICD-10-CM

## 2022-05-23 DIAGNOSIS — R053 Chronic cough: Secondary | ICD-10-CM | POA: Diagnosis not present

## 2022-05-23 LAB — PULMONARY FUNCTION TEST
DL/VA % pred: 101 %
DL/VA: 4.3 ml/min/mmHg/L
DLCO cor % pred: 95 %
DLCO cor: 16.73 ml/min/mmHg
DLCO unc % pred: 95 %
DLCO unc: 16.73 ml/min/mmHg
FEF 25-75 Post: 2.14 L/sec
FEF 25-75 Pre: 1.56 L/sec
FEF2575-%Change-Post: 37 %
FEF2575-%Pred-Post: 123 %
FEF2575-%Pred-Pre: 90 %
FEV1-%Change-Post: 4 %
FEV1-%Pred-Post: 93 %
FEV1-%Pred-Pre: 89 %
FEV1-Post: 1.86 L
FEV1-Pre: 1.78 L
FEV1FVC-%Change-Post: 2 %
FEV1FVC-%Pred-Pre: 106 %
FEV6-%Change-Post: 0 %
FEV6-%Pred-Post: 88 %
FEV6-%Pred-Pre: 87 %
FEV6-Post: 2.23 L
FEV6-Pre: 2.21 L
FEV6FVC-%Change-Post: 0 %
FEV6FVC-%Pred-Post: 103 %
FEV6FVC-%Pred-Pre: 104 %
FVC-%Change-Post: 1 %
FVC-%Pred-Post: 85 %
FVC-%Pred-Pre: 83 %
FVC-Post: 2.25 L
FVC-Pre: 2.21 L
Post FEV1/FVC ratio: 83 %
Post FEV6/FVC ratio: 99 %
Pre FEV1/FVC ratio: 81 %
Pre FEV6/FVC Ratio: 100 %
RV % pred: 2 %
RV: 0.05 L
TLC % pred: 83 %
TLC: 3.83 L

## 2022-05-23 MED ORDER — BREZTRI AEROSPHERE 160-9-4.8 MCG/ACT IN AERO: 2.0000 | INHALATION_SPRAY | Freq: Two times a day (BID) | RESPIRATORY_TRACT | 0 refills | Status: AC

## 2022-05-23 NOTE — Patient Instructions (Signed)
-   High resolution CT Chest to look for bronchiectasis - send me my chart message or call once this is done and I'm glad to review results with you - would try breztri 2 puffs twice daily, rinse mouth after use, I will check into least expensive inhaler option long term for you - albuterol inhaler as needed - this is rescue inhaler - see you in 3 months to see how inhaler is working for you - if productive cough no better after trial of this inhaler and restarting exercise, call back and we can consider a round of antibiotics.

## 2022-05-23 NOTE — Patient Instructions (Signed)
Full PFT performed today. °

## 2022-05-23 NOTE — Progress Notes (Signed)
Full PFT performed today. °

## 2022-05-29 ENCOUNTER — Ambulatory Visit (HOSPITAL_COMMUNITY)
Admission: RE | Admit: 2022-05-29 | Discharge: 2022-05-29 | Disposition: A | Discharge status: Home / Self Care | Payer: Medicare PPO | Source: Ambulatory Visit | Attending: Student | Admitting: Student

## 2022-05-29 DIAGNOSIS — I700 Atherosclerosis of aorta: Secondary | ICD-10-CM | POA: Diagnosis not present

## 2022-05-29 DIAGNOSIS — R053 Chronic cough: Secondary | ICD-10-CM | POA: Insufficient documentation

## 2022-05-29 DIAGNOSIS — J479 Bronchiectasis, uncomplicated: Secondary | ICD-10-CM | POA: Diagnosis not present

## 2022-05-31 DIAGNOSIS — J069 Acute upper respiratory infection, unspecified: Secondary | ICD-10-CM | POA: Diagnosis not present

## 2022-05-31 DIAGNOSIS — U071 COVID-19: Secondary | ICD-10-CM | POA: Diagnosis not present

## 2022-06-21 DIAGNOSIS — U071 COVID-19: Secondary | ICD-10-CM | POA: Diagnosis not present

## 2022-06-21 DIAGNOSIS — R059 Cough, unspecified: Secondary | ICD-10-CM | POA: Diagnosis not present

## 2022-07-16 ENCOUNTER — Encounter: Payer: Self-pay | Admitting: Gastroenterology

## 2022-08-09 ENCOUNTER — Telehealth: Payer: Self-pay | Admitting: Student

## 2022-08-09 NOTE — Telephone Encounter (Signed)
Hello again Dr. Verlee Monte-   PT states she did not went to set up a follow up appt. The AVS said FU was to insure meds were working well for her. She said they were working fine and she was feeling good. OK to read and sign. Thank you, sir.

## 2022-08-12 NOTE — Telephone Encounter (Signed)
Ok no prob

## 2022-08-13 ENCOUNTER — Emergency Department (HOSPITAL_COMMUNITY): Payer: Medicare PPO

## 2022-08-13 ENCOUNTER — Emergency Department (HOSPITAL_COMMUNITY)
Admission: EM | Admit: 2022-08-13 | Discharge: 2022-08-13 | Disposition: A | Discharge status: Home / Self Care | Payer: Medicare PPO | Attending: Emergency Medicine | Admitting: Emergency Medicine

## 2022-08-13 ENCOUNTER — Other Ambulatory Visit: Payer: Self-pay

## 2022-08-13 ENCOUNTER — Encounter (HOSPITAL_COMMUNITY): Payer: Self-pay

## 2022-08-13 DIAGNOSIS — S199XXA Unspecified injury of neck, initial encounter: Secondary | ICD-10-CM | POA: Diagnosis not present

## 2022-08-13 DIAGNOSIS — R079 Chest pain, unspecified: Secondary | ICD-10-CM | POA: Diagnosis not present

## 2022-08-13 DIAGNOSIS — M546 Pain in thoracic spine: Secondary | ICD-10-CM | POA: Insufficient documentation

## 2022-08-13 DIAGNOSIS — Z7982 Long term (current) use of aspirin: Secondary | ICD-10-CM | POA: Diagnosis not present

## 2022-08-13 DIAGNOSIS — Y92410 Unspecified street and highway as the place of occurrence of the external cause: Secondary | ICD-10-CM | POA: Diagnosis not present

## 2022-08-13 DIAGNOSIS — R519 Headache, unspecified: Secondary | ICD-10-CM | POA: Diagnosis not present

## 2022-08-13 DIAGNOSIS — S0990XA Unspecified injury of head, initial encounter: Secondary | ICD-10-CM | POA: Diagnosis not present

## 2022-08-13 DIAGNOSIS — R0789 Other chest pain: Secondary | ICD-10-CM | POA: Diagnosis not present

## 2022-08-13 MED ORDER — ACETAMINOPHEN 500 MG PO TABS
1000.0000 mg | ORAL_TABLET | Freq: Once | ORAL | Status: AC
  Administered 2022-08-13: 1000 mg via ORAL

## 2022-08-13 MED ORDER — ACETAMINOPHEN 500 MG PO TABS
ORAL_TABLET | ORAL | Status: AC
  Filled 2022-08-13: qty 2
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
## 2022-08-13 NOTE — Discharge Instructions (Addendum)
Return for any problem.  ?
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
## 2022-08-19 ENCOUNTER — Telehealth: Payer: Self-pay

## 2022-08-19 NOTE — Telephone Encounter (Signed)
        Patient  visited [facility] on 3/19     Telephone encounter attempt :  1st  A HIPAA compliant voice message was left requesting a return call.  Instructed patient to call back   [facility] [phone] [address], [place], [place] [address] Phone: [phone] Email: [email or web address]

## 2022-08-20 ENCOUNTER — Telehealth: Payer: Self-pay

## 2022-08-20 NOTE — Telephone Encounter (Signed)
        Patient  visited [facility] on 3/18    Telephone encounter attempt :  2nd  A HIPAA compliant voice message was left requesting a return call.  Instructed patient to call back    [facility] [phone] [address], [place], [place] [address] Phone: [phone] Email: [email or web address]

## 2022-09-26 DIAGNOSIS — G43009 Migraine without aura, not intractable, without status migrainosus: Secondary | ICD-10-CM | POA: Diagnosis not present

## 2022-09-26 DIAGNOSIS — Z87828 Personal history of other (healed) physical injury and trauma: Secondary | ICD-10-CM | POA: Diagnosis not present

## 2022-09-26 DIAGNOSIS — F0781 Postconcussional syndrome: Secondary | ICD-10-CM | POA: Diagnosis not present

## 2022-10-28 DIAGNOSIS — R7303 Prediabetes: Secondary | ICD-10-CM | POA: Diagnosis not present

## 2022-10-28 DIAGNOSIS — E782 Mixed hyperlipidemia: Secondary | ICD-10-CM | POA: Diagnosis not present

## 2022-10-28 DIAGNOSIS — G43009 Migraine without aura, not intractable, without status migrainosus: Secondary | ICD-10-CM | POA: Diagnosis not present

## 2022-10-28 DIAGNOSIS — F0781 Postconcussional syndrome: Secondary | ICD-10-CM | POA: Diagnosis not present

## 2022-10-28 DIAGNOSIS — G4700 Insomnia, unspecified: Secondary | ICD-10-CM | POA: Diagnosis not present

## 2022-10-28 DIAGNOSIS — K219 Gastro-esophageal reflux disease without esophagitis: Secondary | ICD-10-CM | POA: Diagnosis not present

## 2022-11-13 DIAGNOSIS — H43392 Other vitreous opacities, left eye: Secondary | ICD-10-CM | POA: Diagnosis not present

## 2022-12-09 DIAGNOSIS — M2569 Stiffness of other specified joint, not elsewhere classified: Secondary | ICD-10-CM | POA: Diagnosis not present

## 2022-12-09 DIAGNOSIS — M6281 Muscle weakness (generalized): Secondary | ICD-10-CM | POA: Diagnosis not present

## 2022-12-09 DIAGNOSIS — R293 Abnormal posture: Secondary | ICD-10-CM | POA: Diagnosis not present

## 2022-12-09 DIAGNOSIS — M542 Cervicalgia: Secondary | ICD-10-CM | POA: Diagnosis not present

## 2022-12-11 DIAGNOSIS — M542 Cervicalgia: Secondary | ICD-10-CM | POA: Diagnosis not present

## 2022-12-11 DIAGNOSIS — M6281 Muscle weakness (generalized): Secondary | ICD-10-CM | POA: Diagnosis not present

## 2022-12-11 DIAGNOSIS — R293 Abnormal posture: Secondary | ICD-10-CM | POA: Diagnosis not present

## 2022-12-11 DIAGNOSIS — M2569 Stiffness of other specified joint, not elsewhere classified: Secondary | ICD-10-CM | POA: Diagnosis not present

## 2022-12-18 DIAGNOSIS — R293 Abnormal posture: Secondary | ICD-10-CM | POA: Diagnosis not present

## 2022-12-18 DIAGNOSIS — M542 Cervicalgia: Secondary | ICD-10-CM | POA: Diagnosis not present

## 2022-12-18 DIAGNOSIS — M6281 Muscle weakness (generalized): Secondary | ICD-10-CM | POA: Diagnosis not present

## 2022-12-18 DIAGNOSIS — M2569 Stiffness of other specified joint, not elsewhere classified: Secondary | ICD-10-CM | POA: Diagnosis not present

## 2023-01-01 ENCOUNTER — Ambulatory Visit (AMBULATORY_SURGERY_CENTER): Payer: Medicare PPO | Admitting: *Deleted

## 2023-01-01 VITALS — Ht 61.0 in | Wt 137.0 lb

## 2023-01-01 DIAGNOSIS — Z1211 Encounter for screening for malignant neoplasm of colon: Secondary | ICD-10-CM

## 2023-01-01 MED ORDER — NA SULFATE-K SULFATE-MG SULF 17.5-3.13-1.6 GM/177ML PO SOLN: 1.0000 | Freq: Once | ORAL | 0 refills | Status: AC

## 2023-01-01 NOTE — Progress Notes (Signed)
Pt's name and DOB verified at the beginning of the pre-visit.  Pt denies any difficulty with ambulating,sitting, laying down or rolling side to side Gave both [facility] main # and MD on call # prior to instructions.  No egg or soy allergy known to patient  No issues known to pt with past sedation with any surgeries or procedures Pt denies having issues being intubated Pt has no issues moving head neck or swallowing No FH of Malignant Hyperthermia Pt is not on diet pills Pt is not on home 02  Pt is not on blood thinners  Pt denies issues with constipation  Pt is not on dialysis Pt denise any abnormal heart rhythms  Pt denies any upcoming cardiac testing Pt encouraged to use to use Singlecare or Goodrx to reduce cost  Patient's chart reviewed by Cathlyn Parsons CNRA prior to pre-visit and patient appropriate for the [facility].  Pre-visit completed and red dot placed by patient's name on their procedure day (on provider's schedule).  . Visit by phone Pt states weight is 137 Instructed pt why it is important to and  to call if they have any changes in health or new medications. Directed them to the # given and on instructions.   Pt states they will.  Instructions reviewed with pt and pt states understanding. Instructed to review again prior to procedure. Pt states they will.  Instructions sent by mail with coupon and by my chart

## 2023-01-14 ENCOUNTER — Encounter: Payer: Self-pay | Admitting: Gastroenterology

## 2023-01-28 NOTE — Telephone Encounter (Signed)
ENCOUNTER OPENED IN ERROR

## 2023-01-29 ENCOUNTER — Encounter: Payer: Self-pay | Admitting: Gastroenterology

## 2023-01-29 ENCOUNTER — Ambulatory Visit (AMBULATORY_SURGERY_CENTER): Payer: Medicare PPO | Admitting: Gastroenterology

## 2023-01-29 VITALS — BP 124/88 | HR 72 | Temp 97.7°F | Resp 17 | Ht 61.0 in | Wt 137.0 lb

## 2023-01-29 DIAGNOSIS — Z1211 Encounter for screening for malignant neoplasm of colon: Secondary | ICD-10-CM | POA: Diagnosis not present

## 2023-01-29 DIAGNOSIS — F419 Anxiety disorder, unspecified: Secondary | ICD-10-CM | POA: Diagnosis not present

## 2023-01-29 DIAGNOSIS — D125 Benign neoplasm of sigmoid colon: Secondary | ICD-10-CM | POA: Diagnosis not present

## 2023-01-29 DIAGNOSIS — J45909 Unspecified asthma, uncomplicated: Secondary | ICD-10-CM | POA: Diagnosis not present

## 2023-01-29 DIAGNOSIS — E119 Type 2 diabetes mellitus without complications: Secondary | ICD-10-CM | POA: Diagnosis not present

## 2023-01-29 DIAGNOSIS — K635 Polyp of colon: Secondary | ICD-10-CM | POA: Diagnosis not present

## 2023-01-29 DIAGNOSIS — F32A Depression, unspecified: Secondary | ICD-10-CM | POA: Diagnosis not present

## 2023-01-29 MED ORDER — SODIUM CHLORIDE 0.9 % IV SOLN: 500.0000 mL | Freq: Once | INTRAVENOUS | Status: DC

## 2023-01-29 NOTE — Progress Notes (Signed)
Pt's states no medical or surgical changes since previsit or office visit. 

## 2023-01-29 NOTE — Progress Notes (Signed)
Vss nad trans to pacu 

## 2023-01-29 NOTE — Op Note (Signed)
[facility] Patient Name: [name] Procedure Date: 01/29/2023 8:25 AM MRN: [id number] Endoscopist: Meryl Dare , MD, [phone] Age: 72 Referring MD:  Date of Birth: January 26, 1951 Gender: Female Account #: [id number] Procedure:                Colonoscopy Indications:              Screening for colorectal malignant neoplasm Medicines:                Monitored Anesthesia Care Procedure:                Pre-Anesthesia Assessment:                           - Prior to the procedure, a History and Physical                            was performed, and patient medications and                            allergies were reviewed. The patient's tolerance of                            previous anesthesia was also reviewed. The risks                            and benefits of the procedure and the sedation                            options and risks were discussed with the patient.                            All questions were answered, and informed consent                            was obtained. Prior Anticoagulants: The patient has                            taken no anticoagulant or antiplatelet agents. ASA                            Grade Assessment: III - A patient with severe                            systemic disease. After reviewing the risks and                            benefits, the patient was deemed in satisfactory                            condition to undergo the procedure.                           After obtaining informed consent, the colonoscope  was passed under direct vision. Throughout the                            procedure, the patient's blood pressure, pulse, and                            oxygen saturations were monitored continuously. The                            CF [id number] #[id number] was introduced through the anus                            and advanced to the the cecum, identified by                            appendiceal  orifice and ileocecal valve. The                            ileocecal valve, appendiceal orifice, and rectum                            were photographed. The quality of the bowel                            preparation was good. The colonoscopy was performed                            without difficulty. The patient tolerated the                            procedure well. Scope In: 8:29:52 AM Scope Out: 8:45:30 AM Scope Withdrawal Time: 0 hours 10 minutes 48 seconds  Total Procedure Duration: 0 hours 15 minutes 38 seconds  Findings:                 The perianal and digital rectal examinations were                            normal.                           A 7 mm polyp was found in the distal sigmoid colon.                            The polyp was sessile. The polyp was removed with a                            cold snare. Resection and retrieval were complete.                           Multiple medium-mouthed and small-mouthed                            diverticula were found in the left colon. There was  narrowing of the colon in association with the                            diverticular opening. There was evidence of                            diverticular spasm. There was evidence of an                            impacted diverticulum. There was no evidence of                            diverticular bleeding.                           The exam was otherwise without abnormality on                            direct and retroflexion views. Complications:            No immediate complications. Estimated blood loss:                            None. Estimated Blood Loss:     Estimated blood loss: none. Impression:               - One 7 mm polyp in the distal sigmoid colon,                            removed with a cold snare. Resected and retrieved.                           - Moderate diverticulosis in the left colon.                           - The  examination was otherwise normal on direct                            and retroflexion views. Recommendation:           - Repeat colonoscopy vs no repeat due to age after                            studies are complete for surveillance based on                            pathology results.                           - Patient has a contact number available for                            emergencies. The signs and symptoms of potential                            delayed complications were discussed  with the                            patient. Return to normal activities tomorrow.                            Written discharge instructions were provided to the                            patient.                           - High fiber diet.                           - Continue present medications.                           - Await pathology results. Meryl Dare, MD 01/29/2023 8:50:48 AM This report has been signed electronically.

## 2023-01-29 NOTE — Patient Instructions (Addendum)
High fiber diet. Continue present medications. Await pathology results.   YOU HAD AN ENDOSCOPIC PROCEDURE TODAY AT THE [facility]:   Refer to the procedure report that was given to you for any specific questions about what was found during the examination.  If the procedure report does not answer your questions, please call your gastroenterologist to clarify.  If you requested that your care partner not be given the details of your procedure findings, then the procedure report has been included in a sealed envelope for you to review at your convenience later.  YOU SHOULD EXPECT: Some feelings of bloating in the abdomen. Passage of more gas than usual.  Walking can help get rid of the air that was put into your GI tract during the procedure and reduce the bloating. If you had a lower endoscopy (such as a colonoscopy or flexible sigmoidoscopy) you may notice spotting of blood in your stool or on the toilet paper. If you underwent a bowel prep for your procedure, you may not have a normal bowel movement for a few days.  Please Note:  You might notice some irritation and congestion in your nose or some drainage.  This is from the oxygen used during your procedure.  There is no need for concern and it should clear up in a day or so.  SYMPTOMS TO REPORT IMMEDIATELY:  Following lower endoscopy (colonoscopy or flexible sigmoidoscopy):  Excessive amounts of blood in the stool  Significant tenderness or worsening of abdominal pains  Swelling of the abdomen that is new, acute  Fever of 100F or higher For urgent or emergent issues, a gastroenterologist can be reached at any hour by calling [phone]. Do not use MyChart messaging for urgent concerns.    DIET:  We do recommend a small meal at first, but then you may proceed to your regular diet.  Drink plenty of fluids but you should avoid alcoholic beverages for 24 hours.  ACTIVITY:  You should plan to take it easy for the rest of  today and you should NOT DRIVE or use heavy machinery until tomorrow (because of the sedation medicines used during the test).    FOLLOW UP: Our staff will call the number listed on your records the next business day following your procedure.  We will call around 7:15- 8:00 am to check on you and address any questions or concerns that you may have regarding the information given to you following your procedure. If we do not reach you, we will leave a message.     If any biopsies were taken you will be contacted by phone or by letter within the next 1-3 weeks.  Please call us at [phone] if you have not heard about the biopsies in 3 weeks.    SIGNATURES/CONFIDENTIALITY: You and/or your care partner have signed paperwork which will be entered into your electronic medical record.  These signatures attest to the fact that that the information above on your After Visit Summary has been reviewed and is understood.  Full responsibility of the confidentiality of this discharge information lies with you and/or your care-partner.

## 2023-01-29 NOTE — Progress Notes (Signed)
Called to room to assist during endoscopic procedure.  Patient ID and intended procedure confirmed with present staff. Received instructions for my participation in the procedure from the performing physician.  

## 2023-01-29 NOTE — Progress Notes (Signed)
History & Physical  Primary Care Physician:  Aliene Beams, MD Primary Gastroenterologist: Claudette Head, MD  Impression / Plan:  Average risk CRC screening for colonoscopy  CHIEF COMPLAINT:  CRC screening   HPI: [name] is a 72 y.o. female average risk CRC screening for colonoscopy.    Past Medical History:  Diagnosis Date   ALLERGIC RHINITIS    [name]    ANXIETY    Arthritis    ASTHMA    Chronic headaches    COMMON MIGRAINE    COPD (chronic obstructive pulmonary disease) (HCC)    Mild   DEPRESSION    Diabetes (HCC)    DIVERTICULOSIS, COLON    GERD (gastroesophageal reflux disease) 09/26/2015   HYPERLIPIDEMIA    [name]    Mild mitral regurgitation by prior echocardiogram    Osteoporosis    Pneumonia     Past Surgical History:  Procedure Laterality Date   ABDOMINAL HYSTERECTOMY     APPENDECTOMY     CHOLECYSTECTOMY     COLONOSCOPY      Prior to Admission medications   Medication Sig Start Date End Date Taking? Authorizing Provider  amitriptyline (ELAVIL) 50 MG tablet TAKE 2 TABLETS AT BEDTIME. 03/20/21  Yes Corwin Levins, MD  atorvastatin (LIPITOR) 10 MG tablet TAKE 1 TABLET ONCE DAILY. 11/30/19  Yes Corwin Levins, MD  Budeson-Glycopyrrol-Formoterol (BREZTRI AEROSPHERE) 160-9-4.8 MCG/ACT AERO Inhale 2 puffs into the lungs in the morning and at bedtime. 05/23/22  Yes Omar Person, MD  butalbital-acetaminophen-caffeine (FIORICET) [phone] MG tablet TAKE ONE TABLET BY MOUTH TWICE DAILY AS NEEDED FOR HEADACHE 01/08/22  Yes Corwin Levins, MD  Calcium Carbonate-Vit D-Min (CALCIUM 1200 PO) Take 1 capsule by mouth daily.   Yes [provider]  Lactobacillus-Inulin (CULTURELLE DIGESTIVE DAILY PO) Take by mouth.   Yes [provider]  Omega-3 Fatty Acids (FISH OIL) 1000 MG CAPS Take by mouth. Take 1400   Yes [provider]  omeprazole (PRILOSEC) 20 MG capsule TAKE ONE CAPSULE BEFORE MORNING MEAL  ONCE DAILY. 02/08/22  Yes Corwin Levins, MD  Acetaminophen-guaiFENesin [facility] COLD & FLU PO) Take by mouth as needed. Patient not taking: Reported on 01/01/2023    [provider]  Albuterol Sulfate (PROAIR RESPICLICK) 108 (90 Base) MCG/ACT AEPB Inhale 2 puffs into the lungs every 4 (four) hours as needed.    [provider]  aspirin EC 81 MG tablet Take 1 tablet (81 mg total) by mouth daily. 02/24/14   Corwin Levins, MD  fexofenadine (ALLEGRA) 180 MG tablet Take 180 mg by mouth daily. Patient not taking: Reported on 01/01/2023    [provider]  HYDROcodone bit-homatropine (HYCODAN) 5-1.5 MG/5ML syrup  11/19/18   [provider]  triamcinolone cream (KENALOG) 0.1 % SMARTSIG:sparingly Topical Twice Daily PRN Patient not taking: Reported on 01/01/2023 07/26/21   [provider]    Current Outpatient Medications  Medication Sig Dispense Refill   amitriptyline (ELAVIL) 50 MG tablet TAKE 2 TABLETS AT BEDTIME. 180 tablet 1   atorvastatin (LIPITOR) 10 MG tablet TAKE 1 TABLET ONCE DAILY. 90 tablet 0   Budeson-Glycopyrrol-Formoterol (BREZTRI AEROSPHERE) 160-9-4.8 MCG/ACT AERO Inhale 2 puffs into the lungs in the morning and at bedtime. 5.9 g 0   butalbital-acetaminophen-caffeine (FIORICET) 50-325-40 MG tablet TAKE ONE TABLET BY MOUTH TWICE DAILY AS NEEDED FOR HEADACHE 60 tablet 0   Calcium Carbonate-Vit D-Min (CALCIUM 1200 PO) Take 1 capsule by mouth daily.  Lactobacillus-Inulin (CULTURELLE DIGESTIVE DAILY PO) Take by mouth.     Omega-3 Fatty Acids (FISH OIL) 1000 MG CAPS Take by mouth. Take 1400     omeprazole (PRILOSEC) 20 MG capsule TAKE ONE CAPSULE BEFORE MORNING MEAL ONCE DAILY. 90 capsule 1   Acetaminophen-guaiFENesin (MUCINEX COLD & FLU PO) Take by mouth as needed. (Patient not taking: Reported on 01/01/2023)     Albuterol Sulfate (PROAIR RESPICLICK) 108 (90 Base) MCG/ACT AEPB Inhale 2 puffs into the lungs every 4 (four) hours as needed.     aspirin EC 81  MG tablet Take 1 tablet (81 mg total) by mouth daily. 90 tablet 11   fexofenadine (ALLEGRA) 180 MG tablet Take 180 mg by mouth daily. (Patient not taking: Reported on 01/01/2023)     HYDROcodone bit-homatropine (HYCODAN) 5-1.5 MG/5ML syrup      triamcinolone cream (KENALOG) 0.1 % SMARTSIG:sparingly Topical Twice Daily PRN (Patient not taking: Reported on 01/01/2023)     Current Facility-Administered Medications  Medication Dose Route Frequency Provider Last Rate Last Admin   0.9 %  sodium chloride infusion  500 mL Intravenous Once Meryl Dare, MD        Allergies as of 01/29/2023 - Review Complete 01/29/2023  Allergen Reaction Noted   Nefazodone  03/08/2014   Trazodone and nefazodone  01/24/2011    Family History  Problem Relation Age of Onset   Heart disease Mother    Heart disease Father    Heart attack Father 32   Heart disease Brother    Breast cancer Maternal Aunt    Bone cancer Maternal Aunt    Cancer Maternal Uncle        type unknown   Leukemia Maternal Grandmother    Heart disease Paternal Grandfather    Other Son        Hypoglycemia   Colon cancer Neg Hx    Colon polyps Neg Hx    Esophageal cancer Neg Hx    Stomach cancer Neg Hx    Rectal cancer Neg Hx     Social History   Socioeconomic History   Marital status: Divorced    Spouse name: Not on file   Number of children: 3   Years of education: Not on file   Highest education level: Not on file  Occupational History   Occupation: retired  Tobacco Use   Smoking status: Former    Current packs/day: 0.00    Average packs/day: 1 pack/day for 20.0 years (20.0 ttl pk-yrs)    Types: Cigarettes    Start date: 05/27/1973    Quit date: 05/27/1993    Years since quitting: 29.6   Smokeless tobacco: Never  Vaping Use   Vaping status: Never Used  Substance and Sexual Activity   Alcohol use: Yes    Comment: occasional -1 a month   Drug use: No   Sexual activity: Not on file  Other Topics Concern   Not on file   Social History Narrative   Occasional  Caffeine drinker    Social Determinants of Health   Financial Resource Strain: Not on file  Food Insecurity: Not on file  Transportation Needs: Not on file  Physical Activity: Not on file  Stress: Not on file  Social Connections: Not on file  Intimate Partner Violence: Not on file    Review of Systems:  All systems reviewed were negative except where noted in HPI.   Physical Exam:  General:  Alert, well-developed, in NAD Head:  Normocephalic and atraumatic. Eyes:  Sclera clear, no icterus.   Conjunctiva pink. Ears:  Normal auditory acuity. Mouth:  No deformity or lesions.  Neck:  Supple; no masses. Lungs:  Clear throughout to auscultation.   No wheezes, crackles, or rhonchi.  Heart:  Regular rate and rhythm; no murmurs. Abdomen:  Soft, nondistended, nontender. No masses, hepatomegaly. No palpable masses.  Normal bowel sounds.    Rectal:  Deferred   Msk:  Symmetrical without gross deformities. Extremities:  Without edema. Neurologic:  Alert and  oriented x 4; grossly normal neurologically. Skin:  Intact without significant lesions or rashes. Psych:  Alert and cooperative. Normal mood and affect.   Venita Lick. Russella Dar  01/29/2023, 8:23 AM See Loretha Stapler, [facility], to contact our on call provider

## 2023-01-30 ENCOUNTER — Telehealth: Payer: Self-pay | Admitting: *Deleted

## 2023-01-30 NOTE — Telephone Encounter (Signed)
  Follow up Call-     01/29/2023    7:39 AM  Call back number  Post procedure Call Back phone  # [phone]  Permission to leave phone message Yes     Patient questions:  Do you have a fever, pain , or abdominal swelling? No. Pain Score  0 *  Have you tolerated food without any problems? Yes.    Have you been able to return to your normal activities? Yes.    Do you have any questions about your discharge instructions: Diet   No. Medications  No. Follow up visit  No.  Do you have questions or concerns about your Care? No.  Actions: * If pain score is 4 or above: No action needed, pain <4.

## 2023-02-04 DIAGNOSIS — G43009 Migraine without aura, not intractable, without status migrainosus: Secondary | ICD-10-CM | POA: Diagnosis not present

## 2023-02-04 DIAGNOSIS — G4700 Insomnia, unspecified: Secondary | ICD-10-CM | POA: Diagnosis not present

## 2023-02-04 DIAGNOSIS — I700 Atherosclerosis of aorta: Secondary | ICD-10-CM | POA: Diagnosis not present

## 2023-02-04 DIAGNOSIS — F0781 Postconcussional syndrome: Secondary | ICD-10-CM | POA: Diagnosis not present

## 2023-02-05 ENCOUNTER — Encounter: Payer: Self-pay | Admitting: Gastroenterology

## 2023-02-10 ENCOUNTER — Encounter: Payer: Self-pay | Admitting: *Deleted

## 2023-02-11 ENCOUNTER — Ambulatory Visit: Payer: Medicare PPO | Admitting: Neurology

## 2023-02-11 ENCOUNTER — Encounter: Payer: Self-pay | Admitting: Neurology

## 2023-02-11 VITALS — BP 126/70 | HR 68 | Ht 61.0 in | Wt 140.0 lb

## 2023-02-11 DIAGNOSIS — G43019 Migraine without aura, intractable, without status migrainosus: Secondary | ICD-10-CM | POA: Diagnosis not present

## 2023-02-11 DIAGNOSIS — G4440 Drug-induced headache, not elsewhere classified, not intractable: Secondary | ICD-10-CM

## 2023-02-11 MED ORDER — NURTEC 75 MG PO TBDP: 75.0000 mg | ORAL_TABLET | Freq: Once | ORAL | 3 refills | Status: DC | PRN

## 2023-02-11 NOTE — Patient Instructions (Addendum)
It was nice to see you again today.  Here is what we discussed today:  I think it is very important that you come off of the Fioricet as it may be part of the problem with your recurrent headaches.  Fioricet can be addicting and causes rebound headaches as well as overuse headaches.  Therefore, I would recommend that you stop it completely. For as needed use for acute migraine: We will try you on Nurtec 75 mg strength: Take 1 pill at onset of migraine headache, no more than 1 pill in 24 hours. May cause sedation and nausea.  You can continue to use ondansetron as needed for nausea. Please schedule your eye exam, I believe they are monitoring your cataracts.  Strain on the eyes can perpetuate headaches.  There may be a component of this causing your worsening headaches as well. Please try to hydrate a little better, we recommend about 4 bottles of water per day, 16.9 ounce size each.   Limit your caffeine as you are but remember you are also getting caffeine whenever you take Fioricet.  Caffeine can cause additional rebound/withdrawal headaches. Continue with your regular exercise regimen. Follow-up in 4 to 6 months to see one of our nurse practitioners. For future consideration, we can reconsider Ubrelvy for acute migraines and for headache prevention we may consider gabapentin.
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
## 2023-03-03 ENCOUNTER — Telehealth: Payer: Self-pay | Admitting: Pharmacy Technician

## 2023-03-03 ENCOUNTER — Other Ambulatory Visit (HOSPITAL_COMMUNITY): Payer: Self-pay

## 2023-03-03 NOTE — Telephone Encounter (Signed)
Pharmacy Patient Advocate Encounter   Received notification from CoverMyMeds that prior authorization for Nurtec 75MG  dispersible tablets is required/requested.   Insurance verification completed.   The patient is insured through [employer] .   Per test claim: PA required; PA submitted to [facility] via CoverMyMeds Key/confirmation #/EOC [id number] Status is pending

## 2023-03-07 ENCOUNTER — Other Ambulatory Visit (HOSPITAL_COMMUNITY): Payer: Self-pay

## 2023-03-07 NOTE — Telephone Encounter (Signed)
Pharmacy Patient Advocate Encounter  Received notification from [facility] that Prior Authorization for Nurtec 75MG  dispersible tablets  has been APPROVED from 05/27/2022 to 05/26/2024. Ran test claim, Copay is $100.00. This test claim was processed through [facility]- copay amounts may vary at other pharmacies due to pharmacy/plan contracts, or as the patient moves through the different stages of their insurance plan.   PA #/Case ID/Reference #: PA Case ID #: [id number]

## 2023-04-18 DIAGNOSIS — J019 Acute sinusitis, unspecified: Secondary | ICD-10-CM | POA: Diagnosis not present

## 2023-04-18 DIAGNOSIS — J029 Acute pharyngitis, unspecified: Secondary | ICD-10-CM | POA: Diagnosis not present

## 2023-04-18 DIAGNOSIS — Z6827 Body mass index (BMI) 27.0-27.9, adult: Secondary | ICD-10-CM | POA: Diagnosis not present

## 2023-04-18 DIAGNOSIS — R053 Chronic cough: Secondary | ICD-10-CM | POA: Diagnosis not present

## 2023-04-18 DIAGNOSIS — R059 Cough, unspecified: Secondary | ICD-10-CM | POA: Diagnosis not present

## 2023-04-28 DIAGNOSIS — J329 Chronic sinusitis, unspecified: Secondary | ICD-10-CM | POA: Diagnosis not present

## 2023-05-20 DIAGNOSIS — J22 Unspecified acute lower respiratory infection: Secondary | ICD-10-CM | POA: Diagnosis not present

## 2023-05-22 ENCOUNTER — Ambulatory Visit
Admission: RE | Admit: 2023-05-22 | Discharge: 2023-05-22 | Disposition: A | Discharge status: Home / Self Care | Payer: Medicare PPO | Source: Ambulatory Visit | Attending: Physician Assistant | Admitting: Physician Assistant

## 2023-05-22 ENCOUNTER — Other Ambulatory Visit: Payer: Self-pay | Admitting: Physician Assistant

## 2023-05-22 DIAGNOSIS — R059 Cough, unspecified: Secondary | ICD-10-CM | POA: Diagnosis not present

## 2023-05-22 DIAGNOSIS — J22 Unspecified acute lower respiratory infection: Secondary | ICD-10-CM

## 2023-06-23 DIAGNOSIS — J209 Acute bronchitis, unspecified: Secondary | ICD-10-CM | POA: Diagnosis not present

## 2023-06-30 DIAGNOSIS — F0781 Postconcussional syndrome: Secondary | ICD-10-CM | POA: Diagnosis not present

## 2023-06-30 DIAGNOSIS — G44309 Post-traumatic headache, unspecified, not intractable: Secondary | ICD-10-CM | POA: Diagnosis not present

## 2023-06-30 DIAGNOSIS — J209 Acute bronchitis, unspecified: Secondary | ICD-10-CM | POA: Diagnosis not present

## 2023-06-30 DIAGNOSIS — R4586 Emotional lability: Secondary | ICD-10-CM | POA: Diagnosis not present

## 2023-07-02 ENCOUNTER — Other Ambulatory Visit: Payer: Self-pay | Admitting: Physician Assistant

## 2023-07-22 ENCOUNTER — Ambulatory Visit
Admission: RE | Admit: 2023-07-22 | Discharge: 2023-07-22 | Disposition: A | Discharge status: Home / Self Care | Payer: Medicare PPO | Source: Ambulatory Visit | Attending: Physician Assistant | Admitting: Physician Assistant

## 2023-07-22 DIAGNOSIS — G319 Degenerative disease of nervous system, unspecified: Secondary | ICD-10-CM | POA: Diagnosis not present

## 2023-07-22 DIAGNOSIS — I6782 Cerebral ischemia: Secondary | ICD-10-CM | POA: Diagnosis not present

## 2023-08-04 DIAGNOSIS — F4325 Adjustment disorder with mixed disturbance of emotions and conduct: Secondary | ICD-10-CM | POA: Diagnosis not present
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
## 2023-08-18 ENCOUNTER — Ambulatory Visit: Payer: Medicare PPO | Admitting: Adult Health

## 2023-08-18 ENCOUNTER — Encounter: Payer: Self-pay | Admitting: Adult Health

## 2023-08-18 VITALS — BP 130/78 | HR 85 | Ht 61.0 in | Wt 142.0 lb

## 2023-08-18 DIAGNOSIS — F0630 Mood disorder due to known physiological condition, unspecified: Secondary | ICD-10-CM | POA: Diagnosis not present

## 2023-08-18 DIAGNOSIS — G43709 Chronic migraine without aura, not intractable, without status migrainosus: Secondary | ICD-10-CM | POA: Diagnosis not present

## 2023-08-18 DIAGNOSIS — Z8669 Personal history of other diseases of the nervous system and sense organs: Secondary | ICD-10-CM | POA: Diagnosis not present

## 2023-08-18 DIAGNOSIS — F0781 Postconcussional syndrome: Secondary | ICD-10-CM | POA: Diagnosis not present

## 2023-08-18 NOTE — Patient Instructions (Addendum)
 Your Plan:  Referral will be placed to Dr. Denyse Amass for further evaluation for post concussive syndrome  Please continue to follow with psychology, may need to consider seeing psychiatry if symptoms persist for possible need of further medication management   You can call and schedule evaluation with ENT at [facility] for evaluation of nasal polyp - office number [phone]     Follow up as needed at this time      Thank you for coming to see [place] at [facility]. I hope we have been able to provide you high quality care today.  You may receive a patient satisfaction survey over the next few weeks. We would appreciate your feedback and comments so that we may continue to improve ourselves and the health of our patients.   Post-Concussion Syndrome  A concussion is a brain injury from a direct hit to the head or body. This hit causes the brain to shake back and forth fast inside the skull. The shaking can damage brain cells and cause chemical changes in the brain. Concussions are normally not life-threatening but can cause serious symptoms. Post-concussion syndrome is when symptoms that happen after a concussion last longer than normal. These symptoms can last from weeks to months. What are the causes? The cause of this condition is not known. It can happen whether your head injury was mild or severe. What increases the risk? You are more likely to get this condition if: You are female. You are a child, teen, or young adult. You have had a head injury before. You have a history of headaches. You have depression or anxiety. You have more than one symptom or severe symptoms when your concussion occurs. You faint or cannot remember the event (have amnesia of the event). What are the signs or symptoms? Symptoms of this condition include: Physical symptoms, such as: Headaches. Tiredness. Dizziness and weakness. Blurred vision and sensitivity to  light. Trouble hearing. Problems with balance. Mental and emotional symptoms, such as: Memory problems and trouble focusing. Trouble falling asleep or staying asleep (insomnia). Feeling irritable. Anxiety or depression. Trouble learning new things. How is this diagnosed? This condition may be diagnosed based on: Your symptoms. A description of your injury. Your medical history. Testing your strength, balance, and nerve function (neurological exam). Your health care provider may order other tests. These may include: Brain imaging, such as a CT scan or an MRI. Memory testing (neuropsychological testing). How is this treated? Treatment for this condition may depend on your symptoms. Symptoms normally go away on their own with time. If you need treatment, it may include: Medicines for headaches, anxiety, depression, and insomnia. Resting your brain and body for a few days after your injury. Rehab therapy, such as: Physical or occupational therapy. This may include exercises to help with balance and dizziness. Mental health counseling. A form of talk therapy called cognitive behavioral therapy (CBT) can be especially helpful. This therapy helps you set goals and follow up on the changes that you make. Speech therapy. Vision therapy. A brain and eye specialist can recommend treatments for vision problems. Follow these instructions at home: Medicines Take over-the-counter and prescription medicines only as told by your health care provider. Avoid opioid prescription pain medicines when getting over a concussion. Activity Limit your mental activities for the first few days after your injury. This may include not doing these things: Homework or work for your job. Complex thinking. Watching TV. Using a computer or phone. Playing memory  games and puzzles. Slowly return to your normal activity level. If a certain activity brings on your symptoms, stop or slow down until you can do the  activity without getting symptoms again. Limit physical activity, such as sports or strenuous activities, for the first few days after a concussion. Slowly return to normal activity as told by your health care provider. Light exercise may be helpful. Rest helps your brain heal. Make sure you: Get plenty of sleep at night. Most adults should get at least 7-9 hours of sleep each night. Rest during the day. Take naps or rest breaks when you feel tired. Do not do high-risk activities that could cause a second concussion, such as riding a bike or playing sports. Having another concussion before the first one has healed can be harmful. General instructions  Do not drink alcohol until your health care provider says that you can. Keep track of how severe your symptoms are and how often they happen. Give this information to your health care provider. Keep all follow-up visits. Your health care provider may need to check you for new or serious symptoms. Where to find more information [employer]: concussionfoundation.org Contact a health care provider if: Your symptoms do not improve. You get injured again. You have unusual behavior changes. Get help right away if: You have a severe or worsening headache. You have weakness or numbness in any part of your body. You vomit repeatedly. You have mental status changes, such as: Confusion. Trouble speaking. Trouble staying awake. Fainting. You have a seizure. These symptoms may be an emergency. Get help right away. Call 911. Do not wait to see if the symptoms will go away. Do not drive yourself to the hospital. Also, get help right away if: You think about hurting yourself or others. Take one of these steps if you feel like you may hurt yourself or others, or have thoughts about taking your own life: Go to your nearest emergency room. Call 911. Call the National Suicide Prevention Lifeline at 406-806-9576 or 988. This is open 24  hours a day. Text the Crisis Text Line at 256-363-4074. This information is not intended to replace advice given to you by your health care provider. Make sure you discuss any questions you have with your health care provider. Document Revised: 10/05/2021 Document Reviewed: 10/05/2021 Elsevier Patient Education  2024 [employer].
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
## 2023-09-03 ENCOUNTER — Encounter: Payer: Self-pay | Admitting: Family Medicine

## 2023-09-03 ENCOUNTER — Ambulatory Visit: Admitting: Family Medicine

## 2023-09-03 VITALS — BP 138/80 | HR 80 | Ht 61.0 in | Wt 143.0 lb

## 2023-09-03 DIAGNOSIS — F411 Generalized anxiety disorder: Secondary | ICD-10-CM

## 2023-09-03 DIAGNOSIS — G43009 Migraine without aura, not intractable, without status migrainosus: Secondary | ICD-10-CM | POA: Diagnosis not present

## 2023-09-03 DIAGNOSIS — F0781 Postconcussional syndrome: Secondary | ICD-10-CM

## 2023-09-03 MED ORDER — TOPIRAMATE ER 25 MG PO CAP24: 25.0000 mg | ORAL_CAPSULE | Freq: Every day | ORAL | 2 refills | Status: AC

## 2023-09-03 MED ORDER — BUSPIRONE HCL 5 MG PO TABS: 5.0000 mg | ORAL_TABLET | Freq: Three times a day (TID) | ORAL | 2 refills | Status: AC | PRN

## 2023-09-03 NOTE — Patient Instructions (Addendum)
 Thank you for coming in today.   Return in about 1 month.   Take trokendi daily for headache prevention. Ok to take with your current medicines.   Take burparone up to 3x daily for anxiety as needed.   We can increase both medicines or just stop them if needed.

## 2023-09-24 DIAGNOSIS — J309 Allergic rhinitis, unspecified: Secondary | ICD-10-CM | POA: Diagnosis not present

## 2023-09-24 DIAGNOSIS — J341 Cyst and mucocele of nose and nasal sinus: Secondary | ICD-10-CM | POA: Diagnosis not present

## 2023-10-02 ENCOUNTER — Encounter: Admitting: Family Medicine

## 2023-10-07 ENCOUNTER — Encounter: Admitting: Family Medicine

## 2023-10-14 DIAGNOSIS — W19XXXA Unspecified fall, initial encounter: Secondary | ICD-10-CM | POA: Diagnosis not present

## 2023-10-14 DIAGNOSIS — S299XXA Unspecified injury of thorax, initial encounter: Secondary | ICD-10-CM | POA: Diagnosis not present

## 2023-11-04 DIAGNOSIS — G43009 Migraine without aura, not intractable, without status migrainosus: Secondary | ICD-10-CM | POA: Diagnosis not present

## 2023-11-04 DIAGNOSIS — R7303 Prediabetes: Secondary | ICD-10-CM | POA: Diagnosis not present

## 2023-11-04 DIAGNOSIS — K219 Gastro-esophageal reflux disease without esophagitis: Secondary | ICD-10-CM | POA: Diagnosis not present

## 2023-11-04 DIAGNOSIS — G4700 Insomnia, unspecified: Secondary | ICD-10-CM | POA: Diagnosis not present

## 2023-11-04 DIAGNOSIS — E782 Mixed hyperlipidemia: Secondary | ICD-10-CM | POA: Diagnosis not present

## 2024-01-29 DIAGNOSIS — G4700 Insomnia, unspecified: Secondary | ICD-10-CM | POA: Diagnosis not present

## 2024-01-29 DIAGNOSIS — Z1331 Encounter for screening for depression: Secondary | ICD-10-CM | POA: Diagnosis not present

## 2024-01-29 DIAGNOSIS — F0781 Postconcussional syndrome: Secondary | ICD-10-CM | POA: Diagnosis not present

## 2024-01-29 DIAGNOSIS — K219 Gastro-esophageal reflux disease without esophagitis: Secondary | ICD-10-CM | POA: Diagnosis not present

## 2024-01-29 DIAGNOSIS — E782 Mixed hyperlipidemia: Secondary | ICD-10-CM | POA: Diagnosis not present

## 2024-01-29 DIAGNOSIS — G43009 Migraine without aura, not intractable, without status migrainosus: Secondary | ICD-10-CM | POA: Diagnosis not present

## 2024-01-29 DIAGNOSIS — R7303 Prediabetes: Secondary | ICD-10-CM | POA: Diagnosis not present

## 2024-01-29 DIAGNOSIS — Z0000 Encounter for general adult medical examination without abnormal findings: Secondary | ICD-10-CM | POA: Diagnosis not present

## 2024-01-29 DIAGNOSIS — S91312A Laceration without foreign body, left foot, initial encounter: Secondary | ICD-10-CM | POA: Diagnosis not present

## 2024-02-13 ENCOUNTER — Telehealth: Payer: Self-pay | Admitting: Family Medicine

## 2024-02-13 NOTE — Telephone Encounter (Signed)
 I am delighted to hear that.  Please let me know if you need refills.

## 2024-02-13 NOTE — Telephone Encounter (Signed)
 Patient called to let Dr Joane know that the medication he put her on back in April has been very helpful and she is feeling much better.  She appreciates Dr Joane and wanted to let him know that he is a very good doctor.

## 2024-02-13 NOTE — Telephone Encounter (Signed)
 Forwarding to Dr. Denyse Amass as Lorain Childes.

## 2024-03-04 DIAGNOSIS — L814 Other melanin hyperpigmentation: Secondary | ICD-10-CM | POA: Diagnosis not present

## 2024-03-04 DIAGNOSIS — X32XXXS Exposure to sunlight, sequela: Secondary | ICD-10-CM | POA: Diagnosis not present

## 2024-03-04 DIAGNOSIS — D485 Neoplasm of uncertain behavior of skin: Secondary | ICD-10-CM | POA: Diagnosis not present

## 2024-03-04 DIAGNOSIS — Z85828 Personal history of other malignant neoplasm of skin: Secondary | ICD-10-CM | POA: Diagnosis not present

## 2024-03-04 DIAGNOSIS — L820 Inflamed seborrheic keratosis: Secondary | ICD-10-CM | POA: Diagnosis not present

## 2024-03-04 DIAGNOSIS — Z808 Family history of malignant neoplasm of other organs or systems: Secondary | ICD-10-CM | POA: Diagnosis not present

## 2024-03-04 DIAGNOSIS — Z08 Encounter for follow-up examination after completed treatment for malignant neoplasm: Secondary | ICD-10-CM | POA: Diagnosis not present

## 2024-03-04 DIAGNOSIS — C44719 Basal cell carcinoma of skin of left lower limb, including hip: Secondary | ICD-10-CM | POA: Diagnosis not present

## 2024-03-12 DIAGNOSIS — J4489 Other specified chronic obstructive pulmonary disease: Secondary | ICD-10-CM | POA: Insufficient documentation

## 2024-03-12 DIAGNOSIS — E119 Type 2 diabetes mellitus without complications: Secondary | ICD-10-CM | POA: Insufficient documentation

## 2024-03-12 DIAGNOSIS — Z043 Encounter for examination and observation following other accident: Secondary | ICD-10-CM | POA: Diagnosis not present

## 2024-03-12 DIAGNOSIS — Y92002 Bathroom of unspecified non-institutional (private) residence single-family (private) house as the place of occurrence of the external cause: Secondary | ICD-10-CM | POA: Diagnosis not present

## 2024-03-12 DIAGNOSIS — W01198A Fall on same level from slipping, tripping and stumbling with subsequent striking against other object, initial encounter: Secondary | ICD-10-CM | POA: Diagnosis not present

## 2024-03-12 DIAGNOSIS — Z7982 Long term (current) use of aspirin: Secondary | ICD-10-CM | POA: Diagnosis not present

## 2024-03-12 DIAGNOSIS — S2232XA Fracture of one rib, left side, initial encounter for closed fracture: Secondary | ICD-10-CM | POA: Diagnosis not present

## 2024-03-12 DIAGNOSIS — R0789 Other chest pain: Secondary | ICD-10-CM | POA: Diagnosis not present

## 2024-03-13 ENCOUNTER — Emergency Department (HOSPITAL_COMMUNITY)
Admission: EM | Admit: 2024-03-13 | Discharge: 2024-03-13 | Disposition: A | Discharge status: Home / Self Care | Attending: Emergency Medicine | Admitting: Emergency Medicine

## 2024-03-13 ENCOUNTER — Encounter (HOSPITAL_COMMUNITY): Payer: Self-pay

## 2024-03-13 ENCOUNTER — Emergency Department (HOSPITAL_COMMUNITY)

## 2024-03-13 ENCOUNTER — Other Ambulatory Visit: Payer: Self-pay

## 2024-03-13 DIAGNOSIS — Z043 Encounter for examination and observation following other accident: Secondary | ICD-10-CM | POA: Diagnosis not present

## 2024-03-13 DIAGNOSIS — S2232XA Fracture of one rib, left side, initial encounter for closed fracture: Secondary | ICD-10-CM

## 2024-03-13 DIAGNOSIS — R0781 Pleurodynia: Secondary | ICD-10-CM | POA: Diagnosis not present

## 2024-03-13 DIAGNOSIS — M25512 Pain in left shoulder: Secondary | ICD-10-CM | POA: Diagnosis not present

## 2024-03-13 LAB — TROPONIN T, HIGH SENSITIVITY: Troponin T High Sensitivity: <15 ng/L (ref 0–19)

## 2024-03-13 MED ORDER — OXYCODONE-ACETAMINOPHEN 5-325 MG PO TABS
1.0000 | ORAL_TABLET | Freq: Once | ORAL | Status: AC
  Administered 2024-03-13: 1 via ORAL
  Filled 2024-03-13: qty 1

## 2024-03-13 MED ORDER — HYDROCODONE-ACETAMINOPHEN 5-325 MG PO TABS: 1.0000 | ORAL_TABLET | Freq: Four times a day (QID) | ORAL | 0 refills | Status: AC | PRN

## 2024-03-13 NOTE — ED Provider Notes (Signed)
 [place] EMERGENCY DEPARTMENT AT [facility] Provider Note   CSN: [id number] Arrival date & time: 03/12/24  2329     Patient presents with: [name] is a 73 y.o. female.   The history is provided by the patient and medical records. No language interpreter was used.  Fall     73 year old female with history of COPD, anxiety, GERD, osteoporosis, asthma, diabetes presenting to the ER from home for evaluation of a fall.  Patient reports last night she accidentally tripped on her rug mat in the bathroom and fell striking the left side of her chest against the edge of the bathtub.  She did not hit her head and no loss of consciousness.  She reported acute onset of sharp pain to the affected area worse with movement or with breathing.  Pain is moderate in severity.  Denies any lightheadedness dizziness nausea or having symptoms shortness of breath.  No specific treatment tried.  She is not on any blood thinning medication.  She denies any significant cardiac history.  Prior to Admission medications   Medication Sig Start Date End Date Taking? Authorizing Provider  Acetaminophen -guaiFENesin  (MUCINEX  COLD & FLU PO) Take by mouth as needed.    [provider]  Albuterol  Sulfate (PROAIR  RESPICLICK) 108 (90 Base) MCG/ACT AEPB Inhale 2 puffs into the lungs every 4 (four) hours as needed.    [provider]  amitriptyline  (ELAVIL ) 50 MG tablet TAKE 2 TABLETS AT BEDTIME. 03/20/21   Norleen Lynwood ORN, MD  aspirin  EC 81 MG tablet Take 1 tablet (81 mg total) by mouth daily. 02/24/14   Norleen Lynwood ORN, MD  atorvastatin  (LIPITOR) 10 MG tablet TAKE 1 TABLET ONCE DAILY. 11/30/19   Norleen Lynwood ORN, MD  Budeson-Glycopyrrol-Formoterol  (BREZTRI  AEROSPHERE) 160-9-4.8 MCG/ACT AERO Inhale 2 puffs into the lungs in the morning and at bedtime. 05/23/22   Gladis Leonor HERO, MD  busPIRone  (BUSPAR ) 5 MG tablet Take 1 tablet (5 mg total) by mouth 3 (three) times daily as needed (anxiety).  09/03/23   Corey, Evan S, MD  butalbital -acetaminophen -caffeine  (FIORICET) 50-325-40 MG tablet TAKE ONE TABLET BY MOUTH TWICE DAILY AS NEEDED FOR HEADACHE 01/08/22   Norleen Lynwood ORN, MD  Calcium  Carbonate-Vit D-Min (CALCIUM  1200 PO) Take 1 capsule by mouth daily.    [provider]  fexofenadine  (ALLEGRA ) 180 MG tablet Take 180 mg by mouth as needed.    [provider]  HYDROcodone  bit-homatropine [facility]) 5-1.5 MG/5ML syrup  11/19/18   [provider]  Lactobacillus-Inulin (CULTURELLE DIGESTIVE DAILY PO) Take by mouth.    [provider]  Omega-3 Fatty Acids (FISH OIL) 1000 MG CAPS Take by mouth. Take 1400    [provider]  Omega-3 Fatty Acids (FISH OIL) 1200 MG CAPS 1 capsule Orally once a day    [provider]  omeprazole  (PRILOSEC) 20 MG capsule TAKE ONE CAPSULE BEFORE MORNING MEAL ONCE DAILY. 02/08/22   Norleen Lynwood ORN, MD  ondansetron (ZOFRAN-ODT) 8 MG disintegrating tablet Take 8 mg by mouth every 8 (eight) hours as needed. 10/28/22   [provider]  Topiramate  ER (TROKENDI  XR) 25 MG CP24 Take 1 capsule (25 mg total) by mouth daily. 09/03/23   Corey, Evan S, MD  triamcinolone cream (KENALOG) 0.1 % as needed. 07/26/21   [provider]    Allergies: Nefazodone and Trazodone and nefazodone    Review of Systems  All other systems reviewed and are negative.   Updated Vital  Signs BP (!) 151/77   Pulse 79   Temp 98 F (36.7 C)   Resp 18   Ht 5' 1 (1.549 m)   Wt 65 kg   SpO2 96%   BMI 27.08 kg/m   Physical Exam Vitals and nursing note reviewed.  Constitutional:      General: She is not in acute distress.    Appearance: She is well-developed.  HENT:     Head: Atraumatic.  Eyes:     Conjunctiva/sclera: Conjunctivae normal.  Cardiovascular:     Rate and Rhythm: Normal rate and regular rhythm.     Pulses: Normal pulses.     Heart sounds: Normal heart sounds.  Pulmonary:     Effort: Pulmonary effort is normal.      Breath sounds: No wheezing, rhonchi or rales.  Chest:     Chest wall: Tenderness (Tenderness to left anterolateral chest wall on palpation without any bruising no crepitus no emphysema noted.) present.  Abdominal:     Palpations: Abdomen is soft.     Tenderness: There is no abdominal tenderness.  Musculoskeletal:     Cervical back: Neck supple.  Skin:    Findings: No rash.  Neurological:     Mental Status: She is alert.  Psychiatric:        Mood and Affect: Mood normal.     (all labs ordered are listed, but only abnormal results are displayed) Labs Reviewed  TROPONIN T, HIGH SENSITIVITY    EKG: None  Date: 03/13/2024  Rate: 71  Rhythm: normal sinus rhythm  QRS Axis: normal  Intervals: normal  ST/T Wave abnormalities: normal  Conduction Disutrbances: none  Narrative Interpretation:   Old EKG Reviewed: No significant changes noted    Radiology: CT Chest Wo Contrast Result Date: 03/13/2024 CLINICAL DATA:  Patient fell in the bathroom and struck left side of chest. EXAM: CT CHEST WITHOUT CONTRAST TECHNIQUE: Multidetector CT imaging of the chest was performed following the standard protocol without IV contrast. RADIATION DOSE REDUCTION: This exam was performed according to the departmental dose-optimization program which includes automated exposure control, adjustment of the mA and/or kV according to patient size and/or use of iterative reconstruction technique. COMPARISON:  05/29/2022 FINDINGS: Cardiovascular: The heart size is normal. No substantial pericardial effusion. Mild atherosclerotic calcification is noted in the wall of the thoracic aorta. Mediastinum/Nodes: No mediastinal lymphadenopathy. No evidence for gross hilar lymphadenopathy although assessment is limited by the lack of intravenous contrast on the current study. The esophagus has normal imaging features. There is no axillary lymphadenopathy. Lungs/Pleura: The lungs are clear without focal pneumonia, edema,  pneumothorax or pleural effusion. Subsegmental atelectasis noted in the dependent lung bases. Upper Abdomen: Visualized portion of the upper abdomen shows no acute findings. Musculoskeletal: No worrisome lytic or sclerotic osseous abnormality. Subtle cortical buckling identified anterior left eighth rib (image 122/4) new since prior of 05/29/2022 and suspicious for acute nondisplaced fracture. Sclerotic focus in the T3 vertebral body is stable since prior compatible with benign etiology such as bone island. IMPRESSION: 1. Subtle cortical buckling identified anterior left eighth rib, new since prior of 05/29/2022 and suspicious for acute nondisplaced fracture. No other acute rib fracture evident. 2. No other acute findings in the chest. Specifically, no pneumothorax or pleural effusion. 3.  Aortic Atherosclerosis (ICD10-I70.0). Electronically Signed   By: Camellia Candle M.D.   On: 03/13/2024 08:41   DG Ribs Unilateral W/Chest Left Result Date: 03/13/2024 EXAM: MULTIPLE VIEW(S) XRAY OF THE LEFT RIBS AND CHEST 03/13/2024 12:29:00  AM COMPARISON: None available. CLINICAL HISTORY: fall. Pt slipped on her rug in her bathroom and hit her bathtub. Pt c/o left anterior cp, left posterior rib pain (marked with bb) and posterior shoulder pain. FINDINGS: BONES: No acute displaced rib fracture. LUNGS AND PLEURA: No consolidation or pulmonary edema. No pleural effusion or pneumothorax. HEART AND MEDIASTINUM: No acute abnormality of the cardiac and mediastinal silhouettes. IMPRESSION: 1. No displaced rib fracture. Electronically signed by: Norman Gatlin MD 03/13/2024 12:48 AM EDT RP Workstation: [id number]     Procedures   Medications Ordered in the ED  oxyCODONE-acetaminophen  (PERCOCET/ROXICET) 5-325 MG per tablet 1 tablet (1 tablet Oral Given 03/13/24 0740)                                    Medical Decision Making Amount and/or Complexity of Data Reviewed Radiology: ordered. ECG/medicine tests:  ordered.  Risk Prescription drug management.   BP (!) 151/77   Pulse 79   Temp 98 F (36.7 C)   Resp 18   Ht 5' 1 (1.549 m)   Wt 65 kg   SpO2 96%   BMI 27.08 kg/m   53:70 AM  73 year old female with history of COPD, anxiety, GERD, osteoporosis, asthma, diabetes presenting to the ER from home for evaluation of a fall.  Patient reports last night she accidentally tripped on her rug mat in the bathroom and fell striking the left side of her chest against the edge of the bathtub.  She did not hit her head and no loss of consciousness.  She reported acute onset of sharp pain to the affected area worse with movement or with breathing.  Pain is moderate in severity.  Denies any lightheadedness dizziness nausea or having symptoms shortness of breath.  No specific treatment tried.  She is not on any blood thinning medication.  She denies any significant cardiac history.  Exam notable for reproducible tenderness to left anterolateral chest wall but no bruising crepitus or emphysema noted.  Initial ribs x-ray on the left side did not show any displaced rib fracture.  Given location of her injury, will obtain troponin, EKG, and chest CT for further assessment and to rule out pulmonary or cardiac contusion or missed rib fracture.  Pain medication given.  -Labs ordered, independently viewed and interpreted by me.  Labs remarkable for normal: -The patient was maintained on a cardiac monitor.  I personally viewed and interpreted the cardiac monitored which showed an underlying rhythm of: Normal sinus rhythm -Imaging independently viewed and interpreted by me and I agree with radiologist's interpretation.  Result remarkable for initial x-ray of the chest did not show any concerning rib fracture abnormal finding.  Chest CT subsequently obtained and demonstrated an acute nondisplaced fracture involving the left eighth rib.  This is consistent with patient presentation -This patient presents to the ED for  concern of chest wall injury, this involves an extensive number of treatment options, and is a complaint that carries with it a high risk of complications and morbidity.  The differential diagnosis includes rib fracture, pulmonary contusion, cardiac contusion, traumatic pneumothorax -Co morbidities that complicate the patient evaluation includes osteoporosis, COPD, anxiety -Treatment includes Percocet -Reevaluation of the patient after these medicines showed that the patient improved -PCP office notes or outside notes reviewed -Discussion with attending Dr. Patsey -Escalation to admission/observation considered: patients feels much better, is comfortable with discharge, and will follow up with PCP.  Patient was provided with definitive treatment for wrist fracture care which includes opiate pain medication, incentive spirometry, as well as return precaution -Prescription medication considered, patient comfortable with vicodin -Social Determinant of Health considered which includes depression       Final diagnoses:  Closed fracture of one rib of left side, initial encounter    ED Discharge Orders          Ordered    HYDROcodone -acetaminophen  (NORCO/VICODIN) 5-325 MG tablet  Every 6 hours PRN        03/13/24 0915               Nivia Colon, PA-C 03/13/24 [id number]    Patsey Lot, MD 03/13/24 1528

## 2024-03-13 NOTE — ED Triage Notes (Signed)
 Pt to ED from home following a mechanical fall. Pt fell in her bathroom and struck her L sided ribcage on the bathtub. Rates pain 9/10. Denies any LOC is not taking any thinners. VSS, NADN.

## 2024-03-13 NOTE — Discharge Instructions (Addendum)
 You have been diagnosed with a broken rib, the eighth rib on the left side from your fall.  Please use incentive spirometer as instructed at least once hourly for the next 1 to 2 weeks to decrease risk of developing pneumonia.  You may take Vicodin as needed for pain but be aware it can cause drowsiness.  Hold a pillow against your chest when you cough to decrease discomfort.  Your bony injury will likely heal in 4 to 6 weeks.

## 2024-03-25 DIAGNOSIS — S2232XA Fracture of one rib, left side, initial encounter for closed fracture: Secondary | ICD-10-CM | POA: Diagnosis not present

## 2024-04-12 ENCOUNTER — Other Ambulatory Visit (HOSPITAL_BASED_OUTPATIENT_CLINIC_OR_DEPARTMENT_OTHER): Payer: Self-pay | Admitting: Physician Assistant

## 2024-04-12 DIAGNOSIS — Z13820 Encounter for screening for osteoporosis: Secondary | ICD-10-CM

## 2024-04-12 DIAGNOSIS — Z1231 Encounter for screening mammogram for malignant neoplasm of breast: Secondary | ICD-10-CM

## 2024-10-14 ENCOUNTER — Other Ambulatory Visit (HOSPITAL_BASED_OUTPATIENT_CLINIC_OR_DEPARTMENT_OTHER)
